# Patient Record
Sex: Female | Born: 1970 | Race: Asian | Hispanic: No | Marital: Married | Smoking: Never smoker
Health system: Southern US, Community
[De-identification: ages and names within clinical notes are randomized; demographics above are authoritative.]

## PROBLEM LIST (undated history)

## (undated) DIAGNOSIS — G43909 Migraine, unspecified, not intractable, without status migrainosus: Secondary | ICD-10-CM

## (undated) DIAGNOSIS — D649 Anemia, unspecified: Secondary | ICD-10-CM

## (undated) DIAGNOSIS — Z8619 Personal history of other infectious and parasitic diseases: Secondary | ICD-10-CM

## (undated) DIAGNOSIS — E785 Hyperlipidemia, unspecified: Secondary | ICD-10-CM

## (undated) DIAGNOSIS — T4145XA Adverse effect of unspecified anesthetic, initial encounter: Secondary | ICD-10-CM

## (undated) DIAGNOSIS — K589 Irritable bowel syndrome without diarrhea: Secondary | ICD-10-CM

## (undated) DIAGNOSIS — I10 Essential (primary) hypertension: Secondary | ICD-10-CM

## (undated) DIAGNOSIS — F909 Attention-deficit hyperactivity disorder, unspecified type: Secondary | ICD-10-CM

## (undated) DIAGNOSIS — N390 Urinary tract infection, site not specified: Secondary | ICD-10-CM

## (undated) DIAGNOSIS — T7840XA Allergy, unspecified, initial encounter: Secondary | ICD-10-CM

## (undated) DIAGNOSIS — J45909 Unspecified asthma, uncomplicated: Secondary | ICD-10-CM

## (undated) DIAGNOSIS — M1990 Unspecified osteoarthritis, unspecified site: Secondary | ICD-10-CM

## (undated) HISTORY — DX: Migraine, unspecified, not intractable, without status migrainosus: G43.909

## (undated) HISTORY — DX: Urinary tract infection, site not specified: N39.0

## (undated) HISTORY — DX: Personal history of other infectious and parasitic diseases: Z86.19

## (undated) HISTORY — DX: Allergy, unspecified, initial encounter: T78.40XA

## (undated) HISTORY — PX: ANAL FISSURE REPAIR: SHX2312

## (undated) HISTORY — DX: Essential (primary) hypertension: I10

## (undated) HISTORY — DX: Unspecified osteoarthritis, unspecified site: M19.90

## (undated) HISTORY — DX: Anemia, unspecified: D64.9

## (undated) HISTORY — PX: GYNECOLOGIC CRYOSURGERY: SHX857

## (undated) HISTORY — DX: Irritable bowel syndrome, unspecified: K58.9

## (undated) HISTORY — DX: Attention-deficit hyperactivity disorder, unspecified type: F90.9

## (undated) HISTORY — PX: WISDOM TOOTH EXTRACTION: SHX21

## (undated) HISTORY — PX: EYE SURGERY: SHX253

---
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
## 2009-08-04 LAB — HM MAMMOGRAPHY: HM Mammogram: NEGATIVE

## 2011-12-12 ENCOUNTER — Encounter: Payer: Self-pay | Admitting: Internal Medicine

## 2012-01-08 ENCOUNTER — Other Ambulatory Visit: Payer: Self-pay | Admitting: Obstetrics & Gynecology

## 2012-01-08 DIAGNOSIS — Z1231 Encounter for screening mammogram for malignant neoplasm of breast: Secondary | ICD-10-CM

## 2012-01-09 LAB — HM PAP SMEAR

## 2012-01-14 ENCOUNTER — Encounter: Payer: Self-pay | Admitting: Internal Medicine

## 2012-01-14 ENCOUNTER — Ambulatory Visit (INDEPENDENT_AMBULATORY_CARE_PROVIDER_SITE_OTHER): Payer: 59 | Admitting: Internal Medicine

## 2012-01-14 VITALS — BP 112/70 | HR 72 | Temp 98.1°F | Resp 18 | Ht 60.0 in | Wt 156.0 lb

## 2012-01-14 DIAGNOSIS — Z0000 Encounter for general adult medical examination without abnormal findings: Secondary | ICD-10-CM

## 2012-01-14 DIAGNOSIS — J45909 Unspecified asthma, uncomplicated: Secondary | ICD-10-CM

## 2012-01-14 DIAGNOSIS — J4540 Moderate persistent asthma, uncomplicated: Secondary | ICD-10-CM | POA: Insufficient documentation

## 2012-01-14 LAB — COMPREHENSIVE METABOLIC PANEL
Albumin: 3.9 g/dL (ref 3.5–5.2)
Alkaline Phosphatase: 50 U/L (ref 39–117)
BUN: 5 mg/dL — ABNORMAL LOW (ref 6–23)
Glucose, Bld: 82 mg/dL (ref 70–99)
Potassium: 3.9 mEq/L (ref 3.5–5.1)

## 2012-01-14 LAB — LIPID PANEL
HDL: 41.2 mg/dL (ref >39.00)
Triglycerides: 256 mg/dL — ABNORMAL HIGH (ref 0.0–149.0)

## 2012-01-14 LAB — TSH: TSH: 1.89 u[IU]/mL (ref 0.35–5.50)

## 2012-01-14 LAB — LDL CHOLESTEROL, DIRECT: Direct LDL: 123.9 mg/dL

## 2012-01-14 MED ORDER — LEVALBUTEROL TARTRATE 45 MCG/ACT IN AERO: 1.0000 | INHALATION_SPRAY | RESPIRATORY_TRACT | Status: DC | PRN

## 2012-01-14 NOTE — Progress Notes (Signed)
  Subjective:    Patient ID: Marissa Horn, female    DOB: September 06, 1970, 41 y.o.   MRN: [id number]  HPI  41 year old who is seen today to establish with our practice. For past medical history is fairly unremarkable. She does have a history of mild episodic asthma. She states that she has one or 2 episodes per year that requires inhalational bronchodilators and off on steroid therapy. She has had a recent gynecologic examination and was noted to have iron deficiency anemia secondary to menorrhagia She has a prior history of migraine headaches but this has not been bothersome in some time. She has a history of MVP and occasional palpitations. She is a gravida 3 para 2 abortus 1 with 2 sons age 40 and 99 Social history married nonsmoker she is a Museum/gallery exhibitions officer by training. Her husband is employed with [employer] as a pediatric ID physician Family history both parents are age approximately 56 with dyslipidemia father has coronary artery disease status post stent placement one brother also dyslipidemia one sister with hypothyroidism    Review of Systems  Constitutional: Negative.   HENT: Negative for hearing loss, congestion, sore throat, rhinorrhea, dental problem, sinus pressure and tinnitus.   Eyes: Negative for pain, discharge and visual disturbance.  Respiratory: Negative for cough and shortness of breath.   Cardiovascular: Positive for palpitations. Negative for chest pain and leg swelling.  Gastrointestinal: Negative for nausea, vomiting, abdominal pain, diarrhea, constipation, blood in stool and abdominal distention.  Genitourinary: Negative for dysuria, urgency, frequency, hematuria, flank pain, vaginal bleeding, vaginal discharge, difficulty urinating, vaginal pain and pelvic pain.  Musculoskeletal: Negative for joint swelling, arthralgias and gait problem.  Skin: Negative for rash.  Neurological: Negative for dizziness, syncope, speech difficulty, weakness, numbness and headaches.    Hematological: Negative for adenopathy.  Psychiatric/Behavioral: Negative for behavioral problems, dysphoric mood and agitation. The patient is not nervous/anxious.        Objective:   Physical Exam  Constitutional: She is oriented to person, place, and time. She appears well-developed and well-nourished.       Weight 156 Blood pressure low normal  HENT:  Head: Normocephalic.  Right Ear: External ear normal.  Left Ear: External ear normal.  Mouth/Throat: Oropharynx is clear and moist.  Eyes: Conjunctivae and EOM are normal. Pupils are equal, round, and reactive to light.  Neck: Normal range of motion. Neck supple. No thyromegaly present.  Cardiovascular: Normal rate, regular rhythm, normal heart sounds and intact distal pulses.   No murmur heard. Pulmonary/Chest: Effort normal and breath sounds normal.  Abdominal: Soft. Bowel sounds are normal. She exhibits no mass. There is no tenderness.  Musculoskeletal: Normal range of motion.  Lymphadenopathy:    She has no cervical adenopathy.  Neurological: She is alert and oriented to person, place, and time.  Skin: Skin is warm and dry. No rash noted.  Psychiatric: She has a normal mood and affect. Her behavior is normal.          Assessment & Plan:   Preventive health examination Mild episodic asthma. We'll continue Xopenex when necessary Mitral prolapse with occasional palpitations. History of essential tremor History of iron deficiency anemia History of mild dyslipidemia  Will check laboratory studies to include a lipid profile Exercise regimen and modest weight loss encouraged

## 2012-01-14 NOTE — Patient Instructions (Signed)
It is important that you exercise regularly, at least 20 minutes 3 to 4 times per week.  If you develop chest pain or shortness of breath seek  medical attention.  You need to lose weight.  Consider a lower calorie diet and regular exercise.  Call or return to clinic prn if these symptoms worsen or fail to improve as anticipated.  

## 2012-01-23 ENCOUNTER — Ambulatory Visit
Admission: RE | Admit: 2012-01-23 | Discharge: 2012-01-23 | Disposition: A | Discharge status: Home / Self Care | Payer: 59 | Source: Ambulatory Visit | Attending: Obstetrics & Gynecology | Admitting: Obstetrics & Gynecology

## 2012-01-23 DIAGNOSIS — Z1231 Encounter for screening mammogram for malignant neoplasm of breast: Secondary | ICD-10-CM

## 2012-01-30 LAB — HM MAMMOGRAPHY: HM Mammogram: NORMAL

## 2012-03-12 ENCOUNTER — Telehealth: Payer: Self-pay | Admitting: Internal Medicine

## 2012-03-12 NOTE — Telephone Encounter (Signed)
Pt called and said that she never rcvd a call re: her labs results from 01/14/12. Pls call.

## 2012-03-15 NOTE — Telephone Encounter (Signed)
Spoke with pt- informed labs stable - copy sent to pt as requested    [address] [place] , [place] [phone]

## 2012-04-21 ENCOUNTER — Encounter (HOSPITAL_COMMUNITY): Payer: Self-pay | Admitting: Pharmacist

## 2012-04-27 ENCOUNTER — Encounter (HOSPITAL_COMMUNITY): Payer: Self-pay

## 2012-04-27 ENCOUNTER — Encounter (HOSPITAL_COMMUNITY)
Admission: RE | Admit: 2012-04-27 | Discharge: 2012-04-27 | Disposition: A | Discharge status: Home / Self Care | Payer: 59 | Source: Ambulatory Visit | Attending: Obstetrics & Gynecology | Admitting: Obstetrics & Gynecology

## 2012-04-27 HISTORY — DX: Unspecified asthma, uncomplicated: J45.909

## 2012-04-27 HISTORY — DX: Adverse effect of unspecified anesthetic, initial encounter: T41.45XA

## 2012-04-27 LAB — CBC
MCH: 26.5 pg (ref 26.0–34.0)
MCHC: 31.3 g/dL (ref 30.0–36.0)
MCV: 84.7 fL (ref 78.0–100.0)
Platelets: 335 10*3/uL (ref 150–400)
RDW: 15.1 % (ref 11.5–15.5)

## 2012-04-27 NOTE — Patient Instructions (Addendum)
Your procedure is scheduled on:05/04/12  Enter through the Main Entrance at :6AM Pick up desk phone and dial [phone] and inform us of your arrival.  Please call [phone] if you have any problems the morning of surgery.  Remember: Do not eat after midnight monday Do not drink after:midnight Monday  Take these meds the morning of surgery with a sip of water:none....bring inhaler to hospital  DO NOT wear jewelry, eye make-up, lipstick,body lotion, or dark fingernail polish. Do not shave for 48 hours prior to surgery.  If you are to be admitted after surgery, leave suitcase in car until your room has been assigned. Patients discharged on the day of surgery will not be allowed to drive home.   Remember to use your Hibiclens as instructed.

## 2012-05-03 DIAGNOSIS — N920 Excessive and frequent menstruation with regular cycle: Secondary | ICD-10-CM | POA: Diagnosis present

## 2012-05-03 DIAGNOSIS — N840 Polyp of corpus uteri: Secondary | ICD-10-CM | POA: Diagnosis present

## 2012-05-03 NOTE — H&P (Signed)
Marissa Horn is an 41 y.o. female G3P2 married [place] female with history of menorrhagia that has been treated unsuccessfully with both oral contraceptives and a Mirena IUD.  She underwent an in office ultrasound on 01/21/12 as well as an endometrial biopsy.  The biopsy showed benign tissue.  She has decided to proceed with definitve treatment but has been counseled about alternative treatments including progesterone use and  endometrial ablation.  Risks, benefits have all been discussed and are documented in my office chart.  Patient also has history of overactive bladder and has seen Dr Sherron Monday for consultation.  She is now on oral medications with significant improvement in symptoms.  She also wanted to consider abdominoplasty in combination with the hysterectomy. She has seen Dr. Etter Sjogren who will be performing a combined surgcal case.  Pertinent Gynecological History: Menses: regular but heavy Bleeding: menorrhagia Contraception: condoms DES exposure: denies Blood transfusions: none Sexually transmitted diseases: no past history Previous GYN Procedures: IUD placement and removal  Last mammogram: normal Date: 7/13 Last pap: normal Date: 6/13 OB History: G3, P2   Menstrual History: Menarche age: 75 No LMP recorded.    Past Medical History  Diagnosis Date  . Allergy   . Arthritis   . Hyperlipidemia   . UTI (lower urinary tract infection)   . History of chicken pox   . Migraine   . Asthma     inhaler use as needed   . Shortness of breath     when having asthma attack  . Complication of anesthesia 1990    muscle relaxant caused lung to collapse  . Difficult intubation 1990    Past Surgical History  Procedure Date  . Eye surgery   . Anal fissure repair   . Wisdom tooth extraction     Family History  Problem Relation Age of Onset  . Arthritis Mother   . Hyperlipidemia Mother   . Arthritis Father   . Hyperlipidemia Father   . Heart disease Father   . Thyroid  disease Sister   . Hyperlipidemia Brother     Social History:  reports that she has never smoked. She has never used smokeless tobacco. She reports that she does not drink alcohol or use illicit drugs.  Allergies:  Allergies  Allergen Reactions  . Scopolamine Anaphylaxis  . Biaxin (Clarithromycin) Nausea And Vomiting    Projectile vomiting  . Tamiflu (Oseltamivir Phosphate) Nausea And Vomiting    Projectile vomiting  . Asa (Aspirin) Rash    Pt takes ibuprofen without problems    No prescriptions prior to admission    Review of Systems  Constitutional: Negative for fever and chills.  Eyes: Negative for blurred vision.  Respiratory: Negative for cough.   Cardiovascular: Negative for chest pain.  Gastrointestinal: Negative for heartburn, nausea, vomiting and abdominal pain.  Genitourinary: Negative for dysuria and urgency.  Musculoskeletal: Negative for myalgias.  Skin: Negative for itching and rash.  Neurological: Negative for dizziness and headaches.  Endo/Heme/Allergies: Does not bruise/bleed easily.  Psychiatric/Behavioral: Negative for depression.    There were no vitals taken for this visit. Physical Exam  Vitals reviewed. Constitutional: She is oriented to person, place, and time. She appears well-developed.  HENT:  Head: Normocephalic and atraumatic.  Neck: Normal range of motion. Neck supple.  Cardiovascular: Normal rate, regular rhythm and normal heart sounds.   Respiratory: Effort normal and breath sounds normal.  GI: Soft. Bowel sounds are normal. She exhibits no distension and no mass. There is no tenderness.  There is no rebound and no guarding.  Musculoskeletal: Normal range of motion.  Neurological: She is alert and oriented to person, place, and time.  Skin: Skin is warm and dry.  Psychiatric: She has a normal mood and affect.    No results found for this or any previous visit (from the past 24 hour(s)).  No results found.  Assessment/Plan: Jacci  Amos is a 41 year old G3P2 MWF with menorrhagia here for definitive treatment with robotic assisted TLH/possible BSO.  Risks and benefits have been discussed and are all documented in my office charge.  Patient here and ready to proceed.  Dr. Odis Luster will also be performing an abdominoplasty at the same time.  Valentina Shaggy [name] 05/03/2012, 10:20 PM

## 2012-05-04 ENCOUNTER — Ambulatory Visit (HOSPITAL_COMMUNITY)
Admission: RE | Admit: 2012-05-04 | Discharge: 2012-05-05 | Disposition: A | Discharge status: Home / Self Care | Payer: 59 | Source: Ambulatory Visit | Attending: Obstetrics & Gynecology | Admitting: Obstetrics & Gynecology

## 2012-05-04 ENCOUNTER — Ambulatory Visit (HOSPITAL_COMMUNITY): Payer: 59 | Admitting: Anesthesiology

## 2012-05-04 ENCOUNTER — Ambulatory Visit (HOSPITAL_COMMUNITY)
Admission: RE | Admit: 2012-05-04 | Discharge: 2012-05-04 | Disposition: A | Discharge status: Home / Self Care | Payer: Self-pay | Source: Ambulatory Visit | Attending: Plastic Surgery | Admitting: Plastic Surgery

## 2012-05-04 ENCOUNTER — Encounter (HOSPITAL_COMMUNITY): Payer: Self-pay

## 2012-05-04 ENCOUNTER — Encounter (HOSPITAL_COMMUNITY): Payer: Self-pay | Admitting: Anesthesiology

## 2012-05-04 ENCOUNTER — Encounter (HOSPITAL_COMMUNITY)
Admission: RE | Disposition: A | Discharge status: Home / Self Care | Payer: Self-pay | Source: Ambulatory Visit | Attending: Obstetrics & Gynecology

## 2012-05-04 DIAGNOSIS — N3281 Overactive bladder: Secondary | ICD-10-CM | POA: Diagnosis present

## 2012-05-04 DIAGNOSIS — J45909 Unspecified asthma, uncomplicated: Secondary | ICD-10-CM

## 2012-05-04 DIAGNOSIS — N840 Polyp of corpus uteri: Secondary | ICD-10-CM | POA: Diagnosis present

## 2012-05-04 DIAGNOSIS — L909 Atrophic disorder of skin, unspecified: Secondary | ICD-10-CM | POA: Insufficient documentation

## 2012-05-04 DIAGNOSIS — D649 Anemia, unspecified: Secondary | ICD-10-CM | POA: Diagnosis present

## 2012-05-04 DIAGNOSIS — N920 Excessive and frequent menstruation with regular cycle: Secondary | ICD-10-CM | POA: Diagnosis present

## 2012-05-04 DIAGNOSIS — L919 Hypertrophic disorder of the skin, unspecified: Secondary | ICD-10-CM | POA: Insufficient documentation

## 2012-05-04 HISTORY — PX: OTHER SURGICAL HISTORY: SHX169

## 2012-05-04 HISTORY — PX: ABDOMINAL HYSTERECTOMY: SHX81

## 2012-05-04 HISTORY — PX: ABDOMINOPLASTY: SHX5355

## 2012-05-04 SURGERY — ROBOTIC ASSISTED TOTAL HYSTERECTOMY
Anesthesia: General | Site: Abdomen | Wound class: Clean Contaminated

## 2012-05-04 MED ORDER — ROCURONIUM BROMIDE 100 MG/10ML IV SOLN
INTRAVENOUS | Status: DC | PRN
  Administered 2012-05-04: 30 mg via INTRAVENOUS
  Administered 2012-05-04: 50 mg via INTRAVENOUS

## 2012-05-04 MED ORDER — FENTANYL CITRATE 0.05 MG/ML IJ SOLN
25.0000 ug | INTRAMUSCULAR | Status: DC | PRN
  Administered 2012-05-04 (×2): 50 ug via INTRAVENOUS
  Administered 2012-05-04 (×2): 25 ug via INTRAVENOUS

## 2012-05-04 MED ORDER — EPHEDRINE SULFATE 50 MG/ML IJ SOLN
INTRAMUSCULAR | Status: DC | PRN
  Administered 2012-05-04: 10 mg via INTRAVENOUS

## 2012-05-04 MED ORDER — ROPIVACAINE HCL 5 MG/ML IJ SOLN
INTRAMUSCULAR | Status: DC | PRN
  Administered 2012-05-04: 80 mL

## 2012-05-04 MED ORDER — PROPOFOL 10 MG/ML IV EMUL
INTRAVENOUS | Status: DC | PRN
  Administered 2012-05-04: 200 mg via INTRAVENOUS

## 2012-05-04 MED ORDER — LACTATED RINGERS IR SOLN
Status: DC | PRN
  Administered 2012-05-04: 3000 mL

## 2012-05-04 MED ORDER — FENTANYL CITRATE 0.05 MG/ML IJ SOLN
INTRAMUSCULAR | Status: DC | PRN
  Administered 2012-05-04: 50 ug via INTRAVENOUS
  Administered 2012-05-04: 100 ug via INTRAVENOUS
  Administered 2012-05-04 (×2): 50 ug via INTRAVENOUS

## 2012-05-04 MED ORDER — LEVALBUTEROL TARTRATE 45 MCG/ACT IN AERO: 1.0000 | INHALATION_SPRAY | RESPIRATORY_TRACT | Status: DC | PRN

## 2012-05-04 MED ORDER — PANTOPRAZOLE SODIUM 40 MG IV SOLR
40.0000 mg | Freq: Every day | INTRAVENOUS | Status: DC
  Administered 2012-05-04: 40 mg via INTRAVENOUS
  Filled 2012-05-04 (×2): qty 40

## 2012-05-04 MED ORDER — INDIGOTINDISULFONATE SODIUM 8 MG/ML IJ SOLN
INTRAMUSCULAR | Status: AC
  Filled 2012-05-04: qty 5

## 2012-05-04 MED ORDER — INDIGOTINDISULFONATE SODIUM 8 MG/ML IJ SOLN
INTRAMUSCULAR | Status: DC | PRN
  Administered 2012-05-04: 40 mg via INTRAVENOUS

## 2012-05-04 MED ORDER — CEFAZOLIN SODIUM-DEXTROSE 2-3 GM-% IV SOLR: 2.0000 g | INTRAVENOUS | Status: DC

## 2012-05-04 MED ORDER — FENTANYL CITRATE 0.05 MG/ML IJ SOLN
INTRAMUSCULAR | Status: AC
  Administered 2012-05-04: 50 ug via INTRAVENOUS
  Filled 2012-05-04: qty 2

## 2012-05-04 MED ORDER — CEFAZOLIN SODIUM-DEXTROSE 2-3 GM-% IV SOLR
INTRAVENOUS | Status: AC
  Administered 2012-05-04: 2 g via INTRAVENOUS
  Filled 2012-05-04: qty 50

## 2012-05-04 MED ORDER — ONDANSETRON HCL 4 MG/2ML IJ SOLN: 4.0000 mg | Freq: Four times a day (QID) | INTRAMUSCULAR | Status: DC | PRN

## 2012-05-04 MED ORDER — LACTATED RINGERS IV SOLN
INTRAVENOUS | Status: DC
  Administered 2012-05-04: 10:00:00 via INTRAVENOUS
  Administered 2012-05-04: 1000 mL via INTRAVENOUS
  Administered 2012-05-04: 08:00:00 via INTRAVENOUS

## 2012-05-04 MED ORDER — HYDROMORPHONE HCL PF 1 MG/ML IJ SOLN
INTRAMUSCULAR | Status: AC
  Filled 2012-05-04: qty 1

## 2012-05-04 MED ORDER — HYDROMORPHONE HCL PF 1 MG/ML IJ SOLN
INTRAMUSCULAR | Status: DC | PRN
  Administered 2012-05-04: 1 mg via INTRAVENOUS

## 2012-05-04 MED ORDER — SODIUM CHLORIDE 0.9 % IJ SOLN: 9.0000 mL | INTRAMUSCULAR | Status: DC | PRN

## 2012-05-04 MED ORDER — SIMETHICONE 80 MG PO CHEW: 80.0000 mg | CHEWABLE_TABLET | Freq: Four times a day (QID) | ORAL | Status: DC | PRN

## 2012-05-04 MED ORDER — DIPHENHYDRAMINE HCL 12.5 MG/5ML PO ELIX: 12.5000 mg | ORAL_SOLUTION | Freq: Four times a day (QID) | ORAL | Status: DC | PRN

## 2012-05-04 MED ORDER — PROPOFOL 10 MG/ML IV EMUL
INTRAVENOUS | Status: AC
  Filled 2012-05-04: qty 20

## 2012-05-04 MED ORDER — NEOSTIGMINE METHYLSULFATE 1 MG/ML IJ SOLN
INTRAMUSCULAR | Status: AC
  Filled 2012-05-04: qty 10

## 2012-05-04 MED ORDER — OXYCODONE-ACETAMINOPHEN 5-325 MG PO TABS: 1.0000 | ORAL_TABLET | ORAL | Status: DC | PRN

## 2012-05-04 MED ORDER — MENTHOL 3 MG MT LOZG: 1.0000 | LOZENGE | OROMUCOSAL | Status: DC | PRN

## 2012-05-04 MED ORDER — PHENYLEPHRINE 40 MCG/ML (10ML) SYRINGE FOR IV PUSH (FOR BLOOD PRESSURE SUPPORT)
PREFILLED_SYRINGE | INTRAVENOUS | Status: AC
  Filled 2012-05-04: qty 5

## 2012-05-04 MED ORDER — DIPHENHYDRAMINE HCL 50 MG/ML IJ SOLN: 12.5000 mg | Freq: Four times a day (QID) | INTRAMUSCULAR | Status: DC | PRN

## 2012-05-04 MED ORDER — NALOXONE HCL 0.4 MG/ML IJ SOLN: 0.4000 mg | INTRAMUSCULAR | Status: DC | PRN

## 2012-05-04 MED ORDER — MIDAZOLAM HCL 2 MG/2ML IJ SOLN
INTRAMUSCULAR | Status: AC
  Filled 2012-05-04: qty 2

## 2012-05-04 MED ORDER — ENOXAPARIN SODIUM 40 MG/0.4ML ~~LOC~~ SOLN
40.0000 mg | SUBCUTANEOUS | Status: DC
  Administered 2012-05-05: 40 mg via SUBCUTANEOUS
  Filled 2012-05-04 (×3): qty 0.4

## 2012-05-04 MED ORDER — HYDROMORPHONE 0.3 MG/ML IV SOLN
INTRAVENOUS | Status: DC
Start: 2012-05-04 — End: 2012-05-05
  Administered 2012-05-04: 0.3 mg via INTRAVENOUS
  Administered 2012-05-04: 15:00:00 via INTRAVENOUS
  Administered 2012-05-04: 1.8 mg via INTRAVENOUS
  Administered 2012-05-05 (×2): 0.6 mg via INTRAVENOUS
  Filled 2012-05-04: qty 25

## 2012-05-04 MED ORDER — DEXTROSE-NACL 5-0.45 % IV SOLN
INTRAVENOUS | Status: DC
  Administered 2012-05-05: 03:00:00 via INTRAVENOUS

## 2012-05-04 MED ORDER — ACETAMINOPHEN 10 MG/ML IV SOLN: 1000.0000 mg | Freq: Once | INTRAVENOUS | Status: DC

## 2012-05-04 MED ORDER — CEFAZOLIN SODIUM 1-5 GM-% IV SOLN
1.0000 g | Freq: Three times a day (TID) | INTRAVENOUS | Status: DC
  Administered 2012-05-04 – 2012-05-05 (×3): 1 g via INTRAVENOUS
  Filled 2012-05-04 (×4): qty 50

## 2012-05-04 MED ORDER — BACITRACIN-NEOMYCIN-POLYMYXIN 400-5-5000 EX OINT
TOPICAL_OINTMENT | CUTANEOUS | Status: AC
  Filled 2012-05-04: qty 1

## 2012-05-04 MED ORDER — ROPIVACAINE HCL 5 MG/ML IJ SOLN
INTRAMUSCULAR | Status: AC
  Filled 2012-05-04: qty 60

## 2012-05-04 MED ORDER — FENTANYL CITRATE 0.05 MG/ML IJ SOLN
INTRAMUSCULAR | Status: AC
  Administered 2012-05-04: 25 ug via INTRAVENOUS
  Filled 2012-05-04: qty 2

## 2012-05-04 MED ORDER — MIDAZOLAM HCL 5 MG/5ML IJ SOLN
INTRAMUSCULAR | Status: DC | PRN
  Administered 2012-05-04: 2 mg via INTRAVENOUS

## 2012-05-04 MED ORDER — MEPERIDINE HCL 25 MG/ML IJ SOLN: 6.2500 mg | INTRAMUSCULAR | Status: DC | PRN

## 2012-05-04 MED ORDER — ONDANSETRON HCL 4 MG/2ML IJ SOLN
INTRAMUSCULAR | Status: AC
  Filled 2012-05-04: qty 2

## 2012-05-04 MED ORDER — NEOSTIGMINE METHYLSULFATE 1 MG/ML IJ SOLN
INTRAMUSCULAR | Status: DC | PRN
  Administered 2012-05-04: 3 mg via INTRAVENOUS

## 2012-05-04 MED ORDER — ENOXAPARIN SODIUM 40 MG/0.4ML ~~LOC~~ SOLN
40.0000 mg | SUBCUTANEOUS | Status: DC
  Filled 2012-05-04: qty 0.4

## 2012-05-04 MED ORDER — ONDANSETRON HCL 4 MG/2ML IJ SOLN
INTRAMUSCULAR | Status: DC | PRN
  Administered 2012-05-04: 4 mg via INTRAVENOUS

## 2012-05-04 MED ORDER — ENOXAPARIN SODIUM 40 MG/0.4ML ~~LOC~~ SOLN
40.0000 mg | SUBCUTANEOUS | Status: AC
  Administered 2012-05-04: 40 mg via SUBCUTANEOUS
  Filled 2012-05-04: qty 0.4

## 2012-05-04 MED ORDER — METHOCARBAMOL 500 MG PO TABS
500.0000 mg | ORAL_TABLET | Freq: Four times a day (QID) | ORAL | Status: DC
  Administered 2012-05-04 – 2012-05-05 (×2): 500 mg via ORAL
  Filled 2012-05-04 (×8): qty 1

## 2012-05-04 MED ORDER — BUPIVACAINE HCL (PF) 0.25 % IJ SOLN
INTRAMUSCULAR | Status: AC
  Filled 2012-05-04: qty 30

## 2012-05-04 MED ORDER — EPHEDRINE 5 MG/ML INJ
INTRAVENOUS | Status: AC
  Filled 2012-05-04: qty 10

## 2012-05-04 MED ORDER — FENTANYL CITRATE 0.05 MG/ML IJ SOLN
INTRAMUSCULAR | Status: AC
  Filled 2012-05-04: qty 5

## 2012-05-04 MED ORDER — LIDOCAINE HCL (CARDIAC) 20 MG/ML IV SOLN
INTRAVENOUS | Status: DC | PRN
  Administered 2012-05-04: 100 mg via INTRAVENOUS

## 2012-05-04 MED ORDER — ENOXAPARIN (LOVENOX) PATIENT EDUCATION KIT
PACK | Freq: Once | Status: DC
  Filled 2012-05-04: qty 1

## 2012-05-04 MED ORDER — LIDOCAINE HCL (CARDIAC) 20 MG/ML IV SOLN
INTRAVENOUS | Status: AC
  Filled 2012-05-04: qty 5

## 2012-05-04 MED ORDER — GLYCOPYRROLATE 0.2 MG/ML IJ SOLN
INTRAMUSCULAR | Status: DC | PRN
  Administered 2012-05-04: 0.6 mg via INTRAVENOUS
  Administered 2012-05-04: 0.2 mg via INTRAVENOUS

## 2012-05-04 MED ORDER — FENTANYL CITRATE 0.05 MG/ML IJ SOLN
INTRAMUSCULAR | Status: AC
  Filled 2012-05-04: qty 2

## 2012-05-04 MED ORDER — ARTIFICIAL TEARS OP OINT
TOPICAL_OINTMENT | OPHTHALMIC | Status: AC
  Filled 2012-05-04: qty 3.5

## 2012-05-04 MED ORDER — STERILE WATER FOR IRRIGATION IR SOLN
Status: DC | PRN
  Administered 2012-05-04: 1000 mL via INTRAVESICAL

## 2012-05-04 MED ORDER — BUPIVACAINE HCL (PF) 0.25 % IJ SOLN
INTRAMUSCULAR | Status: AC
  Filled 2012-05-04: qty 270

## 2012-05-04 MED ORDER — GLYCOPYRROLATE 0.2 MG/ML IJ SOLN
INTRAMUSCULAR | Status: AC
  Filled 2012-05-04: qty 3

## 2012-05-04 MED ORDER — PROMETHAZINE HCL 25 MG/ML IJ SOLN
12.5000 mg | Freq: Four times a day (QID) | INTRAMUSCULAR | Status: DC | PRN
  Administered 2012-05-04: 12.5 mg via INTRAVENOUS
  Filled 2012-05-04: qty 1

## 2012-05-04 MED ORDER — PHENYLEPHRINE HCL 10 MG/ML IJ SOLN
INTRAMUSCULAR | Status: DC | PRN
  Administered 2012-05-04: 100 ug via INTRAVENOUS

## 2012-05-04 MED ORDER — ROCURONIUM BROMIDE 50 MG/5ML IV SOLN
INTRAVENOUS | Status: AC
  Filled 2012-05-04: qty 2

## 2012-05-04 MED ORDER — ACETAMINOPHEN 325 MG PO TABS: 650.0000 mg | ORAL_TABLET | ORAL | Status: DC | PRN

## 2012-05-04 SURGICAL SUPPLY — 87 items
ATCH SMKEVC FLXB CAUT HNDSWH (FILTER) ×2 IMPLANT
BARRIER ADHS 3X4 INTERCEED (GAUZE/BANDAGES/DRESSINGS) ×3 IMPLANT
BINDER ABD UNIV 10 28-50 (GAUZE/BANDAGES/DRESSINGS) IMPLANT
BINDER ABDOM UNIV 10 (GAUZE/BANDAGES/DRESSINGS)
BLADE EXTENDED COATED 6.5IN (ELECTRODE) ×3 IMPLANT
BLADE HEX COATED 2.75 (ELECTRODE) IMPLANT
BLADE SURG 10 STRL SS (BLADE) ×6 IMPLANT
BLADE SURG 15 STRL LF C SS BP (BLADE) ×2 IMPLANT
BLADE SURG 15 STRL SS (BLADE) ×1
CABLE HIGH FREQUENCY MONO STRZ (ELECTRODE) ×3 IMPLANT
CATH FOLEY 2WAY SLVR  5CC 14FR (CATHETERS) ×1
CATH FOLEY 2WAY SLVR 5CC 14FR (CATHETERS) ×2 IMPLANT
CHLORAPREP W/TINT 26ML (MISCELLANEOUS) ×9 IMPLANT
CLOTH BEACON ORANGE TIMEOUT ST (SAFETY) ×3 IMPLANT
CONT PATH 16OZ SNAP LID 3702 (MISCELLANEOUS) ×6 IMPLANT
COVER MAYO STAND STRL (DRAPES) ×3 IMPLANT
COVER TABLE BACK 60X90 (DRAPES) ×6 IMPLANT
COVER TIP SHEARS 8 DVNC (MISCELLANEOUS) ×2 IMPLANT
COVER TIP SHEARS 8MM DA VINCI (MISCELLANEOUS) ×1
DECANTER SPIKE VIAL GLASS SM (MISCELLANEOUS) ×3 IMPLANT
DERMABOND ADVANCED (GAUZE/BANDAGES/DRESSINGS) ×2
DERMABOND ADVANCED .7 DNX12 (GAUZE/BANDAGES/DRESSINGS) ×4 IMPLANT
DRAIN CHANNEL 19F RND (DRAIN) ×3 IMPLANT
DRAPE HUG U DISPOSABLE (DRAPE) ×3 IMPLANT
DRAPE LAPAROSCOPIC ABDOMINAL (DRAPES) ×3 IMPLANT
DRAPE LG THREE QUARTER DISP (DRAPES) ×6 IMPLANT
DRAPE PROXIMA HALF (DRAPES) ×12 IMPLANT
DRAPE WARM FLUID 44X44 (DRAPE) ×3 IMPLANT
DRSG XEROFORM 1X8 (GAUZE/BANDAGES/DRESSINGS) ×3 IMPLANT
ELECT CAUTERY BLADE 6.4 (BLADE) ×3 IMPLANT
ELECT REM PT RETURN 9FT ADLT (ELECTROSURGICAL) ×3
ELECTRODE REM PT RTRN 9FT ADLT (ELECTROSURGICAL) ×2 IMPLANT
EVACUATOR SILICONE 100CC (DRAIN) ×3 IMPLANT
EVACUATOR SMOKE ACCUVAC VALLEY (FILTER) ×1
GLOVE BIO SURGEON STRL SZ7.5 (GLOVE) ×3 IMPLANT
GLOVE BIOGEL PI IND STRL 7.0 (GLOVE) ×20 IMPLANT
GLOVE BIOGEL PI IND STRL 8 (GLOVE) ×2 IMPLANT
GLOVE BIOGEL PI INDICATOR 7.0 (GLOVE) ×10
GLOVE BIOGEL PI INDICATOR 8 (GLOVE) ×1
GLOVE ECLIPSE 6.5 STRL STRAW (GLOVE) ×12 IMPLANT
GLOVE NEODERM STER SZ 7 (GLOVE) ×3 IMPLANT
GLOVE SURG SS PI 7.0 STRL IVOR (GLOVE) ×15 IMPLANT
GOWN STRL REIN XL XLG (GOWN DISPOSABLE) ×33 IMPLANT
KIT ACCESSORY DA VINCI DISP (KITS) ×1
KIT ACCESSORY DVNC DISP (KITS) ×2 IMPLANT
LEGGING LITHOTOMY PAIR STRL (DRAPES) ×9 IMPLANT
NEEDLE INSUFFLATION 14GA 120MM (NEEDLE) ×3 IMPLANT
NS IRRIG 1000ML POUR BTL (IV SOLUTION) ×9 IMPLANT
OCCLUDER COLPOPNEUMO (BALLOONS) ×3 IMPLANT
PACK ABDOMINAL GYN (CUSTOM PROCEDURE TRAY) ×3 IMPLANT
PACK LAVH (CUSTOM PROCEDURE TRAY) ×3 IMPLANT
PAD ABD 7.5X8 STRL (GAUZE/BANDAGES/DRESSINGS) ×3 IMPLANT
PAD PREP 24X48 CUFFED NSTRL (MISCELLANEOUS) ×6 IMPLANT
PLUG CATH AND CAP STER (CATHETERS) ×3 IMPLANT
PROTECTOR NERVE ULNAR (MISCELLANEOUS) ×6 IMPLANT
SET CYSTO W/LG BORE CLAMP LF (SET/KITS/TRAYS/PACK) ×3 IMPLANT
SET IRRIG TUBING LAPAROSCOPIC (IRRIGATION / IRRIGATOR) ×3 IMPLANT
SOLUTION ELECTROLUBE (MISCELLANEOUS) ×3 IMPLANT
SPONGE LAP 18X18 X RAY DECT (DISPOSABLE) ×9 IMPLANT
STRIP CLOSURE SKIN 1/2X4 (GAUZE/BANDAGES/DRESSINGS) IMPLANT
STRIP CLOSURE SKIN 1/4X4 (GAUZE/BANDAGES/DRESSINGS) IMPLANT
SUT MON AB 2-0 SH 27 (SUTURE) ×9
SUT MON AB 2-0 SH27 (SUTURE) ×18 IMPLANT
SUT PDS AB 0 CT1 36 (SUTURE) ×9 IMPLANT
SUT PDS AB 0 CTX 36 PDP370T (SUTURE) IMPLANT
SUT PROLENE 0 CT 1 30 (SUTURE) ×12 IMPLANT
SUT PROLENE 3 0 PS 1 (SUTURE) ×3 IMPLANT
SUT VIC AB 0 CT1 27 (SUTURE) ×5
SUT VIC AB 0 CT1 27XBRD ANBCTR (SUTURE) ×10 IMPLANT
SUT VICRYL 0 UR6 27IN ABS (SUTURE) ×3 IMPLANT
SUT VICRYL RAPIDE 4/0 PS 2 (SUTURE) ×6 IMPLANT
SYR 30ML LL (SYRINGE) ×3 IMPLANT
SYR 50ML LL SCALE MARK (SYRINGE) ×3 IMPLANT
SYR BULB IRRIGATION 50ML (SYRINGE) ×3 IMPLANT
TAPE CLOTH SURG 4X10 WHT LF (GAUZE/BANDAGES/DRESSINGS) ×3 IMPLANT
TIP UTERINE 6.7X8CM BLUE DISP (MISCELLANEOUS) ×3 IMPLANT
TOWEL OR 17X24 6PK STRL BLUE (TOWEL DISPOSABLE) ×15 IMPLANT
TRAY FOLEY CATH 14FR (SET/KITS/TRAYS/PACK) ×3 IMPLANT
TROCAR DISP BLADELESS 8 DVNC (TROCAR) ×2 IMPLANT
TROCAR DISP BLADELESS 8MM (TROCAR) ×1
TROCAR XCEL NON-BLD 5MMX100MML (ENDOMECHANICALS) ×3 IMPLANT
TROCAR Z-THREAD 12X150 (TROCAR) ×3 IMPLANT
TUBING FILTER THERMOFLATOR (ELECTROSURGICAL) ×3 IMPLANT
TUBING NON-CON 1/4 X 20 CONN (TUBING) ×6 IMPLANT
WARMER LAPAROSCOPE (MISCELLANEOUS) ×3 IMPLANT
WATER STERILE IRR 1000ML POUR (IV SOLUTION) ×9 IMPLANT
YANKAUER SUCT BULB TIP NO VENT (SUCTIONS) ×3 IMPLANT

## 2012-05-04 NOTE — Progress Notes (Signed)
Day of Surgery Procedure(s) (LRB): ROBOTIC ASSISTED TOTAL HYSTERECTOMY (N/A) ABDOMINOPLASTY (N/A) BILATERAL SALPINGECTOMY (Bilateral)  Subjective: Patient reports nausea and incisional pain.  Patient very sleepy right now because she just received Phenergan.  Objective: I have reviewed patient's vital signs, intake and output, medications and labs.  General: sleepy but arousable Resp: clear to auscultation bilaterally Cardio: regular rate and rhythm, S1, S2 normal, no murmur, click, rub or gallop GI: normal findings: soft but quiet and incision: clean, dry and intact Extremities: extremities normal, atraumatic, no cyanosis or edema Vaginal Bleeding: none   Assessment: s/p Procedure(s) (LRB) with comments: ROBOTIC ASSISTED TOTAL HYSTERECTOMY (N/A) ABDOMINOPLASTY (N/A) BILATERAL SALPINGECTOMY (Bilateral): stable  Plan: continue with PCA for pain management, cont IVF/ABX, hopefully will stand at bedside tonight, maintain catheter overnigth  LOS: 0 days    Marissa Horn 05/04/2012, 6:45 PM

## 2012-05-04 NOTE — Transfer of Care (Signed)
Immediate Anesthesia Transfer of Care Note  Patient: Marissa Horn  Procedure(s) Performed: Procedure(s) (LRB) with comments: ROBOTIC ASSISTED TOTAL HYSTERECTOMY (N/A) ABDOMINOPLASTY (N/A) BILATERAL SALPINGECTOMY (Bilateral)  Patient Location: PACU  Anesthesia Type: General  Level of Consciousness: awake, alert  and sedated  Airway & Oxygen Therapy: Patient Spontanous Breathing  Post-op Assessment: Report given to PACU RN and Post -op Vital signs reviewed and stable  Post vital signs: Reviewed and stable  Complications: No apparent anesthesia complications

## 2012-05-04 NOTE — Op Note (Signed)
05/04/2012  10:13 AM  PATIENT:  Marissa Horn  41 y.o. female  PRE-OPERATIVE DIAGNOSIS:  Menorrhagia; anemia; endometrial polyps, desires abdominoplasty  POST-OPERATIVE DIAGNOSIS:  same  PROCEDURE:  Procedure(s): ROBOTIC ASSISTED TOTAL HYSTERECTOMY BILATERAL SALPINGECTOMY CYSTOSCOPY ABDOMINOPLASTY  SURGEON:  Meylin Stenzel [name]  ASSISTANTS: CYNTHIA ROMINE AND TRACY LATHROP   ANESTHESIA:   general, Dr. Rodman Pickle oversaw the case  ESTIMATED BLOOD LOSS:for the initial portion of the procedure:  50cc  BLOOD ADMINISTERED:none   FLUIDS: 1800ccLR  UOP: 200cc clear  SPECIMEN:  Uterus, cervix, bilateral tubes  DISPOSITION OF SPECIMEN:  PATHOLOGY  FINDINGS: normal upper abdomen, normal appendix, normal appearing ovaries and fallopian tubes, globular uterus with possible adenomyosis  DESCRIPTION OF OPERATION: Before the beginning of the procedure, Dr. Odis Luster (plastic surgery) was present to mark the patient in the holding room. Because positioning is different for the 2 procedures we are performing, we both confirmed that the operating room had everything needed for both procedures and the operating room table was appropriate for positioning needed for both cases.  The patient did have informed consent present and on her chart. At this point she was taken to the operating room and initially placed in the supine position. She was initially positioned on a beanbag with her arms tucked at the sides. Her arms were padded at the elbows and at the wrists. While she was awake her legs were also position in the low lithotomy position in [place] stirrups. She had sequential compression devices on her lower extremities which were functioning properly. She was positioned awake to make sure that she was comfortable and had no pressure points.  Once correct positioning was confirmed, general endotracheal anesthesia was administered by the anesthesia staff without difficulty. Dr. Rodman Pickle oversaw case.   Timeout was performed. The abdomen was prepped with chlor prep and the inner thighs, vagina and perineum were prepped with Betadine x3. After 3 minutes past the patient was draped in a normal standard fashion the legs were lifted to the high lithotomy position.  Attention was turned the vagina. A heavy weighted speculum was placed in the posterior aspect of the vagina and the anterior lip of the cervix was visualized and grasped with single-tooth tenaculum. The uterus sounded to 9 cm. The cervix is dilated up to #21 with Shawnie Pons dilators. A RUMI uterine manipulator was obtained. A #8 disposable tip is attached to this manipulator as well as a medium KOH ring. A single stitch of 0 Vicryl was placed the anterior lip of the cervix. The stitch was brought the KOH ring as a means of manipulating and pulling on the uterus at the very end of the procedure to deliver the specimen to the vagina.  The #8 disposable tip and the RUMI uterine manipulator were passed in the endocervical canal and into the endometrial cavity. The balloon tip was inflated. There is excellent fit of the KOH ring around the cervix. The uterus manipulated easily. Of note, a vaginal occlusive device was also on the RUMI uterine manipulator to be used later the case. A Foley catheter is placed to straight drain. Clear urine was noted and the catheter. Legs were lowered to the low lithotomy position.   Attention was then turned to the abdomen. Patient had been marked in the holding area by Dr. Odis Luster. He needed me to stay within the marked areas of the skin.  The skin just above the umbilicus was nicked with a number #11 blade and was anesthetized using a Ropivacaine mixture of 0.05%  mixed 1:1 with normal saline.  Then using a Veress needle, the abdomen was elevated and the needle was inserted directly.  The peritoneum was felt as a pop when it was passed through.  A syringe with normal saline was attached to the Veress needle.  An aspiration was  performed without difficulty and then saline was injected without difficulty.  A second aspiration was performed.  No blood, fluid, or saline was noted.  Finally, CO2 gas was attached and pneumoperitoneum was achieved without difficulty.  Once 2.5 L of CO2 gas was in the abdomen, a #12 disposable and bladed trochar with port was passed directly into the abdomen.  The laparoscope was used to visualize correct placement.    The abdomen was transilluminated and port site locations were chosen for the #1 and #2 arms.  The skin was anesthetized and then nicked with #11 blades.  8mm skin incisions were made.  Non-disposable and non bladed ports were passed directly into the abdomen.  Finally and assistant's port was placed in the right lower quadrant.  The skin was anesthetized and nicked with #11 blade.  A 5mm disposable and non bladed trochar and port were placed.  Patient was placed in trendelenburg positioning.  The Robot was then docked in the patient's left side in a normal standard fashion.  In the #1 arm was placed endoscopic scissors with monopolar cautery attached.  In the #2 arm was placed a PK [place] with bipolar cautery attached.  60cc of the ropivacaine mixture was placed in the pelvis.  Ureters were noted in the pelvis bilaterally. The uterus is placed on stretch to the left and the right fallopian tube was elevated. Staying along the mesosalpinx the tube is excised off the ovary and taken up to the level of the edge of the uterus. This was done using bipolar cautery. Then the utero-ovarian pedicle was serially clamped cauterized and incised. The right round ligament was serially clamped cauterized and incised. The inferior leaf of the broad ligament was opened and the anterior peritoneum was taken down to level of the internal os of the cervix. The posterior peritoneum was taken down towards the uterosacral ligament on the right side. With attention turned anteriorly the beginning of the bladder flap  was created by elevating the peritoneum dissecting in the avascular plane of tissue. This was carried down below the level of the KOH ring and the uterine artery on the right side was skeletonized. Staying of the KOH ring, the uterine artery was serially cauterized and incised. Excellent hemostasis was noted on the side. In a similar fashion the uterus was placed on stretch to the right and the fallopian tube was excised off the ovary by dissecting the mesosalpinx. Then the utero-ovarian pedicle on the left side was serially clamped cauterized and incised. The round ligament on the left side was clamped cauterized and incised. Bipolar cautery was used for all the cauterization of this point. Again the inferiorly for the probably was opened and the anterior peritoneum was taken across the midline, medium with the dissection on the right side. The remainder of the bladder flap was created and this was well below the level of the KOH ring. The inferior portion of the broad ligament was taken down to the level of the uterosacral ligament. Then the uterine artery left side was skeletonized. Finally staying above the KOH ring left uterine artery was clamped cauterized and incised, the uterus appeared devascularized this point was pale blue. With uterus on stretch  towards patient's head, colpotomy was started in the midline and carried circumferentially around the cervix. This was done a clockwise fashion until the cervix and uterus was freed from the vaginal mucosa. The uterus cervix and bilateral tubes were delivered to the vagina.  Images were changed and in arm #1 was placed needle cut suture driver #2 a Cobra grasper was placed.  A 90 day the lock suture was brought into the midline port. Starting at the right cuff angle, the vagina  was closed using a running stitch. Anterior posterior vaginal mucosa with were incorporated throughout this closure. Once a stitch is taken away across the left side was brought back 2  more times towards the midline and cut flush with the vaginal mucosa. The needle was docked on the right side the pelvis. The pelvis was irrigated no bleeding was noted along any pedicles. In Interceed was placed across vaginal cuff. At this point an amp of indigo carmine was given intravenously to complete the cystoscopy.  The robot was undocked after the instruments were removed.  The ports were removed under direct visualization of the laparoscope except the midline port.  She was taken out of Trendelenberg positioning.  The needle was removed.  The patient was given several deep breaths by the CRNA trying to any gas the abdomen.   then the midline port was removed. The fascia was closed at the midline with a figure-of-eight suture of #0 Vicryl. This incision was closed with subcutaneous stitch of #0 Vicryl. The remaining incisions were closed with Dermabond. Dr. Odis Luster needed the incisions airtight so he could reprep the abdomen again with chlor prep.  The legs are lifted to the high lithotomy position again and using a 70 cystoscope the bladder was visualized. There was no abnormality present to the bladder mucosa. There were no stitches no areas of cauterization are visualized. The ureters were peristalsing with blue dye coming from each ureter without difficulty. This point the cystoscope was removed and the irrigant was completely drained from the bladder. The Foley catheter is replaced the bladder. The patient was then draped at this point. Dr. Odis Luster was in the operating room and the patient was repositioned as needed for his portion of the case. With repositioning the beanbag was deflated and taken out from underneath the patient. Her arms were placed on arm ports outside. The legs were placed in the supine position. Sponge, lap, needle, initially counts were correct x2. Patient tolerated my portion of the procedure very well at this point Dr. Odis Luster was present operating room and ready to prep again  to begin his portion of the procedure.   COUNTS:  YES  PLAN OF CARE: Transfer to PACU

## 2012-05-04 NOTE — Anesthesia Postprocedure Evaluation (Signed)
  Anesthesia Post-op Note  Patient: Marissa Horn  Procedure(s) Performed: Procedure(s) (LRB) with comments: ROBOTIC ASSISTED TOTAL HYSTERECTOMY (N/A) ABDOMINOPLASTY (N/A) BILATERAL SALPINGECTOMY (Bilateral)  Patient Location: [facility]  Anesthesia Type: General  Level of Consciousness: awake, alert  and oriented  Airway and Oxygen Therapy: Patient Spontanous Breathing  Post-op Pain: none  Post-op Assessment: Post-op Vital signs reviewed and Patient's Cardiovascular Status Stable  Post-op Vital Signs: Reviewed and stable  Complications: No apparent anesthesia complications

## 2012-05-04 NOTE — Progress Notes (Addendum)
Subjective: Some pain as expected. Controlled with PCA Dilaudid.  Objective: Vital signs in last 24 hours: Temp:  [97.8 F (36.6 C)-98.8 F (37.1 C)] 97.8 F (36.6 C) (10/01 1630) Pulse Rate:  [78-92] 90  (10/01 1630) Resp:  [15-19] 16  (10/01 1630) BP: (121-135)/(74-83) 121/78 mmHg (10/01 1630) SpO2:  [94 %-100 %] 95 % (10/01 1630) Weight:  [147 lb (66.679 kg)] 147 lb (66.679 kg) (10/01 1630)  Intake/Output from previous day:   Intake/Output this shift: Total I/O In: 2850 [I.V.:2850] Out: 1400 [Urine:1300; Blood:100]  Operative sites:No evidence of bleeding at abdominoplasty site. Abdomen soft. Drains functioning. Drainage thin.  No results found for this basename: WBC:2,HGB:2,HCT:2,PLATELETS:2,NA:2,K:2,CL:2,CO2:2,BUN:2,CREATININE:2,GLU:2 in the last 72 hours  Studies/Results: No results found.  Assessment/Plan: Ambulate tonight.   LOS: 0 days    Shakela Donati M 05/04/2012 5:29 PM   Ancef is prescribed post-op for the abdominoplasty procedure.

## 2012-05-04 NOTE — Addendum Note (Signed)
Addendum  created 05/04/12 1729 by Shanon Payor, CRNA   Modules edited:Notes Section

## 2012-05-04 NOTE — Anesthesia Procedure Notes (Signed)
Procedure Name: Intubation Date/Time: 05/04/2012 8:04 AM Performed by: Aakash Hollomon, Jannet Askew Pre-anesthesia Checklist: Patient identified, Patient being monitored, Emergency Drugs available, Timeout performed and Suction available Patient Re-evaluated:Patient Re-evaluated prior to inductionOxygen Delivery Method: Circle system utilized Preoxygenation: Pre-oxygenation with 100% oxygen Intubation Type: IV induction Ventilation: Mask ventilation without difficulty Laryngoscope Size: Mac and 4 Grade View: Grade I Tube type: Oral Tube size: 7.0 mm Number of attempts: 1 Secured at: 21 cm Dental Injury: Teeth and Oropharynx as per pre-operative assessment

## 2012-05-04 NOTE — Anesthesia Postprocedure Evaluation (Signed)
Anesthesia Post Note  Patient: Marissa Horn  Procedure(s) Performed: Procedure(s) (LRB): ROBOTIC ASSISTED TOTAL HYSTERECTOMY (N/A) ABDOMINOPLASTY (N/A) BILATERAL SALPINGECTOMY (Bilateral)  Anesthesia type: GA  Patient location: PACU  Post pain: Pain level controlled  Post assessment: Post-op Vital signs reviewed  Last Vitals:  Filed Vitals:   05/04/12 1330  BP: 125/78  Pulse: 78  Temp:   Resp: 17    Post vital signs: Reviewed  Level of consciousness: sedated  Complications: No apparent anesthesia complications

## 2012-05-04 NOTE — Anesthesia Preprocedure Evaluation (Signed)
Anesthesia Evaluation  Patient identified by MRN, date of birth, ID band Patient awake    Reviewed: Allergy & Precautions, H&P , NPO status , Patient's Chart, lab work & pertinent test results  Airway Mallampati: I TM Distance: >3 FB Neck ROM: Full    Dental No notable dental hx. (+) Teeth Intact   Pulmonary shortness of breath and with exertion, asthma ,  breath sounds clear to auscultation  Pulmonary exam normal       Cardiovascular negative cardio ROS  Rhythm:Regular Rate:Normal     Neuro/Psych  Headaches, negative psych ROS   GI/Hepatic negative GI ROS, Neg liver ROS,   Endo/Other  negative endocrine ROS  Renal/GU negative Renal ROS  negative genitourinary   Musculoskeletal negative musculoskeletal ROS (+)   Abdominal (+) + obese,   Peds  Hematology negative hematology ROS (+)   Anesthesia Other Findings   Reproductive/Obstetrics negative OB ROS                           Anesthesia Physical Anesthesia Plan  ASA: II  Anesthesia Plan: General   Post-op Pain Management:    Induction: Intravenous  Airway Management Planned: Oral ETT  Additional Equipment:   Intra-op Plan:   Post-operative Plan: Extubation in OR  Informed Consent: I have reviewed the patients History and Physical, chart, labs and discussed the procedure including the risks, benefits and alternatives for the proposed anesthesia with the patient or authorized representative who has indicated his/her understanding and acceptance.   Dental advisory given  Plan Discussed with: Anesthesiologist, Surgeon and CRNA  Anesthesia Plan Comments:         Anesthesia Quick Evaluation

## 2012-05-05 ENCOUNTER — Encounter (HOSPITAL_COMMUNITY): Payer: Self-pay | Admitting: Obstetrics & Gynecology

## 2012-05-05 LAB — CBC
HCT: 30.3 % — ABNORMAL LOW (ref 36.0–46.0)
Hemoglobin: 9.6 g/dL — ABNORMAL LOW (ref 12.0–15.0)
MCHC: 31.7 g/dL (ref 30.0–36.0)
MCV: 84.4 fL (ref 78.0–100.0)
RDW: 15.1 % (ref 11.5–15.5)
WBC: 8.8 10*3/uL (ref 4.0–10.5)

## 2012-05-05 LAB — BASIC METABOLIC PANEL
BUN: 5 mg/dL — ABNORMAL LOW (ref 6–23)
Chloride: 100 mEq/L (ref 96–112)
Creatinine, Ser: 0.67 mg/dL (ref 0.50–1.10)
GFR calc Af Amer: >90 mL/min (ref >90)
Glucose, Bld: 124 mg/dL — ABNORMAL HIGH (ref 70–99)

## 2012-05-05 MED ORDER — ENOXAPARIN SODIUM 40 MG/0.4ML ~~LOC~~ SOLN: 40.0000 mg | SUBCUTANEOUS | Status: DC

## 2012-05-05 MED ORDER — HYDROMORPHONE HCL 2 MG PO TABS
2.0000 mg | ORAL_TABLET | ORAL | Status: DC | PRN
  Administered 2012-05-05: 2 mg via ORAL
  Filled 2012-05-05 (×2): qty 1

## 2012-05-05 MED ORDER — METHOCARBAMOL 500 MG PO TABS: 500.0000 mg | ORAL_TABLET | Freq: Four times a day (QID) | ORAL | Status: DC

## 2012-05-05 MED ORDER — HYDROMORPHONE HCL 2 MG PO TABS
2.0000 mg | ORAL_TABLET | ORAL | Status: DC | PRN
Start: 2012-05-05 — End: 2012-12-14

## 2012-05-05 MED ORDER — HYDROMORPHONE HCL 2 MG PO TABS
2.0000 mg | ORAL_TABLET | ORAL | Status: DC | PRN
  Administered 2012-05-05: 2 mg via ORAL

## 2012-05-05 MED ORDER — PHENOL 1.4 % MT LIQD
1.0000 | OROMUCOSAL | Status: DC | PRN
  Administered 2012-05-05: 1 via OROMUCOSAL
  Filled 2012-05-05: qty 177

## 2012-05-05 NOTE — Discharge Summary (Signed)
Physician Discharge Summary  Patient ID: Marissa Horn MRN: [id number] DOB/AGE: [birth year] 41 y.o.  Admit date: 05/04/2012 Discharge date: 05/05/2012  Admission Diagnoses:menorrhagia, anemia, endometrial polyps, h/o overactive bladder, h/o asthma, desires abdominoplasty  Discharge Diagnoses:  Active Problems:  Anemia  Overactive bladder   Discharged Condition: good  Hospital Course: Patient admitted through same day surgery.  Pre-op Lovenox was given for DVT prophylaxis.  Robotic TLH/bilateral salpingectomy/cystoscopy performed by Marissa Horn.  EBL for this portion of surgery was 50cc.  Dr. Odis Horn was present at the end of my procedure for a planned abdominoplasty.  Patient stable throughout surgery.  After procedures were complete, she was taken to the PACU for an appropriate recovery time.  Then she was transferred to the third floor for the remainder of her hospitalization.  Patient had an indwelling Foley cathter when transferred as well as a JP drain in the subcutaneous space where the abdominoplasty was performed.  She was started on a Dilaudid PCA for pain management.  Patient did require some IV Phenergan for nausea but this was due to limited bowel sounds and her drinking po fluids.  She did not have any episodes of emesis.  Through the night, her vitals were stable and she was afebrile.  UOP was excellent and clear.  She was seen in the PM of POD#1 and her exam was benign.  There was appropriate serosanguinous drainage in the JP drain.  Her bowel sounds were decreased so I encouraged clear liquids only.  She rested well overnight.  In the AM of POD#1, she remained afebrile with stable vital signs.  JP drainage was around 130cc and was much more serous appearing.  Her foley catheter was removed and she was able to void without difficulty.  She was transitioned to a regular diet and was able to ambulate without difficulty.  Her antibiotics and IV were discontinued.  The daily Lovenox was  continued and her husband was taught how to administer the injection.  She will continue its use for 7 full days.  Pain medication was adjusted until she had good pain control with oral Dilaudid.  At this point, she had met all criteria for discharge.  FINDINGS:  Intraoperative findings include normal pelvis, normal upper abdomen, and normal appendix.  Consults: None  Significant Diagnostic Studies: labs: post ob Hb 9.6, electrolytes normal except glucose slightly increased at 124.  Treatments: surgery: Robotic assisted TLH/bilateral salpingectomy/cystoscopy, abdominoplasty with Marissa Horn  Discharge Exam: Blood pressure 123/63, pulse 90, temperature 98 F (36.7 C), temperature source Oral, resp. rate 16, height 5\' 1"  (1.549 m), weight 66.679 kg (147 lb), SpO2 100.00%. General appearance: alert and cooperative Resp: clear to auscultation bilaterally Cardio: regular rate and rhythm, S1, S2 normal, no murmur, click, rub or gallop GI: normal findings: bowel sounds normal and soft but appropriately tender Extremities: extremities normal, atraumatic, no cyanosis or edema Incision/Wound:clean/dry/intact  Disposition: Final discharge disposition not confirmed     Medication List     As of 05/05/2012  2:54 PM    STOP taking these medications         ferrous sulfate 325 (65 FE) MG tablet      TAKE these medications         enoxaparin 40 MG/0.4ML injection   Commonly known as: LOVENOX   Inject 0.4 mLs (40 mg total) into the skin daily.      HYDROmorphone 2 MG tablet   Commonly known as: DILAUDID   Take 1-2 tablets (2-4 mg  total) by mouth every 4 (four) hours as needed.      levalbuterol 45 MCG/ACT inhaler   Commonly known as: XOPENEX HFA   Inhale 1-2 puffs into the lungs every 4 (four) hours as needed for wheezing.      methocarbamol 500 MG tablet   Commonly known as: ROBAXIN   Take 1 tablet (500 mg total) by mouth 4 (four) times daily.      mirabegron ER 50 MG Tb24    Commonly known as: MYRBETRIQ   Take 50 mg by mouth daily.      multivitamin with minerals tablet   Take 1 tablet by mouth daily.           Follow-up Information    Follow up with Marissa Boots, MD. On 06/02/2012. (appt time is 12:30pm)    Contact information:   [address], [address] [place] [address] [phone]       Follow up with Marissa Muskrat, MD. On 05/10/2012. (for recheck of incision and hopeful removal of drain)    Contact information:   [address] [place] [address] [phone]          Signed: Annamaria Horn 05/05/2012, 2:54 PM

## 2012-05-05 NOTE — Progress Notes (Signed)
Subjective: Sore but pain controlled. Tolerating diet.  Objective: Vital signs in last 24 hours: Temp:  [97.8 F (36.6 C)-99 F (37.2 C)] 99 F (37.2 C) (10/02 0541) Pulse Rate:  [78-97] 80  (10/02 0541) Resp:  [15-20] 16  (10/02 0541) BP: (119-135)/(68-83) 135/82 mmHg (10/02 0541) SpO2:  [94 %-100 %] 100 % (10/02 0541) Weight:  [147 lb (66.679 kg)] 147 lb (66.679 kg) (10/01 1630)  Intake/Output from previous day:   Intake/Output this shift:    Operative sites: Abdomen soft. No evidence of vascular compromise. No evidence of bleeding. Drains functioning. Drainage thin.   Basename 05/05/12 0515  WBC 8.8  HGB 9.6*  HCT 30.3*  NA 135  K 3.5  CL 100  CO2 28  BUN 5*  CREATININE 0.67  GLU --    Studies/Results: No results found.  Assessment/Plan: Continue ambulation. Probably discharge later today. PO dilaudid.     Odis Luster, Xavier Munger M 05/05/2012 8:36 AM

## 2012-05-05 NOTE — Op Note (Signed)
[name], HEART NO.:  [id number]  MEDICAL RECORD NO.:  [id number]  LOCATION:  9306                          FACILITY:  [facility]  PHYSICIAN:  Etter Sjogren, M.D.     DATE OF BIRTH:  06-30-1971  DATE OF PROCEDURE:  05/04/2012 DATE OF DISCHARGE:                              OPERATIVE REPORT   PREOPERATIVE DIAGNOSIS:  Excess skin, abdomen.  POSTOPERATIVE DIAGNOSIS:  Excess skin, abdomen.  PROCEDURE PERFORMED:  Abdominoplasty.  SURGEON:  Etter Sjogren, MD  ANESTHESIA:  General.  ESTIMATED BLOOD LOSS:  75 mL.  DRAINS:  One 19-French.  CLINICAL NOTE:  A 41 year old woman who is having the hysterectomy and desired abdominoplasty.  She complained of excess skin of the lower abdomen.  She had a fair amount of adipose in the upper abdomen, but she felt that would respond to weight loss and she clearly understood that the abdominoplasty would not address that area with the area from the umbilicus to the pubis that she wished to address.  The nature of this procedure and the risks of complications were discussed with her in great detail.  The risks included, but not limited to, bleeding, infection, anesthesia-related complications, healing problems, scarring, loss of sensation, fluid accumulations, loss of tissue, loss of skin, loss of the umbilicus, misplacement of the umbilicus off of the midline, asymmetry, contour deformities, pulmonary embolism, damage to deeper structures including the intra-abdominal cavity, disappointment and she understood all of these and wished to proceed.  A sketch was drawn for her and she understood the extent of the incision all away across the lower abdomen as well as an incision around the umbilicus.  DESCRIPTION OF PROCEDURE:  The patient was marked in the holding area prior to the procedure in a full standing position.  She was then taken to the operating room and the GYN portion of the procedure was performed.  Having completed  that, the patient was converted out of the lithotomy position to supine and was then prepped with ChloraPrep and draped with sterile drapes.  The draping was performed after waiting full 3 minutes for drying of the ChloraPrep.  The incision was made around the umbilicus and a generous fatty stalk was left with the umbilicus and then the lower limb of the abdominal incision was then made as marked in the lower abdominal crease and the dissection carried down through the subcutaneous tissue beveling cephalad in order to leave the Scarpa's fascia on the deeper fascia and preserve lymphatics.  The dissection was continued up above the umbilicus with meticulous hemostasis maintained throughout using electrocautery and great care was taken to avoid damage to underlying intra-abdominal cavity.  Around the umbilicus, the dissection was performed in such a way as to leave that generous fatty stalk intact and the umbilicus maintained excellent color and bright red bleeding at its periphery consistent with viability. Having completed this dissection, the midline plication of the rectus muscle edges was performed to correct her small diastasis using 0 Prolene interrupted figure-of-eight sutures.  Great care was taken to avoid damage to underlying abdominal cavity and great care was taken to bury these knots.  The wound was then irrigated thoroughly with  saline. The patient was placed in a semi-Fowler's position, but in some Trendelenburg in order to avoid too much head elevation under anesthesia and again hemostasis with electrocautery.  The careful measurements were then taken for the amount of skin to be removed in this position with a folded towel maintaining underneath to protect the underlying abdominal fascia.  The amputation of the panniculus was performed.  Meticulous hemostasis with electrocautery.  There was bright red bleeding at the skin edges consistent with viability.  A 19-French drain  was positioned, brought out through the separate stab wound inferolaterally on the right- hand side and secured with a 3-0 Prolene suture, and then hemostasis with electrocautery and the closure in layers with 0 PDS interrupted inverted deep sutures, 2-0 Monocryl interrupted inverted deep dermal, 3- 0 Monocryl interrupted inverted deep dermal, and a running 3-0 Monocryl subcuticular suture.  An incision was then made at the midline as had been marked preoperatively and the umbilicus was then brought out through this opening, it was inspected and found to have excellent color and appeared to be viable.  The umbilicus was inset with 3-0 Monocryl interrupted inverted deep dermal sutures.  Dermabond was applied. Antibiotic ointment and dry sterile dressing around the drain.  Some ABDs were placed and abdominal binder and she was transferred to the recovery room in stable having tolerated the procedure well.     Etter Sjogren, M.D.     DB/MEDQ  D:  05/04/2012  T:  05/05/2012  Job:  [id number]

## 2012-05-05 NOTE — Progress Notes (Signed)
1 Day Post-Op Procedure(s) (LRB): ROBOTIC ASSISTED TOTAL HYSTERECTOMY (N/A) ABDOMINOPLASTY (N/A) BILATERAL SALPINGECTOMY (Bilateral)  Subjective: Patient reports incisional pain and tolerating PO.  She is surprised by the amount of pain.  Nausea is gone.  Objective: I have reviewed patient's vital signs, intake and output, medications and labs.  General: alert and cooperative Resp: clear to auscultation bilaterally Cardio: regular rate and rhythm, S1, S2 normal, no murmur, click, rub or gallop GI: soft, non-tender; bowel sounds normal; no masses,  no organomegaly and incision: clean, dry and intact Extremities: extremities normal, atraumatic, no cyanosis or edema Vaginal Bleeding: minimal  Assessment: s/p Procedure(s) (LRB) with comments: ROBOTIC ASSISTED TOTAL HYSTERECTOMY (N/A) ABDOMINOPLASTY (N/A) BILATERAL SALPINGECTOMY (Bilateral): stable and progressing well  Plan: Advance diet Encourage ambulation Discontinue IV fluids Foley out Possible discharge later today  LOS: 1 day    Marissa Horn [name] 05/05/2012, 8:34 AM

## 2012-05-05 NOTE — Progress Notes (Signed)
Patient ID: Merri Brunette, female   DOB: Apr 28, 1971, 40 y.o.   MRN: [id number] Spoke with Selena Batten, RN, for Mrs. Riemenschneider.  Dialudid dose is working well.  Patient resting right now.  Has walked three times and voiding well.  VSS/AF.  Labs this am appropriate.  Feel she is okay for discharge.  Rx for Lovenox 40mg  subcutaneously every am for five more days called to [facility].

## 2012-12-14 ENCOUNTER — Encounter: Payer: Self-pay | Admitting: Medical

## 2012-12-14 ENCOUNTER — Encounter (HOSPITAL_COMMUNITY): Payer: Self-pay | Admitting: Emergency Medicine

## 2012-12-14 ENCOUNTER — Observation Stay (HOSPITAL_COMMUNITY)
Admission: EM | Admit: 2012-12-14 | Discharge: 2012-12-15 | Disposition: A | Discharge status: Home / Self Care | Payer: 59 | Attending: Internal Medicine | Admitting: Internal Medicine

## 2012-12-14 ENCOUNTER — Telehealth: Payer: Self-pay | Admitting: Internal Medicine

## 2012-12-14 ENCOUNTER — Ambulatory Visit (INDEPENDENT_AMBULATORY_CARE_PROVIDER_SITE_OTHER): Payer: 59 | Admitting: Medical

## 2012-12-14 ENCOUNTER — Observation Stay (HOSPITAL_COMMUNITY): Payer: 59

## 2012-12-14 VITALS — BP 130/80 | HR 92 | Temp 98.2°F | Resp 16 | Wt 155.0 lb

## 2012-12-14 DIAGNOSIS — R51 Headache: Secondary | ICD-10-CM

## 2012-12-14 DIAGNOSIS — I059 Rheumatic mitral valve disease, unspecified: Secondary | ICD-10-CM

## 2012-12-14 DIAGNOSIS — R0602 Shortness of breath: Secondary | ICD-10-CM | POA: Insufficient documentation

## 2012-12-14 DIAGNOSIS — N3281 Overactive bladder: Secondary | ICD-10-CM

## 2012-12-14 DIAGNOSIS — D649 Anemia, unspecified: Secondary | ICD-10-CM

## 2012-12-14 DIAGNOSIS — E785 Hyperlipidemia, unspecified: Secondary | ICD-10-CM | POA: Insufficient documentation

## 2012-12-14 DIAGNOSIS — J45909 Unspecified asthma, uncomplicated: Secondary | ICD-10-CM

## 2012-12-14 DIAGNOSIS — J4540 Moderate persistent asthma, uncomplicated: Secondary | ICD-10-CM | POA: Diagnosis present

## 2012-12-14 DIAGNOSIS — R072 Precordial pain: Principal | ICD-10-CM

## 2012-12-14 DIAGNOSIS — I341 Nonrheumatic mitral (valve) prolapse: Secondary | ICD-10-CM | POA: Diagnosis present

## 2012-12-14 DIAGNOSIS — R0789 Other chest pain: Secondary | ICD-10-CM

## 2012-12-14 DIAGNOSIS — I209 Angina pectoris, unspecified: Secondary | ICD-10-CM

## 2012-12-14 HISTORY — DX: Hyperlipidemia, unspecified: E78.5

## 2012-12-14 LAB — CBC WITH DIFFERENTIAL/PLATELET
Eosinophils Relative: 1 % (ref 0–5)
HCT: 38.1 % (ref 36.0–46.0)
Lymphocytes Relative: 31 % (ref 12–46)
Lymphs Abs: 2.3 10*3/uL (ref 0.7–4.0)
MCH: 27.8 pg (ref 26.0–34.0)
MCV: 84.1 fL (ref 78.0–100.0)
Monocytes Absolute: 0.5 10*3/uL (ref 0.1–1.0)
RBC: 4.53 MIL/uL (ref 3.87–5.11)
RDW: 13.7 % (ref 11.5–15.5)
WBC: 7.4 10*3/uL (ref 4.0–10.5)

## 2012-12-14 LAB — COMPREHENSIVE METABOLIC PANEL
BUN: 6 mg/dL (ref 6–23)
CO2: 28 mEq/L (ref 19–32)
Calcium: 9.6 mg/dL (ref 8.4–10.5)
Creatinine, Ser: 0.67 mg/dL (ref 0.50–1.10)
GFR calc Af Amer: >90 mL/min (ref >90)
GFR calc non Af Amer: >90 mL/min (ref >90)
Glucose, Bld: 94 mg/dL (ref 70–99)

## 2012-12-14 LAB — TROPONIN I
Troponin I: <0.3 ng/mL (ref <0.30)
Troponin I: <0.3 ng/mL (ref <0.30)

## 2012-12-14 LAB — CBC
Hemoglobin: 11.8 g/dL — ABNORMAL LOW (ref 12.0–15.0)
MCHC: 33.1 g/dL (ref 30.0–36.0)
Platelets: 335 10*3/uL (ref 150–400)
RDW: 13.8 % (ref 11.5–15.5)

## 2012-12-14 LAB — CK TOTAL AND CKMB (NOT AT ARMC): Total CK: 148 U/L (ref 7–177)

## 2012-12-14 LAB — CREATININE, SERUM
Creatinine, Ser: 0.63 mg/dL (ref 0.50–1.10)
GFR calc non Af Amer: >90 mL/min (ref >90)

## 2012-12-14 LAB — LIPID PANEL: LDL Cholesterol: 147 mg/dL — ABNORMAL HIGH (ref 0–99)

## 2012-12-14 MED ORDER — ACETAMINOPHEN 650 MG RE SUPP: 650.0000 mg | Freq: Four times a day (QID) | RECTAL | Status: DC | PRN

## 2012-12-14 MED ORDER — ADULT MULTIVITAMIN W/MINERALS CH
1.0000 | ORAL_TABLET | Freq: Every day | ORAL | Status: DC
  Filled 2012-12-14 (×2): qty 1

## 2012-12-14 MED ORDER — SODIUM CHLORIDE 0.9 % IJ SOLN
3.0000 mL | Freq: Two times a day (BID) | INTRAMUSCULAR | Status: DC
  Administered 2012-12-14: 3 mL via INTRAVENOUS

## 2012-12-14 MED ORDER — HYDROMORPHONE HCL PF 1 MG/ML IJ SOLN: 1.0000 mg | INTRAMUSCULAR | Status: DC | PRN

## 2012-12-14 MED ORDER — ACETAMINOPHEN 325 MG PO TABS: 650.0000 mg | ORAL_TABLET | Freq: Four times a day (QID) | ORAL | Status: DC | PRN

## 2012-12-14 MED ORDER — HYDROCODONE-ACETAMINOPHEN 5-325 MG PO TABS: 1.0000 | ORAL_TABLET | ORAL | Status: DC | PRN

## 2012-12-14 MED ORDER — MULTI-VITAMIN/MINERALS PO TABS: 1.0000 | ORAL_TABLET | Freq: Every day | ORAL | Status: DC

## 2012-12-14 MED ORDER — NITROGLYCERIN 2 % TD OINT
1.0000 [in_us] | TOPICAL_OINTMENT | Freq: Four times a day (QID) | TRANSDERMAL | Status: DC
  Administered 2012-12-14: 1 [in_us] via TOPICAL
  Filled 2012-12-14: qty 1
  Filled 2012-12-14: qty 30

## 2012-12-14 MED ORDER — ENOXAPARIN SODIUM 40 MG/0.4ML ~~LOC~~ SOLN
40.0000 mg | SUBCUTANEOUS | Status: DC
  Filled 2012-12-14 (×2): qty 0.4

## 2012-12-14 MED ORDER — ONDANSETRON HCL 4 MG/2ML IJ SOLN: 4.0000 mg | Freq: Four times a day (QID) | INTRAMUSCULAR | Status: DC | PRN

## 2012-12-14 MED ORDER — ONDANSETRON HCL 4 MG PO TABS: 4.0000 mg | ORAL_TABLET | Freq: Four times a day (QID) | ORAL | Status: DC | PRN

## 2012-12-14 NOTE — ED Provider Notes (Signed)
PT with extertional chest pressure with SOB, radiating to jaw, left shoulder.  +hx of hyperlipidemia, +FH of early heart dz.  EKG ok, troponin neg, will admit for further evaluation.  Rolan Bucco, MD 12/14/12 [phone]

## 2012-12-14 NOTE — Progress Notes (Signed)
Subjective:  Marissa Horn is a 42 y.o. female who presents as a new patient.  Her husband is a pediatrician and asked that we see her today.   She has a hx/o MVP, last cardiology f/u 6-7 years ago.  She just started exercising a few weeks ago somewhat, but in general hasn't been exercising regularly.  Yesterday she was at her son's camp, was walking and started experiencing chest discomfort, SOB, tightness.  This radiated to her neck and left arm.   She had to slow down and rest.  Rest helped some, but she noted these symptoms persisted all evening, worsened by walking and activity.   The symptoms actually seemed worse this morning.  This morning has continue to feel SOB, chest discomfort, discomfort radiating to neck, even causing a headache.  She reports intermittent chest discomfort now.     She is a nonsmoker, hx/o borderline hyperlipidemia, hx/o asthma, but no hx/o HTN, DVT, PE, or other cardiac disease.  Both parents have a hx/o heart disease.  No other aggravating or relieving factors.    Last cardiology f/u 6-7 years ago, was advised to use beta blocker at that time, but she ultimately did not use this.    She called her prior physician this morning and they suggested she be seen in the ED.  No other c/o.  The following portions of the patient's history were reviewed and updated as appropriate: allergies, current medications, past family history, past medical history, past social history, past surgical history and problem list.  ROS Otherwise as in subjective above  Objective: Physical Exam  Vital signs reviewed  General appearance: alert, no distress, WD/WN, pleasant female Neck: supple, no lymphadenopathy, no thyromegaly, no masses, no JVD Heart: 2/6 brief systolic murmur, heard best left upper and lower sternal borders, otherwise RRR, normal S2 Lungs: CTA bilaterally, no wheezes, rhonchi, or rales Pulses: 2+ radial pulses, 2+ pedal pulses, normal cap refill Ext: no edema   Adult ECG Report  Indication: angina  Rate: 80 bpm  Rhythm: normal sinus rhythm  QRS Axis: 53 degrees  PR Interval:  QRS Duration: 88ms  QTc:  Conduction Disturbances: none  Other Abnormalities: T wave inversion lead 1 only  Patient's cardiac risk factors are: dyslipidemia.  EKG comparison: none  Narrative Interpretation: normal EKG   Assessment: Encounter Diagnoses  Name Primary?  . Angina pectoris Yes  . SOB (shortness of breath)   . Headache   . MVP (mitral valve prolapse)      Plan: Although her vitals, pulse ox and EKG looked ok, advised that given the worsening symptoms (chest pain, SOB, radiating to neck and left arm, DOE), can completley rule out cardiac cause of the pain/angina.  Thus, advised she be seen in the ED.   She declines EMS transport despite the risks.  She will go now to [facility] ED.  I called [facility] triage to advise.

## 2012-12-14 NOTE — Telephone Encounter (Signed)
Patient Information:  Caller Name: [place]  Phone: [phone]  Patient: Marissa Horn, Marissa Horn  Gender: Female  DOB: 1971-05-20  Age: 42 Years  PCP: Eleonore Chiquito [facility])  Pregnant: No  Office Follow Up:  Does the office need to follow up with this patient?: Yes  Instructions For The Office: Referred ER/[facility]  RN Note:  Advised ER/911.  Patient expressed understanding.  Symptoms  Reason For Call & Symptoms: Patient states she is has been having "for a while and getting worse the last few days".  She reports "tightness in her chest , going to bilateral jaws". Intermittent occurrring with rest and exertion. Feels like she cannot get a deep breath. Pressure /Heaviness in middle and left chest . Pain with walking and pressure is worse.   Currently sitting in the car feeling like she has been at the [facility]. Advised ER/911 now  Reviewed Health History In EMR: N/A  Reviewed Medications In EMR: N/A  Reviewed Allergies In EMR: N/A  Reviewed Surgeries / Procedures: N/A  Date of Onset of Symptoms: 12/10/2012 OB / GYN:  LMP: Unknown  Guideline(s) Used:  Chest Pain  Disposition Per Guideline:   Go to ED Now  Reason For Disposition Reached:   Pain also present in shoulder(s) or arm(s) or jaw  Advice Given:  Call Back If:  You become worse.  Patient Will Follow Care Advice:  YES

## 2012-12-14 NOTE — H&P (Signed)
History and Physical       Hospital Admission Note Date: 12/14/2012  Patient name: Marissa Horn Medical record number: [id number] Date of birth: 19-Jun-1971 Age: 42 y.o. Gender: female PCP: Ernst Breach, PA-C    Chief Complaint:  Chest pain or shortness of breath since yesterday  HPI: Patient is a 42 year old female with history of asthma, mitral valve prolapse presented to ED with chest pain with shortness of breath. Patient had last followed up with cardiology 6 years ago in [place], moved to [place] 2 years ago. Patient stated that she hasn't been exercising regularly however yesterday when she was at her son's camp, around 1 PM, she was walking down-hill when she started having chest discomfort, midsternal, shortness of breath with tightness. She had no wheezing or any productive cough. The tightness also radiated to her neck, jaw and to the left arm. The patient slowed down and the chest discomfort resolved however she continued to have the symptoms episodically. The symptoms returned again today when she was dropping her son at the school. Patient called her PCP and was advised to come to the ED. At the time of my examination, patient's chest pain has resolved after starting nitroglycerin paste. She has a history of hyperlipidemia (not on any medications), strong family history of cardiac disease in both parents.   Review of Systems:  Constitutional: Denies fever, chills, diaphoresis, poor appetite and fatigue.  HEENT: Denies photophobia, eye pain, redness, hearing loss, ear pain, congestion, sore throat, rhinorrhea, sneezing, mouth sores, trouble swallowing, neck pain, neck stiffness and tinnitus.   Respiratory: Please see history of present illness  Cardiovascular: Please see history of present illness. Patient denied any orthopnea, PND or any weight gain.  Gastrointestinal: Denies nausea, vomiting, abdominal pain,  diarrhea, constipation, blood in stool and abdominal distention.  Genitourinary: Denies dysuria, urgency, frequency, hematuria, flank pain and difficulty urinating.  Musculoskeletal: Denies myalgias, back pain, joint swelling, arthralgias and gait problem.  Skin: Denies pallor, rash and wound.  Neurological: Denies dizziness, seizures, syncope, weakness, light-headedness, numbness and headaches.  Hematological: Denies adenopathy. Easy bruising, personal or family bleeding history  Psychiatric/Behavioral: Denies suicidal ideation, mood changes, confusion, nervousness, sleep disturbance and agitation  Past Medical History: Past Medical History  Diagnosis Date  . Allergy   . Arthritis   . Hyperlipidemia   . UTI (lower urinary tract infection)   . History of chicken pox   . Migraine   . Asthma     inhaler use as needed   . Shortness of breath     when having asthma attack  . Complication of anesthesia 1990    muscle relaxant caused lung to collapse  . Difficult intubation 1990  . MVP (mitral valve prolapse)    Past Surgical History  Procedure Laterality Date  . Eye surgery    . Anal fissure repair    . Wisdom tooth extraction    . Abdominoplasty  05/04/2012    Procedure: ABDOMINOPLASTY;  Surgeon: Etter Sjogren, MD;  Location: [facility];  Service: Plastics;  Laterality: N/A;    Medications: Prior to Admission medications   Medication Sig Start Date End Date Taking? Authorizing Provider  CycloSPORINE (RESTASIS OP) Apply to eye.    Historical Provider, MD  Multiple Vitamins-Minerals (MULTIVITAMIN WITH MINERALS) tablet Take 1 tablet by mouth daily.    Historical Provider, MD    Allergies:   Allergies  Allergen Reactions  . Scopolamine Anaphylaxis  . Biaxin (Clarithromycin) Nausea And Vomiting    Projectile vomiting  .  Tamiflu (Oseltamivir Phosphate) Nausea And Vomiting    Projectile vomiting  . Asa (Aspirin) Rash    Pt takes ibuprofen without problems    Social History:   reports that she has never smoked. She has never used smokeless tobacco. She reports that she does not drink alcohol or use illicit drugs. she lives at home with her husband and 2 sons  Family History: Family History  Problem Relation Age of Onset  . Arthritis Mother   . Hyperlipidemia Mother   . Arthritis Father   . Hyperlipidemia Father   . Heart disease Father   . Thyroid disease Sister   . Hyperlipidemia Brother     Physical Exam: Blood pressure 136/81, pulse 73, temperature 98.4 F (36.9 C), temperature source Oral, resp. rate 18, last menstrual period 04/14/2012, SpO2 98.00%. General: Alert, awake, oriented x3, in no acute distress. HEENT: normocephalic, atraumatic, anicteric sclera, pink conjunctiva, pupils equal and reactive to light and accomodation, oropharynx clear Neck: supple, no masses or lymphadenopathy, no goiter, no bruits  Heart: Regular rate and rhythm, 2/6 SM. Lungs: Clear to auscultation bilaterally, no wheezing, rales or rhonchi. Abdomen: Soft, nontender, nondistended, positive bowel sounds, no masses. Extremities: No clubbing, cyanosis or edema with positive pedal pulses. Neuro: Grossly intact, no focal neurological deficits, strength 5/5 upper and lower extremities bilaterally Psych: alert and oriented x 3, normal mood and affect Skin: no rashes or lesions, warm and dry   LABS on Admission:  Basic Metabolic Panel:  Recent Labs Lab 12/14/12 1403  NA 137  K 3.5  CL 101  CO2 28  GLUCOSE 94  BUN 6  CREATININE 0.67  CALCIUM 9.6   Liver Function Tests:  Recent Labs Lab 12/14/12 1403  AST 26  ALT 32  ALKPHOS 62  BILITOT 1.1  PROT 7.7  ALBUMIN 3.8   No results found for this basename: LIPASE, AMYLASE,  in the last 168 hours No results found for this basename: AMMONIA,  in the last 168 hours CBC:  Recent Labs Lab 12/14/12 1403  WBC 7.4  NEUTROABS 4.5  HGB 12.6  HCT 38.1  MCV 84.1  PLT 358    No results found for this basename:  GLUCAP,  in the last 168 hours   Radiological Exams on Admission: Dg Chest 2 View  12/14/2012  *RADIOLOGY REPORT*  Clinical Data: Mid chest tightness for 2 days.  History of mitral valve prolapse.  CHEST - 2 VIEW  Comparison: None.  Findings: Normal heart size with clear lung fields.  No bony abnormality.  No effusion or pneumothorax.  IMPRESSION: Negative exam.   Original Report Authenticated By: Davonna Belling, M.D.     Assessment/Plan Principal Problem:   Chest pain, midsternal: Risk factors include strong family history, hyperlipidemia, mitral prolapse. EKG reviewed normal sinus rhythm, 88, no ST-T wave changes suggestive of any ischemia. POC troponin negative. - Admit for observation, serial cardiac enzymes, lipid panel - Patient has allergy to aspirin, continue nitro paste for now - [facility] cardiology consulted n.p.o. after midnight for stress test in a.m. - Will defer to cardiology if echo is needed   Active Problems:   Asthma - Currently stable, no wheezing    Mitral valve prolapse - Cardiology consult   DVT prophylaxis: Lovenox   CODE STATUS: Full code   Further plan will depend as patient's clinical course evolves and further radiologic and laboratory data become available.   Time Spent on Admission: 45 mins   RAI,RIPUDEEP M.D. [facility] 12/14/2012, 3:39  PM Pager: [phone]  If 7PM-7AM, please contact night-coverage [email or web address] Password [email or web address]

## 2012-12-14 NOTE — ED Notes (Signed)
Cp started yesterday rads to neck and shoulders went to dr office and was sent for further

## 2012-12-14 NOTE — ED Provider Notes (Signed)
I saw and evaluated the patient, reviewed the resident's note and I agree with the findings and plan.   Shakeya Kerkman, MD 12/14/12 2021 

## 2012-12-14 NOTE — Progress Notes (Signed)
Utilization review completed.  

## 2012-12-14 NOTE — ED Notes (Signed)
Dr. Belfi at the bedside.  

## 2012-12-14 NOTE — ED Provider Notes (Signed)
History     CSN: [id number] Arrival date & time 12/14/12  1252  First MD Initiated Contact with Patient 12/14/12 1303    Chief Complaint  Patient presents with  . Chest Pain    HPI Comments: 42 yo female with new onset of chest tightness and pressure with exertion yesterday.  She reports that since that time she's had progressive dyspnea with exertion and is now having discomfort after 4 to 6 steps. She's never had anything like this before.  She does have a history of mitral valve prolapse and has been seen by cardiologist 6-7 years ago as had any recurrence of symptoms until now.  This does seem different than her symptoms with MVP.  She reports she had no orthopnea, PND, largely swelling last night and slept soundly.  This morning however her symptoms were worse and began earlier with exertion.  Issues husband is a pediatric infectious disease physician who was in for worked in at her PCPs office this morning.  They have transferred her here for further evaluation.  She has a history of hyperlipidemia and is not on statin and isattempting to control with diet.no history of diabetes, no history of hyperlipidemia, no history of hypothyroidism.  She denies any recent travel, prolonged immobilization, lower extremity swelling, cough or hemoptysis, no hormonal therapy.father does have history of early cardiac disease, no early cardiac death    Patient is a 42 y.o. female presenting with chest pain.  Chest Pain Pain location:  Substernal area Pain quality: pressure, radiating and tightness   Pain radiates to:  L jaw, R jaw, L shoulder and neck Pain radiates to the back: no   Pain severity:  Moderate Onset quality:  Sudden (initially while walking at normal pace, now occuring with minimal activity 4-6 steps) Timing:  Intermittent Progression:  Worsening Chronicity:  New Relieved by:  Nothing Worsened by:  Nothing tried Ineffective treatments:  None tried Associated symptoms: headache  (started after tightening of neck), palpitations (none currently but has been ongoing with chest pressure) and shortness of breath   Associated symptoms: no abdominal pain, no anorexia, no anxiety, no back pain, no claudication, no cough, no diaphoresis, no dizziness, no fever, no lower extremity edema, no nausea, no near-syncope and no numbness     Past Medical History  Diagnosis Date  . Allergy   . Arthritis     hands  . Dyslipidemia   . UTI (lower urinary tract infection)   . History of chicken pox   . Migraine   . Asthma     inhaler use as needed   . Complication of anesthesia 1990    muscle relaxant caused lung to collapse  . Difficult intubation 1990  . MVP (mitral valve prolapse)     Past Surgical History  Procedure Laterality Date  . Eye surgery    . Anal fissure repair    . Wisdom tooth extraction    . Abdominoplasty  05/04/2012    Procedure: ABDOMINOPLASTY;  Surgeon: Etter Sjogren, MD;  Location: [facility];  Service: Plastics;  Laterality: N/A;  . Abdominal hysterectomy      Family History  Problem Relation Age of Onset  . Arthritis Mother   . Hyperlipidemia Mother   . Arthritis Father   . Hyperlipidemia Father   . Heart disease Father 63    CAD  . Thyroid disease Sister   . Hyperlipidemia Brother     History  Substance Use Topics  . Smoking status: Never Smoker   .  Smokeless tobacco: Never Used  . Alcohol Use: No    OB History   Grav Para Term Preterm Abortions TAB SAB Ect Mult Living                  Review of Systems  Constitutional: Negative for fever, chills and diaphoresis.  Respiratory: Positive for chest tightness and shortness of breath. Negative for apnea, cough, choking, wheezing and stridor.   Cardiovascular: Positive for chest pain and palpitations (none currently but has been ongoing with chest pressure). Negative for claudication, leg swelling and near-syncope.  Gastrointestinal: Negative for nausea, abdominal pain and anorexia.   Endocrine: Negative.   Genitourinary: Positive for difficulty urinating (chronic issues, unchanged). Negative for dysuria, frequency and flank pain.  Musculoskeletal: Negative for back pain.  Skin: Negative.   Allergic/Immunologic: Negative.   Neurological: Positive for headaches (started after tightening of neck). Negative for dizziness and numbness.    Allergies  Scopolamine; Biaxin; Tamiflu; and Asa  Home Medications   No current outpatient prescriptions on file.  BP 136/81  Pulse 83  Temp(Src) 98.2 F (36.8 C) (Oral)  Resp 18  Ht 5\' 1"  (1.549 m)  Wt 155 lb (70.308 kg)  BMI 29.3 kg/m2  SpO2 99%  LMP 04/14/2012  Physical Exam  Nursing note and vitals reviewed. Constitutional: She appears well-developed and well-nourished. No distress.  HENT:  Head: Normocephalic and atraumatic.  Eyes: Conjunctivae are normal. Right eye exhibits no discharge. Left eye exhibits no discharge. No scleral icterus.  Neck: No JVD present. No tracheal deviation present.  Cardiovascular: Normal rate, regular rhythm, normal heart sounds and intact distal pulses.  Exam reveals no gallop and no friction rub.   No murmur heard. Click on exam  Pulmonary/Chest: Effort normal and breath sounds normal. No respiratory distress. She has no wheezes. She has no rales.  Abdominal: Soft. She exhibits no distension and no mass. There is no tenderness. There is no rebound and no guarding.  Musculoskeletal: Normal range of motion. She exhibits no edema.  Neurological: She is alert. She exhibits normal muscle tone.  Skin: Skin is warm and dry. No rash noted. She is not diaphoretic. No erythema. No pallor.  Psychiatric: She has a normal mood and affect. Her behavior is normal. Judgment and thought content normal.    ED Course  Procedures (including critical care time)  Labs Reviewed  CBC WITH DIFFERENTIAL  COMPREHENSIVE METABOLIC PANEL  TROPONIN I  CBC  CREATININE, SERUM  LIPID PANEL  TROPONIN I   TROPONIN I  TROPONIN I  CK TOTAL AND CKMB  CK TOTAL AND CKMB  CK TOTAL AND CKMB  BASIC METABOLIC PANEL  CBC   Dg Chest 2 View  12/14/2012  *RADIOLOGY REPORT*  Clinical Data: Mid chest tightness for 2 days.  History of mitral valve prolapse.  CHEST - 2 VIEW  Comparison: None.  Findings: Normal heart size with clear lung fields.  No bony abnormality.  No effusion or pneumothorax.  IMPRESSION: Negative exam.   Original Report Authenticated By: Davonna Belling, M.D.      Results for LATAYNA, RITCHIE (MRN [id number]) as of 12/14/2012 13:43  01/14/2012 09:49  Cholesterol 206 (H)  Triglycerides 256.0 (H)  HDL 41.20  Direct LDL 123.9  VLDL 51.2 (H)  Total CHOL/HDL Ratio 5    Date: 12/14/2012  Rate: 88  Rhythm: normal sinus rhythm  QRS Axis: normal  Intervals: normal  ST/T Wave abnormalities: normal  Conduction Disutrbances: none  Narrative Interpretation: Normal EKG  Old EKG  Reviewed: No significant changes noted   1. Chest pain, midsternal   2. Anemia   3. Asthma   4. Mitral valve prolapse       MDM  1344 - patient with the onset of chest tightness.  Will obtain basic labs including troponin.  Her PERC score is 0; Heart score is 3 due to highly suspicious and 1-2 risk factors.  Will plan for admission for ACS evaluation.  Rash to ASA so will defer anti-platelet therapy at this time.  Nitro paste.        Andrena Mews, DO 12/14/12 [phone]

## 2012-12-14 NOTE — Consult Note (Signed)
CARDIOLOGY CONSULT NOTE  Patient IDLauralyn Horn MRN: [id number] DOB/AGE: 1970-11-16 42 y.o.  Admit date: 12/14/2012 Primary Physician Ernst Breach, PA-C Primary Cardiologist None Chief Complaint  Chest pain  HPI:  The patient presents for evaluation of chest pain.  She has a history of MVP but no recent work up.  She reports that she had chest pain starting yesterday. She was dyspneic with activity such as making a bed which is unusual. She subsequently had some more acute shortness of breath with some associated chest discomfort and tightness in her throat. She ignored this yesterday and was able to sleep with the pain dyspnea dating. He said it was perhaps moderate or mild. However, this morning she felt not herself. Her getting her child ready for school and subsequently walking him in she developed a against shortness of breath the same sensation. She did have some bilateral shoulder discomfort. Saw her primary care who has referred her for further evaluation. We are asked to consult.  Prior to yesterday she feeling well. She's not been having any chest pressure, neck or arm discomfort.  She is otherwise been doing her routine activities of daily living without limitations. She denies any chest pressure or, neck or arm discomfort. She has been shortness of breath, PND or orthopnea. She's had no weight gain he. She has chronic tachycardia palpitations. She reports she did have mitral prolapse some years ago and was suggested that she followup in the years to come with her cardiologist but she doesn't recall any mention of regurgitation.   Past Medical History  Diagnosis Date  . Allergy   . Arthritis     hands  . Dyslipidemia   . UTI (lower urinary tract infection)   . History of chicken pox   . Migraine   . Asthma     inhaler use as needed   . Complication of anesthesia 1990    muscle relaxant caused lung to collapse  . Difficult intubation 1990  . MVP (mitral valve  prolapse)     Past Surgical History  Procedure Laterality Date  . Eye surgery    . Anal fissure repair    . Wisdom tooth extraction    . Abdominoplasty  05/04/2012    Procedure: ABDOMINOPLASTY;  Surgeon: Etter Sjogren, MD;  Location: [facility];  Service: Plastics;  Laterality: N/A;  . Abdominal hysterectomy       Allergies  Allergen Reactions  . Scopolamine Anaphylaxis  . Biaxin (Clarithromycin) Nausea And Vomiting    Projectile vomiting  . Tamiflu (Oseltamivir Phosphate) Nausea And Vomiting    Projectile vomiting  . Asa (Aspirin) Rash    Pt takes ibuprofen without problems   Prescriptions prior to admission  Medication Sig Dispense Refill  . cycloSPORINE (RESTASIS) 0.05 % ophthalmic emulsion Place 1 drop into both eyes daily.      [name] Kitchen ketotifen (ZADITOR) 0.025 % ophthalmic solution Place 1 drop into both eyes 2 (two) times daily.       Family History  Problem Relation Age of Onset  . Arthritis Mother   . Hyperlipidemia Mother   . Arthritis Father   . Hyperlipidemia Father   . Heart disease Father 83    CAD  . Thyroid disease Sister   . Hyperlipidemia Brother     History   Social History  . Marital Status: Married    Spouse Name: N/A    Number of Children: 2  . Years of Education: N/A   Occupational History  .  Not on file.   Social History Main Topics  . Smoking status: Never Smoker   . Smokeless tobacco: Never Used  . Alcohol Use: No  . Drug Use: No  . Sexually Active: Not on file   Other Topics Concern  . Not on file   Social History Narrative  . No narrative on file     ROS:  As stated in the HPI and negative for all other systems.  Physical Exam: Blood pressure 136/81, pulse 83, temperature 98.2 F (36.8 C), temperature source Oral, resp. rate 18, height 5\' 1"  (1.549 m), weight 155 lb (70.308 kg), last menstrual period 04/14/2012, SpO2 99.00%.  GENERAL:  Well appearing HEENT:  Pupils equal round and reactive, fundi not visualized, oral mucosa  unremarkable NECK:  No jugular venous distention, waveform within normal limits, carotid upstroke brisk and symmetric, no bruits, no thyromegaly LYMPHATICS:  No cervical, inguinal adenopathy LUNGS:  Clear to auscultation bilaterally BACK:  No CVA tenderness CHEST:  Unremarkable HEART:  PMI not displaced or sustained,S1 and S2 within normal limits, no S3, no S4, no clicks, no rubs, no murmurs ABD:  Flat, positive bowel sounds normal in frequency in pitch, no bruits, no rebound, no guarding, no midline pulsatile mass, no hepatomegaly, no splenomegaly EXT:  2 plus pulses throughout, no edema, no cyanosis no clubbing SKIN:  No rashes no nodules NEURO:  Cranial nerves II through XII grossly intact, motor grossly intact throughout PSYCH:  Cognitively intact, oriented to person place and time   Labs: Lab Results  Component Value Date   BUN 6 12/14/2012   Lab Results  Component Value Date   CREATININE 0.67 12/14/2012   Lab Results  Component Value Date   NA 137 12/14/2012   K 3.5 12/14/2012   CL 101 12/14/2012   CO2 28 12/14/2012   Lab Results  Component Value Date   TROPONINI <0.30 12/14/2012   Lab Results  Component Value Date   WBC 7.4 12/14/2012   HGB 12.6 12/14/2012   HCT 38.1 12/14/2012   MCV 84.1 12/14/2012   PLT 358 12/14/2012    Lab Results  Component Value Date   ALT 32 12/14/2012   AST 26 12/14/2012   ALKPHOS 62 12/14/2012   BILITOT 1.1 12/14/2012    Radiology:   CXR:  No acute disease.  EKG:NSR, rate 88, axis WNL, no acute ST T wave changes. 12/14/2012  ASSESSMENT AND PLAN:   CHEST PAIN:  Chest pain has some typical and atypical features. She has significant cardiovascular risk factors. Given this ruling out obstructive coronary disease is imperative. Provided the enzymes are negative I think the most prudent procedure would be coronary CT angiography. I have discussed this with the patient in detail.  MVP:  I do not hear a mitral valve click. I don't hear any  regurgitation. I will follow this clinically and consider repeat echo in the future.  I would like to try to obtain her previous echo.  PALPITATIONS:  No change in therapy is indicated.   SignedRollene Rotunda 12/14/2012, 6:19 PM

## 2012-12-15 ENCOUNTER — Observation Stay (HOSPITAL_COMMUNITY): Payer: 59

## 2012-12-15 DIAGNOSIS — R079 Chest pain, unspecified: Secondary | ICD-10-CM

## 2012-12-15 DIAGNOSIS — E785 Hyperlipidemia, unspecified: Secondary | ICD-10-CM | POA: Diagnosis present

## 2012-12-15 LAB — CBC
HCT: 35.5 % — ABNORMAL LOW (ref 36.0–46.0)
Hemoglobin: 11.5 g/dL — ABNORMAL LOW (ref 12.0–15.0)
RDW: 13.6 % (ref 11.5–15.5)
WBC: 8 10*3/uL (ref 4.0–10.5)

## 2012-12-15 LAB — BASIC METABOLIC PANEL
Chloride: 102 mEq/L (ref 96–112)
Creatinine, Ser: 0.7 mg/dL (ref 0.50–1.10)
GFR calc Af Amer: >90 mL/min (ref >90)
GFR calc non Af Amer: >90 mL/min (ref >90)
Potassium: 4.1 mEq/L (ref 3.5–5.1)

## 2012-12-15 LAB — TROPONIN I: Troponin I: <0.3 ng/mL (ref <0.30)

## 2012-12-15 LAB — CK TOTAL AND CKMB (NOT AT ARMC)
CK, MB: 1.3 ng/mL (ref 0.3–4.0)
Relative Index: 1 (ref 0.0–2.5)
Total CK: 125 U/L (ref 7–177)

## 2012-12-15 MED ORDER — IOHEXOL 350 MG/ML SOLN
80.0000 mL | Freq: Once | INTRAVENOUS | Status: AC | PRN
  Administered 2012-12-15: 80 mL via INTRAVENOUS

## 2012-12-15 MED ORDER — SODIUM CHLORIDE 0.9 % IV SOLN
INTRAVENOUS | Status: DC
  Administered 2012-12-15: 08:00:00 via INTRAVENOUS

## 2012-12-15 MED ORDER — METOPROLOL TARTRATE 25 MG PO TABS
25.0000 mg | ORAL_TABLET | ORAL | Status: DC
  Administered 2012-12-15: 25 mg via ORAL
  Filled 2012-12-15 (×3): qty 1

## 2012-12-15 MED ORDER — METOPROLOL TARTRATE 1 MG/ML IV SOLN
5.0000 mg | Freq: Once | INTRAVENOUS | Status: AC
  Administered 2012-12-15: 5 mg via INTRAVENOUS

## 2012-12-15 NOTE — Progress Notes (Signed)
Patient Name: Marissa Horn Date of Encounter: 12/15/2012   Principal Problem:   Chest pain, midsternal Active Problems:   Asthma   Mitral valve prolapse   SUBJECTIVE  No chest pain or sob.  Cardiac CTA negative.  CURRENT MEDS . enoxaparin (LOVENOX) injection  40 mg Subcutaneous Q24H  . metoprolol tartrate  25 mg Oral Q2H  . multivitamin with minerals  1 tablet Oral Daily  . nitroGLYCERIN  1 inch Topical Q6H  . sodium chloride  3 mL Intravenous Q12H    OBJECTIVE  Filed Vitals:   12/15/12 0517 12/15/12 0807 12/15/12 0954 12/15/12 1013  BP: 106/69 110/60  130/70  Pulse: 73 78 58 68  Temp: 98.5 F (36.9 C)     TempSrc: Oral     Resp: 18     Height:      Weight:      SpO2: 98%       Intake/Output Summary (Last 24 hours) at 12/15/12 1209 Last data filed at 12/15/12 0900  Gross per 24 hour  Intake    363 ml  Output      0 ml  Net    363 ml   Filed Weights   12/14/12 1616  Weight: 155 lb (70.308 kg)   PHYSICAL EXAM  General: Pleasant, NAD. Neuro: Alert and oriented X 3. Moves all extremities spontaneously. Psych: Normal affect. HEENT:  Normal  Neck: Supple without bruits or JVD. Lungs:  Resp regular and unlabored, CTA. Heart: RRR no s3, s4, or murmurs. Abdomen: Soft, non-tender, non-distended, BS + x 4.  Extremities: No clubbing, cyanosis or edema. DP/PT/Radials 2+ and equal bilaterally.  Accessory Clinical Findings  CBC  Recent Labs  12/14/12 1403 12/14/12 1849 12/15/12 0452  WBC 7.4 8.3 8.0  NEUTROABS 4.5  --   --   HGB 12.6 11.8* 11.5*  HCT 38.1 35.6* 35.5*  MCV 84.1 84.0 83.7  PLT 358 335 318   Basic Metabolic Panel  Recent Labs  12/14/12 1403 12/14/12 1849 12/15/12 0452  NA 137  --  138  K 3.5  --  4.1  CL 101  --  102  CO2 28  --  26  GLUCOSE 94  --  109*  BUN 6  --  6  CREATININE 0.67 0.63 0.70  CALCIUM 9.6  --  9.1   Liver Function Tests  Recent Labs  12/14/12 1403  AST 26  ALT 32  ALKPHOS 62  BILITOT 1.1  PROT  7.7  ALBUMIN 3.8   Cardiac Enzymes  Recent Labs  12/14/12 1814 12/14/12 2308 12/15/12 0452  CKTOTAL 148 138 125  CKMB 1.5 1.4 1.3  TROPONINI <0.30 <0.30 <0.30   Fasting Lipid Panel  Recent Labs  12/14/12 1850  CHOL 234*  HDL 39*  LDLCALC 147*  TRIG 242*  CHOLHDL 6.0   TELE  rsr  Radiology/Studies  Dg Chest 2 View  12/14/2012   *RADIOLOGY REPORT*  Clinical Data: Mid chest tightness for 2 days.  History of mitral valve prolapse.  CHEST - 2 VIEW  Comparison: None.  Findings: Normal heart size with clear lung fields.  No bony abnormality.  No effusion or pneumothorax.  IMPRESSION: Negative exam.   Original Report Authenticated By: Davonna Belling, M.D.   Ct Coronary Morp W/cta Cor W/score W/ca W/cm &/or Wo/cm  12/15/2012   Cardiac CTA:  Indication: Chest pain  Protocol:  The patient was scanned on a Philips 256 scanner. Gantry rotation speed was 270 msec.  Collimation was .  9mm.  The patient received 5 mg of iv lopressor  and nitro during the scan. Average HR was 65 bpm.  After initial AP and lateral scouts and 3mm axial noncontrast was done through the heart for calcium scoring using the Agatson method.  The patient then received 80cc of contrast with a prospective scan triggered in the descending thoracic aorta at 111 HU's.  Radiation dose was minimized using idose 3 and reducing mA to 300.  A lead shield was used below the waist  Findings:  Calcium Score : 0  LM- normal  LAD- normal        D1:normal  Circumflex- normal and codominant        OM1- normal        OM2- normal        OM3- normal low lying near PDA territory  RCA- normal small vessel  Overall quality of study average with motion artifact and some misregistration  Review of soft tissue and lung windows with no pathology See separate radiology report  Impression:     1)    Normal codiminant coronary arteries        2)    Calcium Score 0  Charlton Haws MD [facility]   Original Report Authenticated By: Charlton Haws, M.D.    ASSESSMENT  AND PLAN  1.  Midsternal chest pain:  No further Ss.  CE negative.  ECG w/o ischemic changes.  Cardiac CTA nl.  No further cardiac w/u indicated.  OK for d/c from cardiac standpoint.  2.  MVP:  outpt f/u arranged (echo: 5/22 2p; f/u Dr. Antoine Poche: 7/7 8:45).  3.  HL:  10 yr risk of 2.5% ->Diet/exercise.  Signed, Nicolasa Ducking NP

## 2012-12-15 NOTE — Progress Notes (Signed)
Pt provided with dc isntructions and education. NO questions at this time. IV removed with tip intact. Heart monitor cleaned and returned to front. Levonne Spiller, RN

## 2012-12-15 NOTE — Progress Notes (Signed)
Followed up with Marissa Horn with [facility] regarding patients coronary CT angiography not being ordered. Was advised to follow up with rounding MD this AM. Will continue to monitor.

## 2012-12-15 NOTE — Discharge Summary (Signed)
Physician Discharge Summary  Patient ID: Marissa Horn MRN: [id number] DOB/AGE: [birth year] 42 y.o.  Admit date: 12/14/2012 Discharge date: 12/15/2012  Primary Care Physician:  Rogelia Boga, MD  Discharge Diagnoses:    . Chest pain, midsternal . Mitral valve prolapse . Asthma .  hyperlipidemia  Consults:  Cardiology   Recommendations for Outpatient Follow-up:  1) patient was advised diet and weight control for hyperlipidemia, she did not want any statins at this time. She is very motivated with lifestyle changes, recommend checking lipids in 3 months. 2) 2-D echo planned on 12/23/2012 at [facility] cardiology office  Allergies:   Allergies  Allergen Reactions  . Scopolamine Anaphylaxis  . Biaxin (Clarithromycin) Nausea And Vomiting    Projectile vomiting  . Tamiflu (Oseltamivir Phosphate) Nausea And Vomiting    Projectile vomiting  . Asa (Aspirin) Rash    Pt takes ibuprofen without problems     Discharge Medications:   Medication List    TAKE these medications       cycloSPORINE 0.05 % ophthalmic emulsion  Commonly known as:  RESTASIS  Place 1 drop into both eyes daily.     ZADITOR 0.025 % ophthalmic solution  Generic drug:  ketotifen  Place 1 drop into both eyes 2 (two) times daily.         Brief H and P: For complete details please refer to admission H and P, but in brief Patient is a 42 year old female with history of asthma, mitral valve prolapse presented to ED with chest pain with shortness of breath. Patient had last followed up with cardiology 6 years ago in [place], moved to [place] 2 years ago. Patient stated that she hasn't been exercising regularly however a day before the admission when she was at her son's camp, around 1 PM, she was walking down-hill when she started having chest discomfort, midsternal, shortness of breath with tightness. She had no wheezing or any productive cough. The tightness also radiated to her neck, jaw and to  the left arm. The patient slowed down and the chest discomfort resolved however she continued to have the symptoms episodically. The symptoms returned again on the day of admission when she was dropping her son at the school. Patient called her PCP and was advised to come to the ED. At the time of my examination in the ED, patient's chest pain has resolved after starting nitroglycerin paste. She has a history of hyperlipidemia (not on any medications), strong family history of cardiac disease in both parents  Hospital Course:     Chest pain, midsternal: With some atypical and typical features. Due to her risk factors of strong family history, hyperlipidemia, mitral valve prolapse, cardiology was consulted. Serial cardiac enzymes were obtained which were negative. Coronary CT angiography was ordered by Dr. Antoine Poche. Patient had normal codominant coronary arteries with calcium score of 0. Patient was cleared for discharge by cardiology service. She will have outpatient echocardiogram on 12/23/12 at [facility] cardiology office and followup by Dr. Antoine Poche.    Asthma: Remained stable    Mitral valve prolapse: Stable, outpatient cardiology followup and 2-D echo as above   hyperlipidemia: LDL 147, cholesterol 234, triglycerides 242. Patient is very motivated for lifestyle changes, exercising, heart healthy diet. She was not started on statins, she will have followup lipid panel by her PCP.   Day of Discharge BP 130/70  Pulse 68  Temp(Src) 98.5 F (36.9 C) (Oral)  Resp 18  Ht 5\' 1"  (1.549 m)  Wt 70.308 kg (155 lb)  BMI 29.3 kg/m2  SpO2 98%  LMP 04/14/2012  Physical Exam: General: Alert and awake oriented x3 not in any acute distress. HEENT: anicteric sclera, pupils reactive to light and accommodation CVS: S1-S2 clear no murmur rubs or gallops Chest: clear to auscultation bilaterally, no wheezing rales or rhonchi Abdomen: soft nontender, nondistended, normal bowel sounds, no  organomegaly Extremities: no cyanosis, clubbing or edema noted bilaterally Neuro: Cranial nerves II-XII intact, no focal neurological deficits   The results of significant diagnostics from this hospitalization (including imaging, microbiology, ancillary and laboratory) are listed below for reference.    LAB RESULTS: Basic Metabolic Panel:  Recent Labs Lab 12/14/12 1403 12/14/12 1849 12/15/12 0452  NA 137  --  138  K 3.5  --  4.1  CL 101  --  102  CO2 28  --  26  GLUCOSE 94  --  109*  BUN 6  --  6  CREATININE 0.67 0.63 0.70  CALCIUM 9.6  --  9.1   Liver Function Tests:  Recent Labs Lab 12/14/12 1403  AST 26  ALT 32  ALKPHOS 62  BILITOT 1.1  PROT 7.7  ALBUMIN 3.8   CBC:  Recent Labs Lab 12/14/12 1403 12/14/12 1849 12/15/12 0452  WBC 7.4 8.3 8.0  NEUTROABS 4.5  --   --   HGB 12.6 11.8* 11.5*  HCT 38.1 35.6* 35.5*  MCV 84.1 84.0 83.7  PLT 358 335 318   Cardiac Enzymes:  Recent Labs Lab 12/14/12 2308 12/15/12 0452  CKTOTAL 138 125  CKMB 1.4 1.3  TROPONINI <0.30 <0.30   BNP: No components found with this basename: POCBNP,  CBG: No results found for this basename: GLUCAP,  in the last 168 hours  Significant Diagnostic Studies:  Dg Chest 2 View  12/14/2012   *RADIOLOGY REPORT*  Clinical Data: Mid chest tightness for 2 days.  History of mitral valve prolapse.  CHEST - 2 VIEW  Comparison: None.  Findings: Normal heart size with clear lung fields.  No bony abnormality.  No effusion or pneumothorax.  IMPRESSION: Negative exam.   Original Report Authenticated By: Davonna Belling, M.D.   Ct Coronary Morp W/cta Cor W/score W/ca W/cm &/or Wo/cm  12/15/2012   Cardiac CTA:  Indication: Chest pain  Protocol:  The patient was scanned on a Philips 256 scanner. Gantry rotation speed was 270 msec.  Collimation was .9mm.  The patient received 5 mg of iv lopressor  and nitro during the scan. Average HR was 65 bpm.  After initial AP and lateral scouts and 3mm axial noncontrast  was done through the heart for calcium scoring using the Agatson method.  The patient then received 80cc of contrast with a prospective scan triggered in the descending thoracic aorta at 111 HU's.  Radiation dose was minimized using idose 3 and reducing mA to 300.  A lead shield was used below the waist  Findings:  Calcium Score : 0  LM- normal  LAD- normal        D1:normal  Circumflex- normal and codominant        OM1- normal        OM2- normal        OM3- normal low lying near PDA territory  RCA- normal small vessel  Overall quality of study average with motion artifact and some misregistration  Review of soft tissue and lung windows with no pathology See separate radiology report  Impression:     1)    Normal codiminant coronary arteries  2)    Calcium Score 0  Charlton Haws MD [facility]   Original Report Authenticated By: Charlton Haws, M.D.     Disposition and Follow-up:     Discharge Orders   Future Appointments Provider Department Dept Phone   12/23/2012 2:00 PM [facility] Echo [facility] SITE 3 ECHO LAB [phone]   02/07/2013 8:45 AM Rollene Rotunda, MD [facility] Main Office [place]) [phone]   09/01/2013 9:30 AM Annamaria Boots, MD [facility] HEALTH CARE [phone]   Future Orders Complete By Expires     Diet - low sodium heart healthy  As directed     Increase activity slowly  As directed         DISPOSITION: Home DIET: Heart healthy diet ACTIVITY: As tolerated  DISCHARGE FOLLOW-UP Follow-up Information   Follow up with Rogelia Boga, MD. Schedule an appointment as soon as possible for a visit in 2 weeks. (for hospital follow-up)    Contact information:   [address] [place] [address] [phone]       Follow up with Rollene Rotunda, MD On 02/07/2013. (8:45 PM)    Contact information:   [address] [place] [address] [phone]       Follow up with [facility] On 12/23/2012.  (2:00 PM - echocardiogram)    Contact information:   [address] [place] [address] [phone]      Time spent on Discharge: 38 mins  Signed:   RAI,RIPUDEEP M.D. [facility] 12/15/2012, 1:12 PM Pager: [phone]

## 2012-12-16 MED FILL — Nitroglycerin SL Tab 0.4 MG: SUBLINGUAL | Qty: 1 | Status: AC

## 2012-12-20 ENCOUNTER — Other Ambulatory Visit: Payer: Self-pay

## 2012-12-20 DIAGNOSIS — Z1231 Encounter for screening mammogram for malignant neoplasm of breast: Secondary | ICD-10-CM

## 2012-12-21 ENCOUNTER — Other Ambulatory Visit (HOSPITAL_COMMUNITY): Payer: Self-pay | Admitting: Cardiology

## 2012-12-21 ENCOUNTER — Ambulatory Visit (HOSPITAL_COMMUNITY): Payer: 59 | Attending: Cardiovascular Disease | Admitting: Radiology

## 2012-12-21 DIAGNOSIS — E785 Hyperlipidemia, unspecified: Secondary | ICD-10-CM | POA: Insufficient documentation

## 2012-12-21 DIAGNOSIS — R072 Precordial pain: Secondary | ICD-10-CM | POA: Insufficient documentation

## 2012-12-21 DIAGNOSIS — R079 Chest pain, unspecified: Secondary | ICD-10-CM

## 2012-12-21 DIAGNOSIS — I059 Rheumatic mitral valve disease, unspecified: Secondary | ICD-10-CM | POA: Insufficient documentation

## 2012-12-21 NOTE — Progress Notes (Signed)
Echocardiogram performed.  

## 2012-12-23 ENCOUNTER — Other Ambulatory Visit (HOSPITAL_COMMUNITY): Payer: 59

## 2012-12-24 ENCOUNTER — Ambulatory Visit (INDEPENDENT_AMBULATORY_CARE_PROVIDER_SITE_OTHER): Payer: 59 | Admitting: Internal Medicine

## 2012-12-24 ENCOUNTER — Encounter: Payer: Self-pay | Admitting: Internal Medicine

## 2012-12-24 VITALS — BP 126/80 | HR 92 | Temp 98.3°F | Resp 20 | Wt 155.0 lb

## 2012-12-24 DIAGNOSIS — D649 Anemia, unspecified: Secondary | ICD-10-CM

## 2012-12-24 DIAGNOSIS — R072 Precordial pain: Secondary | ICD-10-CM

## 2012-12-24 DIAGNOSIS — E785 Hyperlipidemia, unspecified: Secondary | ICD-10-CM

## 2012-12-24 DIAGNOSIS — R0789 Other chest pain: Secondary | ICD-10-CM

## 2012-12-24 NOTE — Progress Notes (Signed)
Subjective:    Patient ID: Marissa Horn, female    DOB: 03/08/1971, 42 y.o.   MRN: [id number]  HPI  82-year-old patient who is seen following a hospital discharge. She was for evaluation of substernal chest pain with radiation to the jaw and shoulders worrisome for unstable angina. Cardiac evaluation was negative. This included a cardiac CTA which revealed normal coronary arteries and a calcium score of 0. She does have some mild nonspecific chest pain. More bothersome are periods of abdominal cramping. She describes occasional constipation alternating with diarrhea. She does have a history of mild dyslipidemia. She has asthma migraines that have been fairly stable  Past Medical History  Diagnosis Date  . Allergy   . Arthritis     hands  . Dyslipidemia   . UTI (lower urinary tract infection)   . History of chicken pox   . Migraine   . Asthma     inhaler use as needed   . Complication of anesthesia 1990    muscle relaxant caused lung to collapse  . Difficult intubation 1990  . MVP (mitral valve prolapse)     History   Social History  . Marital Status: Married    Spouse Name: N/A    Number of Children: 2  . Years of Education: N/A   Occupational History  . Not on file.   Social History Main Topics  . Smoking status: Never Smoker   . Smokeless tobacco: Never Used  . Alcohol Use: No  . Drug Use: No  . Sexually Active: Not on file   Other Topics Concern  . Not on file   Social History Narrative  . No narrative on file    Past Surgical History  Procedure Laterality Date  . Eye surgery    . Anal fissure repair    . Wisdom tooth extraction    . Abdominoplasty  05/04/2012    Procedure: ABDOMINOPLASTY;  Surgeon: Etter Sjogren, MD;  Location: [facility] ORS;  Service: Plastics;  Laterality: N/A;  . Abdominal hysterectomy      Family History  Problem Relation Age of Onset  . Arthritis Mother   . Hyperlipidemia Mother   . Arthritis Father   . Hyperlipidemia Father   . Heart  disease Father 68    CAD  . Thyroid disease Sister   . Hyperlipidemia Brother     Allergies  Allergen Reactions  . Scopolamine Anaphylaxis  . Biaxin (Clarithromycin) Nausea And Vomiting    Projectile vomiting  . Tamiflu (Oseltamivir Phosphate) Nausea And Vomiting    Projectile vomiting  . Asa (Aspirin) Rash    Pt takes ibuprofen without problems    Current Outpatient Prescriptions on File Prior to Visit  Medication Sig Dispense Refill  . cycloSPORINE (RESTASIS) 0.05 % ophthalmic emulsion Place 1 drop into both eyes daily.      [name] Kitchen ketotifen (ZADITOR) 0.025 % ophthalmic solution Place 1 drop into both eyes 2 (two) times daily.       No current facility-administered medications on file prior to visit.    BP 126/80  Pulse 92  Temp(Src) 98.3 F (36.8 C) (Oral)  Resp 20  Wt 155 lb (70.308 kg)  BMI 29.3 kg/m2  SpO2 98%  LMP 04/14/2012       Review of Systems  Constitutional: Negative.   HENT: Negative for hearing loss, congestion, sore throat, rhinorrhea, dental problem, sinus pressure and tinnitus.   Eyes: Negative for pain, discharge and visual disturbance.  Respiratory: Negative for cough and shortness  of breath.   Cardiovascular: Positive for chest pain. Negative for palpitations and leg swelling.  Gastrointestinal: Positive for abdominal pain. Negative for nausea, vomiting, diarrhea, constipation, blood in stool and abdominal distention.  Genitourinary: Negative for dysuria, urgency, frequency, hematuria, flank pain, vaginal bleeding, vaginal discharge, difficulty urinating, vaginal pain and pelvic pain.  Musculoskeletal: Negative for joint swelling, arthralgias and gait problem.  Skin: Negative for rash.  Neurological: Negative for dizziness, syncope, speech difficulty, weakness, numbness and headaches.  Hematological: Negative for adenopathy.  Psychiatric/Behavioral: Negative for behavioral problems, dysphoric mood and agitation. The patient is not nervous/anxious.         Objective:   Physical Exam  Constitutional: She is oriented to person, place, and time. She appears well-developed and well-nourished.  HENT:  Head: Normocephalic.  Right Ear: External ear normal.  Left Ear: External ear normal.  Mouth/Throat: Oropharynx is clear and moist.  Eyes: Conjunctivae and EOM are normal. Pupils are equal, round, and reactive to light.  Neck: Normal range of motion. Neck supple. No thyromegaly present.  Cardiovascular: Normal rate, regular rhythm, normal heart sounds and intact distal pulses.   Pulmonary/Chest: Effort normal and breath sounds normal.  Abdominal: Soft. Bowel sounds are normal. She exhibits no mass. There is no tenderness.  Musculoskeletal: Normal range of motion.  Lymphadenopathy:    She has no cervical adenopathy.  Neurological: She is alert and oriented to person, place, and time.  Skin: Skin is warm and dry. No rash noted.  Psychiatric: She has a normal mood and affect. Her behavior is normal.          Assessment & Plan:   Noncardiac chest pain. Will place on a anti-reflex diet and treat with short-term PPI Intermittent crampy abdominal pain. This associated with alternating diarrhea and constipation. Probable IBS Asthma stable History migraine stable Status post hysterectomy

## 2012-12-24 NOTE — Patient Instructions (Addendum)
Avoids foods high in acid such as tomatoes citrus juices, and spicy foods.  Avoid eating within two hours of lying down or before exercising.  Do not overheat.  Try smaller more frequent meals.  If symptoms persist, elevate the head of her bed 12 inches while sleeping.   Diet and Irritable Bowel Syndrome  No cure has been found for irritable bowel syndrome (IBS). Many options are available to treat the symptoms. Your caregiver will give you the best treatments available for your symptoms. He or she will also encourage you to manage stress and to make changes to your diet. You need to work with your caregiver and Registered Dietician to find the best combination of medicine, diet, counseling, and support to control your symptoms. The following are some diet suggestions. FOODS THAT MAKE IBS WORSE  Fatty foods, such as [place] fries.  Milk products, such as cheese or ice cream.  Chocolate.  Alcohol.  Caffeine (found in coffee and some sodas).  Carbonated drinks, such as soda. If certain foods cause symptoms, you should eat less of them or stop eating them. FOOD JOURNAL   Keep a journal of the foods that seem to cause distress. Write down:  What you are eating during the day and when.  What problems you are having after eating.  When the symptoms occur in relation to your meals.  What foods always make you feel badly.  Take your notes with you to your caregiver to see if you should stop eating certain foods. FOODS THAT MAKE IBS BETTER Fiber reduces IBS symptoms, especially constipation, because it makes stools soft, bulky, and easier to pass. Fiber is found in bran, bread, cereal, beans, fruit, and vegetables. Examples of foods with fiber include:  Apples.  Peaches.  Pears.  Berries.  Figs.  Broccoli, raw.  Cabbage.  Carrots.  Raw peas.  Kidney beans.  Lima beans.  Whole-grain bread.  Whole-grain cereal. Add foods with fiber to your diet a little at a time. This  will let your body get used to them. Too much fiber at once might cause gas and swelling of your abdomen. This can trigger symptoms in a person with IBS. Caregivers usually recommend a diet with enough fiber to produce soft, painless bowel movements. High fiber diets may cause gas and bloating. However, these symptoms often go away within a few weeks, as your body adjusts. In many cases, dietary fiber may lessen IBS symptoms, particularly constipation. However, it may not help pain or diarrhea. High fiber diets keep the colon mildly enlarged (distended) with the added fiber. This may help prevent spasms in the colon. Some forms of fiber also keep water in the stool, thereby preventing hard stools that are difficult to pass.  Besides telling you to eat more foods with fiber, your caregiver may also tell you to get more fiber by taking a fiber pill or drinking water mixed with a special high fiber powder. An example of this is a natural fiber laxative containing psyllium seed.  TIPS  Large meals can cause cramping and diarrhea in people with IBS. If this happens to you, try eating 4 or 5 small meals a day, or try eating less at each of your usual 3 meals. It may also help if your meals are low in fat and high in carbohydrates. Examples of carbohydrates are pasta, rice, whole-grain breads and cereals, fruits, and vegetables.  If dairy products cause your symptoms to flare up, you can try eating less of those  foods. You might be able to handle yogurt better than other dairy products, because it contains bacteria that helps with digestion. Dairy products are an important source of calcium and other nutrients. If you need to avoid dairy products, be sure to talk with a Registered Dietitian about getting these nutrients through other food sources.  Drink enough water and fluids to keep your urine clear or pale yellow. This is important, especially if you have diarrhea. FOR MORE INFORMATION  International  Foundation for Functional Gastrointestinal Disorders: www.iffgd.org  National Digestive Diseases Information Clearinghouse: digestive.StageSync.si Document Released: 10/11/2003 Document Revised: 10/13/2011 Document Reviewed: 06/28/2007 [facility] Patient Information 2014 [place], [place].    It is important that you exercise regularly, at least 20 minutes 3 to 4 times per week.  If you develop chest pain or shortness of breath seek  medical attention.  You need to lose weight.  Consider a lower calorie diet and regular exercise.

## 2013-01-25 ENCOUNTER — Ambulatory Visit
Admission: RE | Admit: 2013-01-25 | Discharge: 2013-01-25 | Disposition: A | Discharge status: Home / Self Care | Payer: 59 | Source: Ambulatory Visit

## 2013-01-25 DIAGNOSIS — Z1231 Encounter for screening mammogram for malignant neoplasm of breast: Secondary | ICD-10-CM

## 2013-02-07 ENCOUNTER — Encounter: Payer: Self-pay | Admitting: Cardiology

## 2013-02-07 ENCOUNTER — Ambulatory Visit (INDEPENDENT_AMBULATORY_CARE_PROVIDER_SITE_OTHER): Payer: 59 | Admitting: Cardiology

## 2013-02-07 VITALS — BP 122/70 | HR 75 | Ht 61.0 in | Wt 154.1 lb

## 2013-02-07 DIAGNOSIS — I059 Rheumatic mitral valve disease, unspecified: Secondary | ICD-10-CM

## 2013-02-07 DIAGNOSIS — R072 Precordial pain: Secondary | ICD-10-CM

## 2013-02-07 DIAGNOSIS — R0789 Other chest pain: Secondary | ICD-10-CM

## 2013-02-07 DIAGNOSIS — I341 Nonrheumatic mitral (valve) prolapse: Secondary | ICD-10-CM

## 2013-02-07 NOTE — Progress Notes (Signed)
   HPI The patient presents for follow up of chest pain.  She was admitted and ruled out for MI. Coronary CT angiography demonstrated no CAD with a calcium score of zero.  Outpatient echo demonstrated no MVP.  Since I last saw her she has done well.  The patient denies any new symptoms such as chest discomfort, neck or arm discomfort. There has been no new shortness of breath, PND or orthopnea. There have been no reported palpitations, presyncope or syncope.    Allergies  Allergen Reactions  . Scopolamine Anaphylaxis  . Biaxin (Clarithromycin) Nausea And Vomiting    Projectile vomiting  . Tamiflu (Oseltamivir Phosphate) Nausea And Vomiting    Projectile vomiting  . Asa (Aspirin) Rash    Pt takes ibuprofen without problems    Current Outpatient Prescriptions  Medication Sig Dispense Refill  . Coenzyme Q10 Liposomal 100 MG/ML LIQD Take 2 mLs by mouth daily as needed.      . cycloSPORINE (RESTASIS) 0.05 % ophthalmic emulsion Place 1 drop into both eyes daily.      [name] Kitchen ketotifen (ZADITOR) 0.025 % ophthalmic solution Place 1 drop into both eyes 2 (two) times daily.      [name] Kitchen levalbuterol (XOPENEX HFA) 45 MCG/ACT inhaler Inhale 1-2 puffs into the lungs every 4 (four) hours as needed for wheezing.       No current facility-administered medications for this visit.    Past Medical History  Diagnosis Date  . Allergy   . Arthritis     hands  . Dyslipidemia   . UTI (lower urinary tract infection)   . History of chicken pox   . Migraine   . Asthma     inhaler use as needed   . Complication of anesthesia 1990    muscle relaxant caused lung to collapse  . Difficult intubation 1990  . MVP (mitral valve prolapse)     Past Surgical History  Procedure Laterality Date  . Eye surgery    . Anal fissure repair    . Wisdom tooth extraction    . Abdominoplasty  05/04/2012    Procedure: ABDOMINOPLASTY;  Surgeon: Etter Sjogren, MD;  Location: [facility];  Service: Plastics;  Laterality: N/A;  . Abdominal  hysterectomy      ROS:  As stated in the HPI and negative for all other systems.  PHYSICAL EXAM BP 122/70  Pulse 75  Ht 5\' 1"  (1.549 m)  Wt 154 lb 1.9 oz (69.908 kg)  BMI 29.14 kg/m2  SpO2 99%  LMP 04/14/2012 GENERAL:  Well appearing NECK:  No jugular venous distention, waveform within normal limits, carotid upstroke brisk and symmetric, no bruits, no thyromegaly LUNGS:  Clear to auscultation bilaterally BACK:  No CVA tenderness HEART:  PMI not displaced or sustained,S1 and S2 within normal limits, no S3, no S4, no clicks, no rubs, no murmurs ABD:  Flat, positive bowel sounds normal in frequency in pitch, no bruits, no rebound, no guarding, no midline pulsatile mass, no hepatomegaly, no splenomegaly EXT:  2 plus pulses throughout, no edema, no cyanosis no clubbing   ASSESSMENT AND PLAN  CHEST PAIN:  Non anginal.  No further work up.  Follow with primary MD.   MVP:  She does not have this on echo.  No further work up is needed.  I have removed this from her problem list.

## 2013-02-07 NOTE — Patient Instructions (Addendum)
The current medical regimen is effective;  continue present plan and medications.  Follow up as needed 

## 2013-04-26 ENCOUNTER — Encounter: Payer: Self-pay | Admitting: Internal Medicine

## 2013-04-26 ENCOUNTER — Ambulatory Visit (INDEPENDENT_AMBULATORY_CARE_PROVIDER_SITE_OTHER): Payer: 59 | Admitting: Internal Medicine

## 2013-04-26 VITALS — BP 120/70 | HR 76 | Temp 98.3°F | Resp 20 | Wt 153.0 lb

## 2013-04-26 DIAGNOSIS — Z23 Encounter for immunization: Secondary | ICD-10-CM

## 2013-04-26 DIAGNOSIS — J45909 Unspecified asthma, uncomplicated: Secondary | ICD-10-CM

## 2013-04-26 DIAGNOSIS — E785 Hyperlipidemia, unspecified: Secondary | ICD-10-CM

## 2013-04-26 LAB — LIPID PANEL
Cholesterol: 235 mg/dL — ABNORMAL HIGH (ref 0–200)
Total CHOL/HDL Ratio: 6
VLDL: 46.2 mg/dL — ABNORMAL HIGH (ref 0.0–40.0)

## 2013-04-26 LAB — LDL CHOLESTEROL, DIRECT: Direct LDL: 150.7 mg/dL

## 2013-04-26 NOTE — Progress Notes (Signed)
  Subjective:    Patient ID: Marissa Horn, female    DOB: 06/12/1971, 42 y.o.   MRN: [id number]  HPI  42 year old patient who is submitted are in the year for noncardiac chest pain. Subsequent to the cardiac CTA was normal.  She has a history mild dyslipidemia and a family history of premature coronary artery disease. She has been adhering to a much better diet.  10 year CVD risk factor  1.9%  Wt Readings from Last 3 Encounters:  04/26/13 153 lb (69.4 kg)  02/07/13 154 lb 1.9 oz (69.908 kg)  12/24/12 155 lb (70.308 kg)    Review of Systems  Constitutional: Negative.   HENT: Negative for hearing loss, congestion, sore throat, rhinorrhea, dental problem, sinus pressure and tinnitus.   Eyes: Negative for pain, discharge and visual disturbance.  Respiratory: Negative for cough and shortness of breath.   Cardiovascular: Negative for chest pain, palpitations and leg swelling.  Gastrointestinal: Positive for abdominal pain and diarrhea. Negative for nausea, vomiting, constipation, blood in stool and abdominal distention.  Genitourinary: Negative for dysuria, urgency, frequency, hematuria, flank pain, vaginal bleeding, vaginal discharge, difficulty urinating, vaginal pain and pelvic pain.  Musculoskeletal: Negative for joint swelling, arthralgias and gait problem.  Skin: Negative for rash.  Neurological: Negative for dizziness, syncope, speech difficulty, weakness, numbness and headaches.  Hematological: Negative for adenopathy.  Psychiatric/Behavioral: Negative for behavioral problems, dysphoric mood and agitation. The patient is not nervous/anxious.        Objective:   Physical Exam  Constitutional: She is oriented to person, place, and time. She appears well-developed and well-nourished.  HENT:  Head: Normocephalic.  Right Ear: External ear normal.  Left Ear: External ear normal.  Mouth/Throat: Oropharynx is clear and moist.  Eyes: Conjunctivae and EOM are normal. Pupils are equal,  round, and reactive to light.  Neck: Normal range of motion. Neck supple. No thyromegaly present.  Cardiovascular: Normal rate, regular rhythm, normal heart sounds and intact distal pulses.   Pulmonary/Chest: Effort normal and breath sounds normal.  Abdominal: Soft. Bowel sounds are normal. She exhibits no mass. There is no tenderness.  Lower transverse abdominal scar. No significant tenderness  Musculoskeletal: Normal range of motion.  Lymphadenopathy:    She has no cervical adenopathy.  Neurological: She is alert and oriented to person, place, and time.  Skin: Skin is warm and dry. No rash noted.  Psychiatric: She has a normal mood and affect. Her behavior is normal.          Assessment & Plan:   Mild dyslipidemia Family history of coronary artery disease Nonspecific abdominal pain  We'll continue high fiber diet exercise program and heart healthy diet. Patient wishes to repeat her lipid profile  Recheck 1 year

## 2013-04-26 NOTE — Patient Instructions (Signed)
Cardiac Diet This diet can help prevent heart disease and stroke. Many factors influence your heart health, including eating and exercise habits. Coronary risk rises a lot with abnormal blood fat (lipid) levels. Cardiac meal planning includes limiting unhealthy fats, increasing healthy fats, and making other small dietary changes. General guidelines are as follows:  Adjust calorie intake to reach and maintain desirable body weight.  Limit total fat intake to less than 30% of total calories. Saturated fat should be less than 7% of calories.  Saturated fats are found in animal products and in some vegetable products. Saturated vegetable fats are found in coconut oil, cocoa butter, palm oil, and palm kernel oil. Read labels carefully to avoid these products as much as possible. Use butter in moderation. Choose tub margarines and oils that have 2 grams of fat or less. Good cooking oils are canola and olive oils.  Practice low-fat cooking techniques. Do not fry food. Instead, broil, bake, boil, steam, grill, roast on a rack, stir-fry, or microwave it. Other fat reducing suggestions include:  Remove the skin from poultry.  Remove all visible fat from meats.  Skim the fat off stews, soups, and gravies before serving them.  Steam vegetables in water or broth instead of sauting them in fat.  Avoid foods with trans fat (or hydrogenated oils), such as commercially fried foods and commercially baked goods. Commercial shortening and deep-frying fats will contain trans fat.  Increase intake of fruits, vegetables, whole grains, and legumes to replace foods high in fat.  Increase consumption of nuts, legumes, and seeds to at least 4 servings weekly. One serving of a legume equals  cup, and 1 serving of nuts or seeds equals  cup.  Choose whole grains more often. Have 3 servings per day (a serving is 1 ounce [oz]).  Eat 4 to 5 servings of vegetables per day. A serving of vegetables is 1 cup of raw leafy  vegetables;  cup of raw or cooked cut-up vegetables;  cup of vegetable juice.  Eat 4 to 5 servings of fruit per day. A serving of fruit is 1 medium whole fruit;  cup of dried fruit;  cup of fresh, frozen, or canned fruit;  cup of 100% fruit juice.  Increase your intake of dietary fiber to 20 to 30 grams per day. Insoluble fiber may help lower your risk of heart disease and may help curb your appetite.  Soluble fiber binds cholesterol to be removed from the blood. Foods high in soluble fiber are dried beans, citrus fruits, oats, apples, bananas, broccoli, Brussels sprouts, and eggplant.  Try to include foods fortified with plant sterols or stanols, such as yogurt, breads, juices, or margarines. Choose several fortified foods to achieve a daily intake of 2 to 3 grams of plant sterols or stanols.  Foods with omega-3 fats can help reduce your risk of heart disease. Aim to have a 3.5 oz portion of fatty fish twice per week, such as salmon, mackerel, albacore tuna, sardines, lake trout, or herring. If you wish to take a fish oil supplement, choose one that contains 1 gram of both DHA and EPA.  Limit processed meats to 2 servings (3 oz portion) weekly.  Limit the sodium in your diet to 1500 milligrams (mg) per day. If you have high blood pressure, talk to a registered dietitian about a DASH (Dietary Approaches to Stop Hypertension) eating plan.  Limit sweets and beverages with added sugar, such as soda, to no more than 5 servings per week. One  serving is:   1 tablespoon sugar.  1 tablespoon jelly or jam.   cup sorbet.  1 cup lemonade.   cup regular soda. CHOOSING FOODS Starches  Allowed: Breads: All kinds (wheat, rye, raisin, white, oatmeal, Italian, French, and English muffin bread). Low-fat rolls: English muffins, frankfurter and hamburger buns, bagels, pita bread, tortillas (not fried). Pancakes, waffles, biscuits, and muffins made with recommended oil.  Avoid: Products made with  saturated or trans fats, oils, or whole milk products. Butter rolls, cheese breads, croissants. Commercial doughnuts, muffins, sweet rolls, biscuits, waffles, pancakes, store-bought mixes. Crackers  Allowed: Low-fat crackers and snacks: Animal, graham, rye, saltine (with recommended oil, no lard), oyster, and matzo crackers. Bread sticks, melba toast, rusks, flatbread, pretzels, and light popcorn.  Avoid: High-fat crackers: cheese crackers, butter crackers, and those made with coconut, palm oil, or trans fat (hydrogenated oils). Buttered popcorn. Cereals  Allowed: Hot or cold whole-grain cereals.  Avoid: Cereals containing coconut, hydrogenated vegetable fat, or animal fat. Potatoes / Pasta / Rice  Allowed: All kinds of potatoes, rice, and pasta (such as macaroni, spaghetti, and noodles).  Avoid: Pasta or rice prepared with cream sauce or high-fat cheese. Chow mein noodles, French fries. Vegetables  Allowed: All vegetables and vegetable juices.  Avoid: Fried vegetables. Vegetables in cream, butter, or high-fat cheese sauces. Limit coconut. Fruit in cream or custard. Protein  Allowed: Limit your intake of meat, seafood, and poultry to no more than 6 oz (cooked weight) per day. All lean, well-trimmed beef, veal, pork, and lamb. All chicken and turkey without skin. All fish and shellfish. Wild game: wild duck, rabbit, pheasant, and venison. Egg whites or low-cholesterol egg substitutes may be used as desired. Meatless dishes: recipes with dried beans, peas, lentils, and tofu (soybean curd). Seeds and nuts: all seeds and most nuts.  Avoid: Prime grade and other heavily marbled and fatty meats, such as short ribs, spare ribs, rib eye roast or steak, frankfurters, sausage, bacon, and high-fat luncheon meats, mutton. Caviar. Commercially fried fish. Domestic duck, goose, venison sausage. Organ meats: liver, gizzard, heart, chitterlings, brains, kidney, sweetbreads. Dairy  Allowed: Low-fat  cheeses: nonfat or low-fat cottage cheese (1% or 2% fat), cheeses made with part skim milk, such as mozzarella, farmers, string, or ricotta. (Cheeses should be labeled no more than 2 to 6 grams fat per oz.). Skim (or 1%) milk: liquid, powdered, or evaporated. Buttermilk made with low-fat milk. Drinks made with skim or low-fat milk or cocoa. Chocolate milk or cocoa made with skim or low-fat (1%) milk. Nonfat or low-fat yogurt.  Avoid: Whole milk cheeses, including colby, cheddar, muenster, Monterey Jack, Havarti, Brie, Camembert, American, Swiss, and blue. Creamed cottage cheese, cream cheese. Whole milk and whole milk products, including buttermilk or yogurt made from whole milk, drinks made from whole milk. Condensed milk, evaporated whole milk, and 2% milk. Soups and Combination Foods  Allowed: Low-fat low-sodium soups: broth, dehydrated soups, homemade broth, soups with the fat removed, homemade cream soups made with skim or low-fat milk. Low-fat spaghetti, lasagna, chili, and Spanish rice if low-fat ingredients and low-fat cooking techniques are used.  Avoid: Cream soups made with whole milk, cream, or high-fat cheese. All other soups. Desserts and Sweets  Allowed: Sherbet, fruit ices, gelatins, meringues, and angel food cake. Homemade desserts with recommended fats, oils, and milk products. Jam, jelly, honey, marmalade, sugars, and syrups. Pure sugar candy, such as gum drops, hard candy, jelly beans, marshmallows, mints, and small amounts of dark chocolate.  Avoid: Commercially prepared   cakes, pies, cookies, frosting, pudding, or mixes for these products. Desserts containing whole milk products, chocolate, coconut, lard, palm oil, or palm kernel oil. Ice cream or ice cream drinks. Candy that contains chocolate, coconut, butter, hydrogenated fat, or unknown ingredients. Buttered syrups. Fats and Oils  Allowed: Vegetable oils: safflower, sunflower, corn, soybean, cottonseed, sesame, canola, olive,  or peanut. Non-hydrogenated margarines. Salad dressing or mayonnaise: homemade or commercial, made with a recommended oil. Low or nonfat salad dressing or mayonnaise.  Limit added fats and oils to 6 to 8 tsp per day (includes fats used in cooking, baking, salads, and spreads on bread). Remember to count the "hidden fats" in foods.  Avoid: Solid fats and shortenings: butter, lard, salt pork, bacon drippings. Gravy containing meat fat, shortening, or suet. Cocoa butter, coconut. Coconut oil, palm oil, palm kernel oil, or hydrogenated oils: these ingredients are often used in bakery products, nondairy creamers, whipped toppings, candy, and commercially fried foods. Read labels carefully. Salad dressings made of unknown oils, sour cream, or cheese, such as blue cheese and Roquefort. Cream, all kinds: half-and-half, light, heavy, or whipping. Sour cream or cream cheese (even if "light" or low-fat). Nondairy cream substitutes: coffee creamers and sour cream substitutes made with palm, palm kernel, hydrogenated oils, or coconut oil. Beverages  Allowed: Coffee (regular or decaffeinated), tea. Diet carbonated beverages, mineral water. Alcohol: Check with your caregiver. Moderation is recommended.  Avoid: Whole milk, regular sodas, and juice drinks with added sugar. Condiments  Allowed: All seasonings and condiments. Cocoa powder. "Cream" sauces made with recommended ingredients.  Avoid: Carob powder made with hydrogenated fats. SAMPLE MENU Breakfast   cup orange juice   cup oatmeal  1 slice toast  1 tsp margarine  1 cup skim milk Lunch  [place] sandwich with 2 oz [place], 2 slices bread  Lettuce and tomato slices  Fresh fruit  Carrot sticks  Coffee or tea Snack  Fresh fruit or low-fat crackers Dinner  3 oz lean ground beef  1 baked potato  1 tsp margarine   cup asparagus  Lettuce salad  1 tbs non-creamy dressing   cup peach slices  1 cup skim milk Document Released:  04/29/2008 Document Revised: 01/20/2012 Document Reviewed: 10/14/2011 [employer] Patient Information 2014 [place], [place]. Exercise to Stay Healthy Exercise helps you become and stay healthy. EXERCISE IDEAS AND TIPS Choose exercises that:  You enjoy.  Fit into your day. You do not need to exercise really hard to be healthy. You can do exercises at a slow or medium level and stay healthy. You can:  Stretch before and after working out.  Try yoga, Pilates, or tai chi.  Lift weights.  Walk fast, swim, jog, run, climb stairs, bicycle, dance, or rollerskate.  Take aerobic classes. Exercises that burn about 150 calories:  Running 1  miles in 15 minutes.  Playing volleyball for 45 to 60 minutes.  Washing and waxing a car for 45 to 60 minutes.  Playing touch football for 45 minutes.  Walking 1  miles in 35 minutes.  Pushing a stroller 1  miles in 30 minutes.  Playing basketball for 30 minutes.  Raking leaves for 30 minutes.  Bicycling 5 miles in 30 minutes.  Walking 2 miles in 30 minutes.  Dancing for 30 minutes.  Shoveling snow for 15 minutes.  Swimming laps for 20 minutes.  Walking up stairs for 15 minutes.  Bicycling 4 miles in 15 minutes.  Gardening for 30 to 45 minutes.  Jumping rope for 15 minutes.  Washing windows or floors for 45 to 60 minutes. Document Released: 08/23/2010 Document Revised: 10/13/2011 Document Reviewed: 08/23/2010 [facility] Patient Information 2014 [place], [place]. Health Maintenance, Females A healthy lifestyle and preventative care can promote health and wellness.  Maintain regular health, dental, and eye exams.  Eat a healthy diet. Foods like vegetables, fruits, whole grains, low-fat dairy products, and lean protein foods contain the nutrients you need without too many calories. Decrease your intake of foods high in solid fats, added sugars, and salt. Get information about a proper diet from your caregiver, if necessary.  Regular  physical exercise is one of the most important things you can do for your health. Most adults should get at least 150 minutes of moderate-intensity exercise (any activity that increases your heart rate and causes you to sweat) each week. In addition, most adults need muscle-strengthening exercises on 2 or more days a week.   Maintain a healthy weight. The body mass index (BMI) is a screening tool to identify possible weight problems. It provides an estimate of body fat based on height and weight. Your caregiver can help determine your BMI, and can help you achieve or maintain a healthy weight. For adults 20 years and older:  A BMI below 18.5 is considered underweight.  A BMI of 18.5 to 24.9 is normal.  A BMI of 25 to 29.9 is considered overweight.  A BMI of 30 and above is considered obese.  Maintain normal blood lipids and cholesterol by exercising and minimizing your intake of saturated fat. Eat a balanced diet with plenty of fruits and vegetables. Blood tests for lipids and cholesterol should begin at age 15 and be repeated every 5 years. If your lipid or cholesterol levels are high, you are over 50, or you are a high risk for heart disease, you may need your cholesterol levels checked more frequently.Ongoing high lipid and cholesterol levels should be treated with medicines if diet and exercise are not effective.  If you smoke, find out from your caregiver how to quit. If you do not use tobacco, do not start.  If you are pregnant, do not drink alcohol. If you are breastfeeding, be very cautious about drinking alcohol. If you are not pregnant and choose to drink alcohol, do not exceed 1 drink per day. One drink is considered to be 12 ounces (355 mL) of beer, 5 ounces (148 mL) of wine, or 1.5 ounces (44 mL) of liquor.  Avoid use of street drugs. Do not share needles with anyone. Ask for help if you need support or instructions about stopping the use of drugs.  High blood pressure causes heart  disease and increases the risk of stroke. Blood pressure should be checked at least every 1 to 2 years. Ongoing high blood pressure should be treated with medicines, if weight loss and exercise are not effective.  If you are 1 to 41 years old, ask your caregiver if you should take aspirin to prevent strokes.  Diabetes screening involves taking a blood sample to check your fasting blood sugar level. This should be done once every 3 years, after age 91, if you are within normal weight and without risk factors for diabetes. Testing should be considered at a younger age or be carried out more frequently if you are overweight and have at least 1 risk factor for diabetes.  Breast cancer screening is essential preventative care for women. You should practice "breast self-awareness." This means understanding the normal appearance and feel of your breasts and  may include breast self-examination. Any changes detected, no matter how small, should be reported to a caregiver. Women in their 60s and 30s should have a clinical breast exam (CBE) by a caregiver as part of a regular health exam every 1 to 3 years. After age 73, women should have a CBE every year. Starting at age 63, women should consider having a mammogram (breast X-ray) every year. Women who have a family history of breast cancer should talk to their caregiver about genetic screening. Women at a high risk of breast cancer should talk to their caregiver about having an MRI and a mammogram every year.  The Pap test is a screening test for cervical cancer. Women should have a Pap test starting at age 7. Between ages 70 and 84, Pap tests should be repeated every 2 years. Beginning at age 22, you should have a Pap test every 3 years as long as the past 3 Pap tests have been normal. If you had a hysterectomy for a problem that was not cancer or a condition that could lead to cancer, then you no longer need Pap tests. If you are between ages 12 and 21, and you  have had normal Pap tests going back 10 years, you no longer need Pap tests. If you have had past treatment for cervical cancer or a condition that could lead to cancer, you need Pap tests and screening for cancer for at least 20 years after your treatment. If Pap tests have been discontinued, risk factors (such as a new sexual partner) need to be reassessed to determine if screening should be resumed. Some women have medical problems that increase the chance of getting cervical cancer. In these cases, your caregiver may recommend more frequent screening and Pap tests.  The human papillomavirus (HPV) test is an additional test that may be used for cervical cancer screening. The HPV test looks for the virus that can cause the cell changes on the cervix. The cells collected during the Pap test can be tested for HPV. The HPV test could be used to screen women aged 67 years and older, and should be used in women of any age who have unclear Pap test results. After the age of 60, women should have HPV testing at the same frequency as a Pap test.  Colorectal cancer can be detected and often prevented. Most routine colorectal cancer screening begins at the age of 16 and continues through age 8. However, your caregiver may recommend screening at an earlier age if you have risk factors for colon cancer. On a yearly basis, your caregiver may provide home test kits to check for hidden blood in the stool. Use of a small camera at the end of a tube, to directly examine the colon (sigmoidoscopy or colonoscopy), can detect the earliest forms of colorectal cancer. Talk to your caregiver about this at age 32, when routine screening begins. Direct examination of the colon should be repeated every 5 to 10 years through age 80, unless early forms of pre-cancerous polyps or small growths are found.  Hepatitis C blood testing is recommended for all people born from [birth year] through 1964 and any individual with known risks for hepatitis  C.  Practice safe sex. Use condoms and avoid high-risk sexual practices to reduce the spread of sexually transmitted infections (STIs). Sexually active women aged 20 and younger should be checked for Chlamydia, which is a common sexually transmitted infection. Older women with new or multiple partners should also be tested  for Chlamydia. Testing for other STIs is recommended if you are sexually active and at increased risk.  Osteoporosis is a disease in which the bones lose minerals and strength with aging. This can result in serious bone fractures. The risk of osteoporosis can be identified using a bone density scan. Women ages 57 and over and women at risk for fractures or osteoporosis should discuss screening with their caregivers. Ask your caregiver whether you should be taking a calcium supplement or vitamin D to reduce the rate of osteoporosis.  Menopause can be associated with physical symptoms and risks. Hormone replacement therapy is available to decrease symptoms and risks. You should talk to your caregiver about whether hormone replacement therapy is right for you.  Use sunscreen with a sun protection factor (SPF) of 30 or greater. Apply sunscreen liberally and repeatedly throughout the day. You should seek shade when your shadow is shorter than you. Protect yourself by wearing long sleeves, pants, a wide-brimmed hat, and sunglasses year round, whenever you are outdoors.  Notify your caregiver of new moles or changes in moles, especially if there is a change in shape or color. Also notify your caregiver if a mole is larger than the size of a pencil eraser.  Stay current with your immunizations. Document Released: 02/03/2011 Document Revised: 10/13/2011 Document Reviewed: 02/03/2011 [facility] Patient Information 2014 [place], [place].

## 2013-06-20 ENCOUNTER — Telehealth: Payer: Self-pay | Admitting: Internal Medicine

## 2013-06-20 NOTE — Telephone Encounter (Signed)
Notify patient of her cholesterol remains at 235. Continue heart healthy diet and regular exercise.

## 2013-06-20 NOTE — Telephone Encounter (Signed)
Spoke to pt told her Cholesterol remains at 235 and told her rest of the lipid panel results. Told pt to continue heart healthy diet and regular exercise. Pt verbalized understanding.

## 2013-06-20 NOTE — Telephone Encounter (Signed)
Pt would like to know the results to her lab work drawn in September.  thanks

## 2013-07-30 ENCOUNTER — Ambulatory Visit (INDEPENDENT_AMBULATORY_CARE_PROVIDER_SITE_OTHER): Payer: 59 | Admitting: Internal Medicine

## 2013-07-30 ENCOUNTER — Encounter: Payer: Self-pay | Admitting: Internal Medicine

## 2013-07-30 VITALS — BP 140/92 | HR 111 | Temp 97.9°F | Ht 61.0 in | Wt 153.4 lb

## 2013-07-30 DIAGNOSIS — J45901 Unspecified asthma with (acute) exacerbation: Secondary | ICD-10-CM | POA: Insufficient documentation

## 2013-07-30 DIAGNOSIS — J209 Acute bronchitis, unspecified: Secondary | ICD-10-CM

## 2013-07-30 MED ORDER — PREDNISONE 10 MG PO TABS: ORAL_TABLET | ORAL | Status: DC

## 2013-07-30 MED ORDER — LEVOFLOXACIN 250 MG PO TABS: 250.0000 mg | ORAL_TABLET | Freq: Every day | ORAL | Status: DC

## 2013-07-30 MED ORDER — LEVALBUTEROL TARTRATE 45 MCG/ACT IN AERO: 1.0000 | INHALATION_SPRAY | Freq: Four times a day (QID) | RESPIRATORY_TRACT | Status: DC | PRN

## 2013-07-30 MED ORDER — METHYLPREDNISOLONE ACETATE 80 MG/ML IJ SUSP
80.0000 mg | Freq: Once | INTRAMUSCULAR | Status: AC
  Administered 2013-07-30: 80 mg via INTRAMUSCULAR

## 2013-07-30 MED ORDER — HYDROCODONE-HOMATROPINE 5-1.5 MG/5ML PO SYRP: 5.0000 mL | ORAL_SOLUTION | Freq: Four times a day (QID) | ORAL | Status: DC | PRN

## 2013-07-30 NOTE — Patient Instructions (Signed)
You had the steroid shot today Please take all new medication as prescribed - the antibiotic, cough medicine, prednisone, and xopenex inhaler Please also use the Dulera 100 sample inhaler at 1 puff twice per day Please continue all other medications as before Please have the pharmacy call with any other refills you may need.  Please remember to sign up for My Chart if you have not done so, as this will be important to you in the future with finding out test results, communicating by private email, and scheduling acute appointments online when needed.

## 2013-07-30 NOTE — Assessment & Plan Note (Signed)
Mild to mod, for antibx course,  to f/u any worsening symptoms or concerns, f/u if worse, consider cxr

## 2013-07-30 NOTE — Progress Notes (Signed)
Subjective:    Patient ID: Marissa Horn, female    DOB: [birth year], 42 y.o.   MRN: [id number]  HPI  Here with acute onset mild to mod 2-3 days ST, HA, general weakness and malaise, with prod cough greenish sputum, but Pt denies chest pain, increased sob or doe, wheezing, orthopnea, PND, increased LE swelling, palpitations, dizziness or syncope, except for onset wheezing,sob since yesterday, some worse this am.  Has had similar in past that required steroid tx.  Albuterol MDI causes jittery and elev HR Past Medical History  Diagnosis Date  . Allergy   . Arthritis     hands  . Dyslipidemia   . UTI (lower urinary tract infection)   . History of chicken pox   . Migraine   . Asthma     inhaler use as needed   . Complication of anesthesia 1990    muscle relaxant caused lung to collapse  . Difficult intubation 1990   Past Surgical History  Procedure Laterality Date  . Eye surgery    . Anal fissure repair    . Wisdom tooth extraction    . Abdominoplasty  05/04/2012    Procedure: ABDOMINOPLASTY;  Surgeon: Etter Sjogren, MD;  Location: [facility];  Service: Plastics;  Laterality: N/A;  . Abdominal hysterectomy      reports that she has never smoked. She has never used smokeless tobacco. She reports that she does not drink alcohol or use illicit drugs. family history includes Arthritis in her father and mother; Heart disease (age of onset: 70) in her father; Hyperlipidemia in her brother, father, and mother; Thyroid disease in her sister. Allergies  Allergen Reactions  . Scopolamine Anaphylaxis  . Biaxin [Clarithromycin] Nausea And Vomiting    Projectile vomiting  . Tamiflu [Oseltamivir Phosphate] Nausea And Vomiting    Projectile vomiting  . Cephalexin Nausea And Vomiting  . Asa [Aspirin] Rash    Pt takes ibuprofen without problems   Current Outpatient Prescriptions on File Prior to Visit  Medication Sig Dispense Refill  . ketotifen (ZADITOR) 0.025 % ophthalmic solution Place 1 drop  into both eyes 2 (two) times daily.      [name] Kitchen levalbuterol (XOPENEX HFA) 45 MCG/ACT inhaler Inhale 1-2 puffs into the lungs every 4 (four) hours as needed for wheezing.       No current facility-administered medications on file prior to visit.   Review of Systems  Constitutional: Negative for unexpected weight change, or unusual diaphoresis  HENT: Negative for tinnitus.   Eyes: Negative for photophobia and visual disturbance.  Respiratory: Negative for choking and stridor.   Gastrointestinal: Negative for vomiting and blood in stool.  Genitourinary: Negative for hematuria and decreased urine volume.  Musculoskeletal: Negative for acute joint swelling Skin: Negative for color change and wound.  Neurological: Negative for tremors and numbness other than noted  Psychiatric/Behavioral: Negative for decreased concentration or  hyperactivity.       Objective:   Physical Exam BP 140/92  Pulse 111  Temp(Src) 97.9 F (36.6 C) (Oral)  Ht 5\' 1"  (1.549 m)  Wt 153 lb 6 oz (69.57 kg)  BMI 28.99 kg/m2  SpO2 96%  LMP 04/14/2012 VS noted, mild ill Constitutional: Pt appears well-developed and well-nourished.  HENT: Head: NCAT.  Right Ear: External ear normal.  Left Ear: External ear normal.  Bilat tm's with mild erythema.  Max sinus areas non tender.  Pharynx with mild erythema, no exudate Eyes: Conjunctivae and EOM are normal. Pupils are equal, round, and reactive  to light.  Neck: Normal range of motion. Neck supple.  Cardiovascular: Normal rate and regular rhythm.   Pulmonary/Chest: Effort normal and breath sounds decreased with few bilat wheezes.  Neurological: Pt is alert. Not confused  Skin: Skin is warm. No erythema.  Psychiatric: Pt behavior is normal. Thought content normal.      Assessment & Plan:

## 2013-07-30 NOTE — Progress Notes (Signed)
Pre-visit discussion using our clinic review tool. No additional management support is needed unless otherwise documented below in the visit note.  

## 2013-07-30 NOTE — Assessment & Plan Note (Signed)
Mild to mod, for depomedrol IM, predpack asd,  to f/u any worsening symptoms or concerns, cough med prn

## 2013-08-30 ENCOUNTER — Encounter: Payer: Self-pay | Admitting: Obstetrics & Gynecology

## 2013-09-01 ENCOUNTER — Ambulatory Visit (INDEPENDENT_AMBULATORY_CARE_PROVIDER_SITE_OTHER): Payer: 59 | Admitting: Obstetrics & Gynecology

## 2013-09-01 ENCOUNTER — Encounter: Payer: Self-pay | Admitting: Obstetrics & Gynecology

## 2013-09-01 VITALS — BP 120/84 | HR 80 | Resp 16 | Ht 60.25 in | Wt 150.0 lb

## 2013-09-01 DIAGNOSIS — Z01419 Encounter for gynecological examination (general) (routine) without abnormal findings: Secondary | ICD-10-CM

## 2013-09-01 DIAGNOSIS — Z0000 Encounter for general adult medical examination without abnormal findings: Secondary | ICD-10-CM

## 2013-09-01 LAB — POCT URINALYSIS DIPSTICK
Bilirubin, UA: NEGATIVE
Blood, UA: NEGATIVE
GLUCOSE UA: NEGATIVE
Ketones, UA: NEGATIVE
Leukocytes, UA: NEGATIVE
Nitrite, UA: NEGATIVE
Protein, UA: NEGATIVE
UROBILINOGEN UA: NEGATIVE
pH, UA: 5

## 2013-09-01 LAB — THYROID PANEL WITH TSH
FREE THYROXINE INDEX: 2.8 (ref 1.0–3.9)
T3 Uptake: 27.8 % (ref 22.5–37.0)
T4 TOTAL: 10 ug/dL (ref 5.0–12.5)
TSH: 3.336 u[IU]/mL (ref 0.350–4.500)

## 2013-09-01 LAB — HEMOGLOBIN, FINGERSTICK: HEMOGLOBIN, FINGERSTICK: 12.7 g/dL (ref 12.0–16.0)

## 2013-09-01 NOTE — Progress Notes (Signed)
43 y.o. G3P2 Married Indian/Asian F here for annual exam.  Doing really well.  Feeling great.    Patient's last menstrual period was 04/04/2012.          Sexually active: yes  The current method of family planning is status post hysterectomy.    Exercising: no  not regularly Smoker:  no  Health Maintenance: Pap:  01/08/12 WNL History of abnormal Pap:  yes MMG:  01/25/13 normal Colonoscopy:  none BMD:   none TDaP:  9/14 Screening Labs: Dr. Raliegh Ip, Hb today: 12.7, Urine today: negative   reports that she has never smoked. She has never used smokeless tobacco. She reports that she does not drink alcohol or use illicit drugs.  Past Medical History  Diagnosis Date  . Allergy   . Arthritis     hands  . Dyslipidemia   . UTI (lower urinary tract infection)   . History of chicken pox   . Migraine   . Asthma     inhaler use as needed   . Complication of anesthesia 1990    muscle relaxant caused lung to collapse  . Difficult intubation 1990    Past Surgical History  Procedure Laterality Date  . Eye surgery    . Anal fissure repair    . Wisdom tooth extraction    . Abdominoplasty  05/04/2012    Procedure: ABDOMINOPLASTY;  Surgeon: Crissie Reese, MD;  Location: [facility];  Service: Plastics;  Laterality: N/A;  . Abdominal hysterectomy    . Gynecologic cryosurgery      Current Outpatient Prescriptions  Medication Sig Dispense Refill  . ketotifen (ZADITOR) 0.025 % ophthalmic solution Place 1 drop into both eyes 2 (two) times daily.      [name] Kitchen levalbuterol (XOPENEX HFA) 45 MCG/ACT inhaler Inhale 1-2 puffs into the lungs every 6 (six) hours as needed for wheezing.  1 Inhaler  5   No current facility-administered medications for this visit.    Family History  Problem Relation Age of Onset  . Arthritis Mother   . Hyperlipidemia Mother   . Arthritis Father   . Hyperlipidemia Father   . Heart disease Father 8    CAD  . Thyroid disease Sister   . Hyperlipidemia Brother   . Glaucoma Mother    . Glaucoma Father     ROS:  Pertinent items are noted in HPI.  Otherwise, a comprehensive ROS was negative.  Exam:   BP 120/84  Pulse 80  Resp 16  Ht 5' 0.25" (1.53 m)  Wt 150 lb (68.04 kg)  BMI 29.07 kg/m2  LMP 04/04/2012  Weight change: -2lbs  Height: 5' 0.25" (153 cm)  Ht Readings from Last 3 Encounters:  09/01/13 5' 0.25" (1.53 m)  07/30/13 5' 1"  (1.549 m)  02/07/13 5' 1"  (1.549 m)    General appearance: alert, cooperative and appears stated age Head: Normocephalic, without obvious abnormality, atraumatic Breasts: normal appearance, no masses or tenderness Abdomen: soft, non-tender; bowel sounds normal; no masses,  no organomegaly Extremities: extremities normal, atraumatic, no cyanosis or edema Skin: Skin color, texture, turgor normal. No rashes or lesions Lymph nodes: Cervical, supraclavicular, and axillary nodes normal. No abnormal inguinal nodes palpated Neurologic: Grossly normal   Pelvic: External genitalia:  no lesions              Urethra:  normal appearing urethra with no masses, tenderness or lesions              Bartholins and Skenes: normal  Vagina: normal appearing vagina with normal color and discharge, no lesions              Cervix: absent              Pap taken: no Bimanual Exam:  Uterus:  uterus absent              Adnexa: normal adnexa and no mass, fullness, tenderness               Rectovaginal: Confirms               Anus:  normal sphincter tone, no lesions  A:  Well Woman with normal exam S/p robotic assisted TLH/bilateral salpingectomy H/O elevated lipids Long hx of anemia. Normal hb today.  P:   Mammogram yearly. pap smear not indicated. Labs with Dr. Raliegh Ip.  Will do thyroid panel today. return annually or prn  An After Visit Summary was printed and given to the patient.

## 2013-09-01 NOTE — Patient Instructions (Signed)

## 2013-10-05 ENCOUNTER — Telehealth: Payer: Self-pay | Admitting: Internal Medicine

## 2013-10-05 ENCOUNTER — Ambulatory Visit: Payer: Self-pay | Admitting: Internal Medicine

## 2013-10-05 ENCOUNTER — Ambulatory Visit: Payer: 59 | Admitting: Family Medicine

## 2013-10-05 NOTE — Telephone Encounter (Signed)
[place] with me if ok with Dr Raliegh Ip

## 2013-10-05 NOTE — Telephone Encounter (Signed)
Pt would like to switch providers from Dr. Burnice Logan to Dr. Jenny Reichmann.  Will this be ok?

## 2013-10-05 NOTE — Telephone Encounter (Signed)
Patient Information:  Caller Name: [place]inia  Phone: [phone]  Patient: Marissa Horn, Marissa Horn  Gender: Female  DOB: Sep 21, 1970  Age: 43 Years  PCP: Bluford Kaufmann (Family Practice > 51yr old)  Pregnant: No  Office Follow Up:  Does the office need to follow up with this patient?: Yes  Instructions For The Office: Please contact patient.  She cancelled 4:00 pm appt due to schedule.  [facility]  PLEASE CONTACT PATIENT FOR ASSISTANCE.  RN Note:  Please contact patient.  She cancelled 4:00 pm appt due to schedule.  [facility]  PLEASE CONTACT PATIENT FOR ASSISTANCE.  Symptoms  Reason For Call & Symptoms: Patient states she has appt today 10/05/13 at 16:00 to see her physician for Asthma. She wants to cancel this appt and take another time.  She wants to come to the office now for evaluation. She reports onset of symptoms yesterday 10/04/13. Using home breathing treatments (nebulizer and pump)  and they are now helping.. She feels her lungs are sore. Denies chest pain. She states she needs to be evaluated but having to use her inhalers more. She wants a "steroid shot".  Reviewed Health History In EMR: Yes  Reviewed Medications In EMR: Yes  Reviewed Allergies In EMR: Yes  Reviewed Surgeries / Procedures: Yes  Date of Onset of Symptoms: 10/05/2013  Treatments Tried: Abuterol and pulmicort  Treatments Tried Worked: Yes OB / GYN:  LMP: Unknown  Guideline(s) Used:  Asthma Attack  Disposition Per Guideline:   See Today in Office  Reason For Disposition Reached:   Asthma medicine (nebulizer or inhaler) is needed more frequently than every 4 hours to keep you comfortable  Advice Given:  Quick-Relief Asthma Medicine:   Start your quick-relief medicine (e.g., albuterol, salbutamol) at the first sign of any coughing or shortness of breath (don't wait for wheezing). Use your inhaler (2 puffs each time) or nebulizer every 4 hours. Continue the quick-relief medicine until you have  not wheezed or coughed for 48 hours.  The best "cough medicine" for an adult with asthma is always the asthma medicine (Note: Don't use cough suppressants, but cough drops may help a tickly cough).  Drinking Liquids:  Try to drink normal amount of liquids (e.g., water). Being adequately hydrated makes it easier to cough up the sticky lung mucus.  Humidifier:   If the air is dry, use a cool mist humidifier to prevent drying of the upper airway.  Call Back If:  You become worse.  RN Overrode Recommendation:  Make Appointment  Please contact patient.  She cancelled 4:00 pm appt due to schedule.  [facility]  PLEASE CONTACT PATIENT FOR ASSISTANCE.

## 2013-10-05 NOTE — Telephone Encounter (Signed)
Spoke to pt, c/o asthma flare and needs steriod shot. Told her needs to keep appointment I can not do anything for you without being seen. Pt verbalized understanding and stated  can not come in today due to no childcare help and one child sick. Pt said can come tomorrow morning. Told pt okay, checked the schedule and scheduled an appointment for 9:45 am tomorrow March 5 th  with Dr. Elease Hashimoto. Pt verbalized understanding.

## 2013-10-06 ENCOUNTER — Ambulatory Visit (INDEPENDENT_AMBULATORY_CARE_PROVIDER_SITE_OTHER): Payer: 59 | Admitting: Family Medicine

## 2013-10-06 ENCOUNTER — Encounter: Payer: Self-pay | Admitting: Family Medicine

## 2013-10-06 VITALS — BP 128/78 | HR 113 | Temp 98.3°F | Wt 156.0 lb

## 2013-10-06 DIAGNOSIS — J45909 Unspecified asthma, uncomplicated: Secondary | ICD-10-CM

## 2013-10-06 DIAGNOSIS — J45901 Unspecified asthma with (acute) exacerbation: Secondary | ICD-10-CM

## 2013-10-06 MED ORDER — LEVALBUTEROL TARTRATE 45 MCG/ACT IN AERO: 1.0000 | INHALATION_SPRAY | Freq: Four times a day (QID) | RESPIRATORY_TRACT | Status: DC | PRN

## 2013-10-06 MED ORDER — METHYLPREDNISOLONE ACETATE 80 MG/ML IJ SUSP
80.0000 mg | Freq: Once | INTRAMUSCULAR | Status: AC
Start: 2013-10-06 — End: 2013-10-06
  Administered 2013-10-06: 80 mg via INTRAMUSCULAR

## 2013-10-06 MED ORDER — BUDESONIDE 180 MCG/ACT IN AEPB
1.0000 | INHALATION_SPRAY | Freq: Two times a day (BID) | RESPIRATORY_TRACT | Status: DC
Start: 2013-10-06 — End: 2013-12-27

## 2013-10-06 NOTE — Progress Notes (Signed)
Pre visit review using our clinic review tool, if applicable. No additional management support is needed unless otherwise documented below in the visit note. 

## 2013-10-06 NOTE — Patient Instructions (Signed)
Asthma, Adult Asthma is a recurring condition in which the airways tighten and narrow. Asthma can make it difficult to breathe. It can cause coughing, wheezing, and shortness of breath. Asthma episodes (also called asthma attacks) range from minor to life-threatening. Asthma cannot be cured, but medicines and lifestyle changes can help control it. CAUSES Asthma is believed to be caused by inherited (genetic) and environmental factors, but its exact cause is unknown. Asthma may be triggered by allergens, lung infections, or irritants in the air. Asthma triggers are different for each person. Common triggers include:   Animal dander.  Dust mites.  Cockroaches.  Pollen from trees or grass.  Mold.  Smoke.  Air pollutants such as dust, household cleaners, hair sprays, aerosol sprays, paint fumes, strong chemicals, or strong odors.  Cold air, weather changes, and winds (which increase molds and pollens in the air).  Strong emotional expressions such as crying or laughing hard.  Stress.  Certain medicines (such as aspirin) or types of drugs (such as beta-blockers).  Sulfites in foods and drinks. Foods and drinks that may contain sulfites include dried fruit, potato chips, and sparkling grape juice.  Infections or inflammatory conditions such as the flu, a cold, or an inflammation of the nasal membranes (rhinitis).  Gastroesophageal reflux disease (GERD).  Exercise or strenuous activity. SYMPTOMS Symptoms may occur immediately after asthma is triggered or many hours later. Symptoms include:  Wheezing.  Excessive nighttime or early morning coughing.  Frequent or severe coughing with a common cold.  Chest tightness.  Shortness of breath. DIAGNOSIS  The diagnosis of asthma is made by a review of your medical history and a physical exam. Tests may also be performed. These may include:  Lung function studies. These tests show how much air you breath in and out.  Allergy  tests.  Imaging tests such as X-rays. TREATMENT  Asthma cannot be cured, but it can usually be controlled. Treatment involves identifying and avoiding your asthma triggers. It also involves medicines. There are 2 classes of medicine used for asthma treatment:   Controller medicines. These prevent asthma symptoms from occurring. They are usually taken every day.  Reliever or rescue medicines. These quickly relieve asthma symptoms. They are used as needed and provide short-term relief. Your health care provider will help you create an asthma action plan. An asthma action plan is a written plan for managing and treating your asthma attacks. It includes a list of your asthma triggers and how they may be avoided. It also includes information on when medicines should be taken and when their dosage should be changed. An action plan may also involve the use of a device called a peak flow meter. A peak flow meter measures how well the lungs are working. It helps you monitor your condition. HOME CARE INSTRUCTIONS   Take medicine as directed by your health care provider. Speak with your health care provider if you have questions about how or when to take the medicines.  Use a peak flow meter as directed by your health care provider. Record and keep track of readings.  Understand and use the action plan to help minimize or stop an asthma attack without needing to seek medical care.  Control your home environment in the following ways to help prevent asthma attacks:  Do not smoke. Avoid being exposed to secondhand smoke.  Change your heating and air conditioning filter regularly.  Limit your use of fireplaces and wood stoves.  Get rid of pests (such as roaches and  mice) and their droppings.  Throw away plants if you see mold on them.  Clean your floors and dust regularly. Use unscented cleaning products.  Try to have someone else vacuum for you regularly. Stay out of rooms while they are being  vacuumed and for a short while afterward. If you vacuum, use a dust mask from a hardware store, a double-layered or microfilter vacuum cleaner bag, or a vacuum cleaner with a HEPA filter.  Replace carpet with wood, tile, or vinyl flooring. Carpet can trap dander and dust.  Use allergy-proof pillows, mattress covers, and box spring covers.  Wash bed sheets and blankets every week in hot water and dry them in a dryer.  Use blankets that are made of polyester or cotton.  Clean bathrooms and kitchens with bleach. If possible, have someone repaint the walls in these rooms with mold-resistant paint. Keep out of the rooms that are being cleaned and painted.  Wash hands frequently. SEEK MEDICAL CARE IF:   You have wheezing, shortness of breath, or a cough even if taking medicine to prevent attacks.  The colored mucus you cough up (sputum) is thicker than usual.  Your sputum changes from clear or white to yellow, green, gray, or bloody.  You have any problems that may be related to the medicines you are taking (such as a rash, itching, swelling, or trouble breathing).  You are using a reliever medicine more than 2 3 times per week.  Your peak flow is still at 50 79% of you personal best after following your action plan for 1 hour. SEEK IMMEDIATE MEDICAL CARE IF:   You seem to be getting worse and are unresponsive to treatment during an asthma attack.  You are short of breath even at rest.  You get short of breath when doing very little physical activity.  You have difficulty eating, drinking, or talking due to asthma symptoms.  You develop chest pain.  You develop a fast heartbeat.  You have a bluish color to your lips or fingernails.  You are lightheaded, dizzy, or faint.  Your peak flow is less than 50% of your personal best.  You have a fever or persistent symptoms for more than 2 3 days.  You have a fever and symptoms suddenly get worse. MAKE SURE YOU:   Understand these  instructions.  Will watch your condition.  Will get help right away if you are not doing well or get worse. Document Released: 07/21/2005 Document Revised: 03/23/2013 Document Reviewed: 02/17/2013 [facility] Patient Information 2014 [place], [place].

## 2013-10-06 NOTE — Progress Notes (Signed)
Subjective:    Patient ID: Marissa Horn, female    DOB: [birth year], 43 y.o.   MRN: [id number]  Asthma She complains of cough and wheezing. Pertinent negatives include no chest pain, fever or sore throat. Her past medical history is significant for asthma.   Acute visit Patient is nonsmoker with history of reported asthma who is seen with onset of asthma exacerbation this past Tuesday. She developed some nonproductive cough and wheezing. She has similar exacerbation several months ago and received IM steroids and oral steroids symptoms improved. She has had what sounds like mild intermittent asthma and on further questioning she relates almost nightly cough. She does have Pulmicort by nebulizer but only takes infrequently. She has used Xopenex as a rescue inhaler but is currently out. No recent fever. She is not aware of specific triggers but wonders of some of this may be related to changes in weather. She thinks she may have more frequent exacerbations in the wintertime.  Past Medical History  Diagnosis Date  . Allergy   . Arthritis     hands  . Dyslipidemia   . UTI (lower urinary tract infection)   . History of chicken pox   . Migraine   . Asthma     inhaler use as needed   . Complication of anesthesia 1990    muscle relaxant caused lung to collapse  . Difficult intubation 1990   Past Surgical History  Procedure Laterality Date  . Eye surgery    . Anal fissure repair    . Wisdom tooth extraction    . Abdominoplasty  05/04/2012    Procedure: ABDOMINOPLASTY;  Surgeon: Crissie Reese, MD;  Location: [facility];  Service: Plastics;  Laterality: N/A;  . Abdominal hysterectomy    . Gynecologic cryosurgery      reports that she has never smoked. She has never used smokeless tobacco. She reports that she does not drink alcohol or use illicit drugs. family history includes Arthritis in her father and mother; Glaucoma in her father and mother; Heart disease (age of onset: 14) in her father;  Hyperlipidemia in her brother, father, and mother; Thyroid disease in her sister. Allergies  Allergen Reactions  . Scopolamine Anaphylaxis  . Biaxin [Clarithromycin] Nausea And Vomiting    Projectile vomiting  . Tamiflu [Oseltamivir Phosphate] Nausea And Vomiting    Projectile vomiting  . Cephalexin Nausea And Vomiting  . Asa [Aspirin] Rash    Pt takes ibuprofen without problems      Review of Systems  Constitutional: Negative for fever and chills.  HENT: Negative for congestion and sore throat.   Respiratory: Positive for cough and wheezing.   Cardiovascular: Negative for chest pain.  Neurological: Negative for dizziness and syncope.       Objective:   Physical Exam  Vitals reviewed. Constitutional: She appears well-developed and well-nourished.  HENT:  Right Ear: External ear normal.  Left Ear: External ear normal.  Mouth/Throat: Oropharynx is clear and moist.  Neck: Neck supple.  Cardiovascular: Normal rate.   Pulmonary/Chest: Effort normal.   has only a few faint wheezes. Normal respiratory rate. No retractions.  Musculoskeletal: She exhibits no edema.  Lymphadenopathy:    She has no cervical adenopathy.  Neurological: She is alert.          Assessment & Plan:  Asthma. By history, suspect she has at least mild persistent if not moderate persistent. Depo-Medrol 80 mg IM given. Refill Xopenex for as needed use. Pulmicort 180 mcg 1 puff twice  a day with instructions for rinsing mouth afterwards.  Suggest follow up with primary in one month to reassess on the Pulmicort.

## 2013-10-10 ENCOUNTER — Telehealth: Payer: Self-pay

## 2013-10-10 NOTE — Telephone Encounter (Signed)
Pt wants to transfer to Dr. Elease Hashimoto. Okay per Dr. Elease Hashimoto. Is it okay with Dr. Raliegh Ip?

## 2013-10-13 ENCOUNTER — Ambulatory Visit (INDEPENDENT_AMBULATORY_CARE_PROVIDER_SITE_OTHER): Payer: 59 | Admitting: Family Medicine

## 2013-10-13 ENCOUNTER — Encounter: Payer: Self-pay | Admitting: Family Medicine

## 2013-10-13 VITALS — BP 130/80 | HR 84 | Wt 156.0 lb

## 2013-10-13 DIAGNOSIS — R05 Cough: Secondary | ICD-10-CM

## 2013-10-13 DIAGNOSIS — R059 Cough, unspecified: Secondary | ICD-10-CM

## 2013-10-13 DIAGNOSIS — R0989 Other specified symptoms and signs involving the circulatory and respiratory systems: Secondary | ICD-10-CM

## 2013-10-13 DIAGNOSIS — R0609 Other forms of dyspnea: Secondary | ICD-10-CM

## 2013-10-13 DIAGNOSIS — R0600 Dyspnea, unspecified: Secondary | ICD-10-CM

## 2013-10-13 MED ORDER — AZITHROMYCIN 250 MG PO TABS: ORAL_TABLET | ORAL | Status: DC

## 2013-10-13 NOTE — Progress Notes (Signed)
Subjective:    Patient ID: Marissa Horn, female    DOB: March 26, 1971, 43 y.o.   MRN: [id number]  Asthma She complains of cough, shortness of breath and wheezing. Pertinent negatives include no appetite change, chest pain or fever. Her past medical history is significant for asthma.   Patient seen for followup regarding cough and dyspnea. Refer to prior note. She has history of reported asthma and had recent exacerbation about a week and a half ago. She previously and had improvement with steroids and Xopenex. She is complaining of this time of some difficulties with getting good inhalation but no significant expiratory wheezing. No pleuritic pain. No hemoptysis. No fever. She has had per duct of cough which is progressed over the past week. Nonsmoker. We have placed her on Pulmicort recently because she was describing almost nightly cough and did have some very faint wheezing on exam last week. She does not filled Pulmicort has helped at this time.  Past Medical History  Diagnosis Date  . Allergy   . Arthritis     hands  . Dyslipidemia   . UTI (lower urinary tract infection)   . History of chicken pox   . Migraine   . Asthma     inhaler use as needed   . Complication of anesthesia 1990    muscle relaxant caused lung to collapse  . Difficult intubation 1990   Past Surgical History  Procedure Laterality Date  . Eye surgery    . Anal fissure repair    . Wisdom tooth extraction    . Abdominoplasty  05/04/2012    Procedure: ABDOMINOPLASTY;  Surgeon: Crissie Reese, MD;  Location: [facility];  Service: Plastics;  Laterality: N/A;  . Abdominal hysterectomy    . Gynecologic cryosurgery      reports that she has never smoked. She has never used smokeless tobacco. She reports that she does not drink alcohol or use illicit drugs. family history includes Arthritis in her father and mother; Glaucoma in her father and mother; Heart disease (age of onset: 74) in her father; Hyperlipidemia in her  brother, father, and mother; Thyroid disease in her sister. Allergies  Allergen Reactions  . Scopolamine Anaphylaxis  . Biaxin [Clarithromycin] Nausea And Vomiting    Projectile vomiting  . Tamiflu [Oseltamivir Phosphate] Nausea And Vomiting    Projectile vomiting  . Cephalexin Nausea And Vomiting  . Asa [Aspirin] Rash    Pt takes ibuprofen without problems      Review of Systems  Constitutional: Negative for fever, chills, appetite change and unexpected weight change.  HENT: Negative for congestion.   Respiratory: Positive for cough, shortness of breath and wheezing.   Cardiovascular: Negative for chest pain, palpitations and leg swelling.       Objective:   Physical Exam  Constitutional: She appears well-developed and well-nourished.  HENT:  Right Ear: External ear normal.  Left Ear: External ear normal.  Mouth/Throat: Oropharynx is clear and moist.  Neck: Neck supple.  Cardiovascular: Normal rate and regular rhythm.   Pulmonary/Chest: Effort normal and breath sounds normal. No respiratory distress. She has no wheezes. She has no rales.  Musculoskeletal: She exhibits no edema.  Lymphadenopathy:    She has no cervical adenopathy.          Assessment & Plan:  Patient presents with some persistent cough and subjective dyspnea. Her current pattern does not fit for classic asthma. She is describing inspiratory wheezing although her symptoms are improved with Xopenex inhaler. She has  not seen improvement with Pulmicort. She is describing progressive productive cough though no fever. Clinically, no suspicion for pneumonia. Add Zithromax. If symptoms not improving over the next week consider pulmonary referral. We've encouraged her to give Pulmicort least one more week

## 2013-10-13 NOTE — Progress Notes (Signed)
Pre visit review using our clinic review tool, if applicable. No additional management support is needed unless otherwise documented below in the visit note. 

## 2013-10-13 NOTE — Patient Instructions (Addendum)
  Follow up sooner for any fever or increasing shortness of breath. Touch base by early next week if no better.

## 2013-10-17 NOTE — Telephone Encounter (Signed)
ok 

## 2013-10-18 NOTE — Telephone Encounter (Signed)
Called the patient.  States she is transferring to Dr. Elease Hashimoto.

## 2013-12-20 ENCOUNTER — Other Ambulatory Visit: Payer: Self-pay

## 2013-12-20 DIAGNOSIS — Z1231 Encounter for screening mammogram for malignant neoplasm of breast: Secondary | ICD-10-CM

## 2013-12-27 ENCOUNTER — Encounter: Payer: Self-pay | Admitting: Family Medicine

## 2013-12-27 ENCOUNTER — Ambulatory Visit (INDEPENDENT_AMBULATORY_CARE_PROVIDER_SITE_OTHER): Payer: 59 | Admitting: Family Medicine

## 2013-12-27 VITALS — BP 120/70 | HR 77 | Temp 98.2°F | Wt 160.0 lb

## 2013-12-27 DIAGNOSIS — J45909 Unspecified asthma, uncomplicated: Secondary | ICD-10-CM

## 2013-12-27 DIAGNOSIS — J4540 Moderate persistent asthma, uncomplicated: Secondary | ICD-10-CM

## 2013-12-27 MED ORDER — BUDESONIDE-FORMOTEROL FUMARATE 80-4.5 MCG/ACT IN AERO: 2.0000 | INHALATION_SPRAY | Freq: Two times a day (BID) | RESPIRATORY_TRACT | Status: DC

## 2013-12-27 NOTE — Progress Notes (Signed)
Pre visit review using our clinic review tool, if applicable. No additional management support is needed unless otherwise documented below in the visit note. 

## 2013-12-27 NOTE — Patient Instructions (Signed)
Asthma, Adult Asthma is a recurring condition in which the airways tighten and narrow. Asthma can make it difficult to breathe. It can cause coughing, wheezing, and shortness of breath. Asthma episodes (also called asthma attacks) range from minor to life-threatening. Asthma cannot be cured, but medicines and lifestyle changes can help control it. CAUSES Asthma is believed to be caused by inherited (genetic) and environmental factors, but its exact cause is unknown. Asthma may be triggered by allergens, lung infections, or irritants in the air. Asthma triggers are different for each person. Common triggers include:   Animal dander.  Dust mites.  Cockroaches.  Pollen from trees or grass.  Mold.  Smoke.  Air pollutants such as dust, household cleaners, hair sprays, aerosol sprays, paint fumes, strong chemicals, or strong odors.  Cold air, weather changes, and winds (which increase molds and pollens in the air).  Strong emotional expressions such as crying or laughing hard.  Stress.  Certain medicines (such as aspirin) or types of drugs (such as beta-blockers).  Sulfites in foods and drinks. Foods and drinks that may contain sulfites include dried fruit, potato chips, and sparkling grape juice.  Infections or inflammatory conditions such as the flu, a cold, or an inflammation of the nasal membranes (rhinitis).  Gastroesophageal reflux disease (GERD).  Exercise or strenuous activity. SYMPTOMS Symptoms may occur immediately after asthma is triggered or many hours later. Symptoms include:  Wheezing.  Excessive nighttime or early morning coughing.  Frequent or severe coughing with a common cold.  Chest tightness.  Shortness of breath. DIAGNOSIS  The diagnosis of asthma is made by a review of your medical history and a physical exam. Tests may also be performed. These may include:  Lung function studies. These tests show how much air you breath in and out.  Allergy  tests.  Imaging tests such as X-rays. TREATMENT  Asthma cannot be cured, but it can usually be controlled. Treatment involves identifying and avoiding your asthma triggers. It also involves medicines. There are 2 classes of medicine used for asthma treatment:   Controller medicines. These prevent asthma symptoms from occurring. They are usually taken every day.  Reliever or rescue medicines. These quickly relieve asthma symptoms. They are used as needed and provide short-term relief. Your health care provider will help you create an asthma action plan. An asthma action plan is a written plan for managing and treating your asthma attacks. It includes a list of your asthma triggers and how they may be avoided. It also includes information on when medicines should be taken and when their dosage should be changed. An action plan may also involve the use of a device called a peak flow meter. A peak flow meter measures how well the lungs are working. It helps you monitor your condition. HOME CARE INSTRUCTIONS   Take medicine as directed by your health care provider. Speak with your health care provider if you have questions about how or when to take the medicines.  Use a peak flow meter as directed by your health care provider. Record and keep track of readings.  Understand and use the action plan to help minimize or stop an asthma attack without needing to seek medical care.  Control your home environment in the following ways to help prevent asthma attacks:  Do not smoke. Avoid being exposed to secondhand smoke.  Change your heating and air conditioning filter regularly.  Limit your use of fireplaces and wood stoves.  Get rid of pests (such as roaches and  mice) and their droppings.  Throw away plants if you see mold on them.  Clean your floors and dust regularly. Use unscented cleaning products.  Try to have someone else vacuum for you regularly. Stay out of rooms while they are being  vacuumed and for a short while afterward. If you vacuum, use a dust mask from a hardware store, a double-layered or microfilter vacuum cleaner bag, or a vacuum cleaner with a HEPA filter.  Replace carpet with wood, tile, or vinyl flooring. Carpet can trap dander and dust.  Use allergy-proof pillows, mattress covers, and box spring covers.  Wash bed sheets and blankets every week in hot water and dry them in a dryer.  Use blankets that are made of polyester or cotton.  Clean bathrooms and kitchens with bleach. If possible, have someone repaint the walls in these rooms with mold-resistant paint. Keep out of the rooms that are being cleaned and painted.  Wash hands frequently. SEEK MEDICAL CARE IF:   You have wheezing, shortness of breath, or a cough even if taking medicine to prevent attacks.  The colored mucus you cough up (sputum) is thicker than usual.  Your sputum changes from clear or white to yellow, green, gray, or bloody.  You have any problems that may be related to the medicines you are taking (such as a rash, itching, swelling, or trouble breathing).  You are using a reliever medicine more than 2 3 times per week.  Your peak flow is still at 50 79% of you personal best after following your action plan for 1 hour. SEEK IMMEDIATE MEDICAL CARE IF:   You seem to be getting worse and are unresponsive to treatment during an asthma attack.  You are short of breath even at rest.  You get short of breath when doing very little physical activity.  You have difficulty eating, drinking, or talking due to asthma symptoms.  You develop chest pain.  You develop a fast heartbeat.  You have a bluish color to your lips or fingernails.  You are lightheaded, dizzy, or faint.  Your peak flow is less than 50% of your personal best.  You have a fever or persistent symptoms for more than 2 3 days.  You have a fever and symptoms suddenly get worse. MAKE SURE YOU:   Understand these  instructions.  Will watch your condition.  Will get help right away if you are not doing well or get worse. Document Released: 07/21/2005 Document Revised: 03/23/2013 Document Reviewed: 02/17/2013 [facility] Patient Information 2014 [place], [place].  Call me in two weeks if symptoms not improving.

## 2013-12-27 NOTE — Progress Notes (Signed)
Subjective:    Patient ID: Marissa Horn, female    DOB: 1970-08-15, 43 y.o.   MRN: [id number]  Asthma She complains of cough and wheezing. Pertinent negatives include no chest pain or fever. Her past medical history is significant for asthma.   Patient seen for followup regarding asthma. She's been taking her Xopenex inhaler up to 3 times daily and states she generally wheezes 2-3 times a day. We had placed her on Pulmicort but she states she did not see any relief with that. She also states that she is taken [facility] and Advair previously but did not see good success with those. Patient states about 4 years ago she was treated with Symbicort which seemed to work well for her. She is aware that dust is one trigger. She takes great measures to reduce dust. Denies any recent fever. Nonsmoker  Past Medical History  Diagnosis Date  . Allergy   . Arthritis     hands  . Dyslipidemia   . UTI (lower urinary tract infection)   . History of chicken pox   . Migraine   . Asthma     inhaler use as needed   . Complication of anesthesia 1990    muscle relaxant caused lung to collapse  . Difficult intubation 1990   Past Surgical History  Procedure Laterality Date  . Eye surgery    . Anal fissure repair    . Wisdom tooth extraction    . Abdominoplasty  05/04/2012    Procedure: ABDOMINOPLASTY;  Surgeon: Crissie Reese, MD;  Location: [facility];  Service: Plastics;  Laterality: N/A;  . Abdominal hysterectomy    . Gynecologic cryosurgery      reports that she has never smoked. She has never used smokeless tobacco. She reports that she does not drink alcohol or use illicit drugs. family history includes Arthritis in her father and mother; Glaucoma in her father and mother; Heart disease (age of onset: 12) in her father; Hyperlipidemia in her brother, father, and mother; Thyroid disease in her sister. Allergies  Allergen Reactions  . Scopolamine Anaphylaxis  . Biaxin [Clarithromycin] Nausea And Vomiting      Projectile vomiting  . Tamiflu [Oseltamivir Phosphate] Nausea And Vomiting    Projectile vomiting  . Cephalexin Nausea And Vomiting  . Asa [Aspirin] Rash    Pt takes ibuprofen without problems      Review of Systems  Constitutional: Negative for fever, chills and unexpected weight change.  HENT: Negative for congestion.   Respiratory: Positive for cough and wheezing.   Cardiovascular: Negative for chest pain.       Objective:   Physical Exam  Constitutional: She appears well-developed and well-nourished.  HENT:  Right Ear: External ear normal.  Left Ear: External ear normal.  Mouth/Throat: Oropharynx is clear and moist.  Neck: Neck supple.  Cardiovascular: Normal rate, regular rhythm and normal heart sounds.  Exam reveals no gallop.   No murmur heard. Pulmonary/Chest: Effort normal and breath sounds normal. No respiratory distress. She has no wheezes. She has no rales.  Musculoskeletal: She exhibits no edema.  Lymphadenopathy:    She has no cervical adenopathy.          Assessment & Plan:  Asthma. By description, moderate persistent but no wheezing noted today. Discontinue Pulmicort. Start Symbicort 80 mg 2 puffs twice daily. Rinse mouth after use.  Continue Xopenex as needed. Touch base 2 weeks if not further improved.  Consider addition of Singulair if control not improving with the  above.

## 2014-01-27 ENCOUNTER — Ambulatory Visit: Payer: 59

## 2014-01-27 ENCOUNTER — Ambulatory Visit
Admission: RE | Admit: 2014-01-27 | Discharge: 2014-01-27 | Disposition: A | Discharge status: Home / Self Care | Payer: 59 | Source: Ambulatory Visit

## 2014-01-27 ENCOUNTER — Encounter (INDEPENDENT_AMBULATORY_CARE_PROVIDER_SITE_OTHER): Payer: Self-pay

## 2014-01-27 DIAGNOSIS — Z1231 Encounter for screening mammogram for malignant neoplasm of breast: Secondary | ICD-10-CM

## 2014-04-27 ENCOUNTER — Encounter: Payer: 59 | Admitting: Internal Medicine

## 2014-05-09 ENCOUNTER — Encounter: Payer: 59 | Admitting: Internal Medicine

## 2014-05-10 ENCOUNTER — Ambulatory Visit (INDEPENDENT_AMBULATORY_CARE_PROVIDER_SITE_OTHER): Payer: 59 | Admitting: Family Medicine

## 2014-05-10 ENCOUNTER — Encounter: Payer: Self-pay | Admitting: Internal Medicine

## 2014-05-10 ENCOUNTER — Encounter: Payer: Self-pay | Admitting: Family Medicine

## 2014-05-10 VITALS — BP 120/80 | HR 76 | Temp 97.9°F | Ht 61.0 in | Wt 156.0 lb

## 2014-05-10 DIAGNOSIS — K589 Irritable bowel syndrome without diarrhea: Secondary | ICD-10-CM

## 2014-05-10 DIAGNOSIS — Z23 Encounter for immunization: Secondary | ICD-10-CM

## 2014-05-10 DIAGNOSIS — Z0000 Encounter for general adult medical examination without abnormal findings: Secondary | ICD-10-CM

## 2014-05-10 DIAGNOSIS — E785 Hyperlipidemia, unspecified: Secondary | ICD-10-CM

## 2014-05-10 LAB — HEPATIC FUNCTION PANEL
ALT: 30 U/L (ref 0–35)
AST: 27 U/L (ref 0–37)
Albumin: 3.7 g/dL (ref 3.5–5.2)
Alkaline Phosphatase: 53 U/L (ref 39–117)
Bilirubin, Direct: 0 mg/dL (ref 0.0–0.3)
TOTAL PROTEIN: 7.7 g/dL (ref 6.0–8.3)
Total Bilirubin: 1 mg/dL (ref 0.2–1.2)

## 2014-05-10 LAB — CBC WITH DIFFERENTIAL/PLATELET
BASOS PCT: 1 % (ref 0.0–3.0)
Basophils Absolute: 0.1 10*3/uL (ref 0.0–0.1)
EOS ABS: 0.2 10*3/uL (ref 0.0–0.7)
EOS PCT: 2.2 % (ref 0.0–5.0)
HCT: 37.6 % (ref 36.0–46.0)
HEMOGLOBIN: 12.2 g/dL (ref 12.0–15.0)
LYMPHS PCT: 35.5 % (ref 12.0–46.0)
Lymphs Abs: 2.6 10*3/uL (ref 0.7–4.0)
MCHC: 32.4 g/dL (ref 30.0–36.0)
MCV: 84.8 fl (ref 78.0–100.0)
Monocytes Absolute: 0.5 10*3/uL (ref 0.1–1.0)
Monocytes Relative: 6.4 % (ref 3.0–12.0)
Neutro Abs: 4.1 10*3/uL (ref 1.4–7.7)
Neutrophils Relative %: 54.9 % (ref 43.0–77.0)
Platelets: 343 10*3/uL (ref 150.0–400.0)
RBC: 4.44 Mil/uL (ref 3.87–5.11)
RDW: 14.7 % (ref 11.5–15.5)
WBC: 7.4 10*3/uL (ref 4.0–10.5)

## 2014-05-10 LAB — BASIC METABOLIC PANEL
BUN: 4 mg/dL — AB (ref 6–23)
CO2: 23 mEq/L (ref 19–32)
CREATININE: 0.7 mg/dL (ref 0.4–1.2)
Calcium: 9.3 mg/dL (ref 8.4–10.5)
Chloride: 101 mEq/L (ref 96–112)
GFR: 106.07 mL/min (ref >60.00)
Glucose, Bld: 93 mg/dL (ref 70–99)
Potassium: 4.3 mEq/L (ref 3.5–5.1)
Sodium: 135 mEq/L (ref 135–145)

## 2014-05-10 LAB — LIPID PANEL
CHOL/HDL RATIO: 6
Cholesterol: 236 mg/dL — ABNORMAL HIGH (ref 0–200)
HDL: 37.7 mg/dL — ABNORMAL LOW (ref >39.00)
NonHDL: 198.3
Triglycerides: 217 mg/dL — ABNORMAL HIGH (ref 0.0–149.0)
VLDL: 43.4 mg/dL — ABNORMAL HIGH (ref 0.0–40.0)

## 2014-05-10 LAB — LDL CHOLESTEROL, DIRECT: LDL DIRECT: 154.1 mg/dL

## 2014-05-10 LAB — TSH: TSH: 2.28 u[IU]/mL (ref 0.35–4.50)

## 2014-05-10 MED ORDER — LEVOCETIRIZINE DIHYDROCHLORIDE 5 MG PO TABS: 5.0000 mg | ORAL_TABLET | Freq: Every evening | ORAL | Status: DC

## 2014-05-10 MED ORDER — LEVALBUTEROL TARTRATE 45 MCG/ACT IN AERO: 1.0000 | INHALATION_SPRAY | Freq: Four times a day (QID) | RESPIRATORY_TRACT | Status: DC | PRN

## 2014-05-10 MED ORDER — BUDESONIDE-FORMOTEROL FUMARATE 80-4.5 MCG/ACT IN AERO: 2.0000 | INHALATION_SPRAY | Freq: Two times a day (BID) | RESPIRATORY_TRACT | Status: DC

## 2014-05-10 NOTE — Patient Instructions (Signed)
Irritable Bowel Syndrome Irritable bowel syndrome (IBS) is caused by a disturbance of normal bowel function and is a common digestive disorder. You may also hear this condition called spastic colon, mucous colitis, and irritable colon. There is no cure for IBS. However, symptoms often gradually improve or disappear with a good diet, stress management, and medicine. This condition usually appears in late adolescence or early adulthood. Women develop it twice as often as men. CAUSES  After food has been digested and absorbed in the small intestine, waste material is moved into the large intestine, or colon. In the colon, water and salts are absorbed from the undigested products coming from the small intestine. The remaining residue, or fecal material, is held for elimination. Under normal circumstances, gentle, rhythmic contractions of the bowel walls push the fecal material along the colon toward the rectum. In IBS, however, these contractions are irregular and poorly coordinated. The fecal material is either retained too long, resulting in constipation, or expelled too soon, producing diarrhea. SIGNS AND SYMPTOMS  The most common symptom of IBS is abdominal pain. It is often in the lower left side of the abdomen, but it may occur anywhere in the abdomen. The pain comes from spasms of the bowel muscles happening too much and from the buildup of gas and fecal material in the colon. This pain:  Can range from sharp abdominal cramps to a dull, continuous ache.  Often worsens soon after eating.  Is often relieved by having a bowel movement or passing gas. Abdominal pain is usually accompanied by constipation, but it may also produce diarrhea. The diarrhea often occurs right after a meal or upon waking up in the morning. The stools are often soft, watery, and flecked with mucus. Other symptoms of IBS include:  Bloating.  Loss of appetite.  Heartburn.  Backache.  Dull pain in the arms or  shoulders.  Nausea.  Burping.  Vomiting.  Gas. IBS may also cause symptoms that are unrelated to the digestive system, such as:  Fatigue.  Headaches.  Anxiety.  Shortness of breath.  Trouble concentrating.  Dizziness. These symptoms tend to come and go. DIAGNOSIS  The symptoms of IBS may seem like symptoms of other, more serious digestive disorders. Your health care provider may want to perform tests to exclude these disorders.  TREATMENT Many medicines are available to help correct bowel function or relieve bowel spasms and abdominal pain. Among the medicines available are:  Laxatives for severe constipation and to help restore normal bowel habits.  Specific antidiarrheal medicines to treat severe or lasting diarrhea.  Antispasmodic agents to relieve intestinal cramps. Your health care provider may also decide to treat you with a mild tranquilizer or sedative during unusually stressful periods in your life. Your health care provider may also prescribe antidepressant medicine. The use of this medicine has been shown to reduce pain and other symptoms of IBS. Remember that if any medicine is prescribed for you, you should take it exactly as directed. Make sure your health care provider knows how well it worked for you. HOME CARE INSTRUCTIONS   Take all medicines as directed by your health care provider.  Avoid foods that are high in fat or oils, such as heavy cream, butter, frankfurters, sausage, and other fatty meats.  Avoid foods that make you go to the bathroom, such as fruit, fruit juice, and dairy products.  Cut out carbonated drinks, chewing gum, and "gassy" foods such as beans and cabbage. This may help relieve bloating and burping.  Eat foods with bran, and drink plenty of liquids with the bran foods. This helps relieve constipation.  Keep track of what foods seem to bring on your symptoms.  Avoid emotionally charged situations or circumstances that produce  anxiety.  Start or continue exercising.  Get plenty of rest and sleep. Document Released: 07/21/2005 Document Revised: 07/26/2013 Document Reviewed: 03/10/2008 [facility] Patient Information 2015 [place], [place]. This information is not intended to replace advice given to you by your health care provider. Make sure you discuss any questions you have with your health care provider.

## 2014-05-10 NOTE — Progress Notes (Signed)
Pre visit review using our clinic review tool, if applicable. No additional management support is needed unless otherwise documented below in the visit note. 

## 2014-05-10 NOTE — Progress Notes (Signed)
Subjective:    Patient ID: Marissa Horn, female    DOB: July 30, 1971, 43 y.o.   MRN: [id number]  HPI  Patient seen for complete physical. She sees gynecologist regularly. She had hysterectomy couple years ago secondary to menometrorrhagia. She has some ongoing allergy and asthma issues. She does not take prescribed dose of Symbicort but only taking 2 puffs once daily. She's taken Zyrtec but had sedation. Has tried Allegra and Claritin but these did not seem to help her allergy symptoms. She's never tried Singulair.  Nonsmoker. Sees gynecologist yearly. Recent mammogram. No consistent exercise.  History of reported IBS. She has probably diarrhea symptoms. Gets adequate fiber intake. Her symptoms tend to wax and wane.  No clear triggers.  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  Past Medical History  Diagnosis Date  . Allergy   . Arthritis     hands  . Dyslipidemia   . UTI (lower urinary tract infection)   . History of chicken pox   . Migraine   . Asthma     inhaler use as needed   . Complication of anesthesia 1990    muscle relaxant caused lung to collapse  . Difficult intubation 1990   Past Surgical History  Procedure Laterality Date  . Eye surgery    . Anal fissure repair    . Wisdom tooth extraction    . Abdominoplasty  05/04/2012    Procedure: ABDOMINOPLASTY;  Surgeon: Crissie Reese, MD;  Location: [facility];  Service: Plastics;  Laterality: N/A;  . Abdominal hysterectomy    . Gynecologic cryosurgery      reports that she has never smoked. She has never used smokeless tobacco. She reports that she does not drink alcohol or use illicit drugs. family history includes Arthritis in her father and mother; Glaucoma in her father and mother; Heart disease (age of onset: 72) in her father; Hyperlipidemia in her brother, father, and mother; Thyroid disease in her sister. Allergies  Allergen Reactions  . Scopolamine Anaphylaxis  . Biaxin [Clarithromycin]  Nausea And Vomiting    Projectile vomiting  . Tamiflu [Oseltamivir Phosphate] Nausea And Vomiting    Projectile vomiting  . Cephalexin Nausea And Vomiting  . Asa [Aspirin] Rash    Pt takes ibuprofen without problems      Review of Systems  Constitutional: Negative for fever, activity change, appetite change, fatigue and unexpected weight change.  HENT: Positive for congestion and postnasal drip. Negative for ear pain, hearing loss, sore throat and trouble swallowing.   Eyes: Negative for visual disturbance.  Respiratory: Negative for cough and shortness of breath.   Cardiovascular: Negative for chest pain and palpitations.  Gastrointestinal: Positive for diarrhea. Negative for abdominal pain, constipation and blood in stool.  Genitourinary: Negative for dysuria and hematuria.  Musculoskeletal: Negative for arthralgias, back pain and myalgias.  Skin: Negative for rash.  Neurological: Negative for dizziness, syncope and headaches.  Hematological: Negative for adenopathy.  Psychiatric/Behavioral: Negative for confusion and dysphoric mood.       Objective:   Physical Exam  Constitutional: She is oriented to person, place, and time. She appears well-developed and well-nourished.  HENT:  Head: Normocephalic and atraumatic.  Eyes: EOM are normal. Pupils are equal, round, and reactive to light.  Neck: Normal range of motion. Neck supple. No thyromegaly present.  Cardiovascular: Normal rate, regular rhythm and normal heart sounds.   No murmur heard. Pulmonary/Chest: Breath sounds normal. No respiratory distress. She has no wheezes. She has no rales.  Abdominal: Soft. Bowel sounds are normal. She exhibits no distension and no mass. There is no tenderness. There is no rebound and no guarding.  Genitourinary:  Per gyn  Musculoskeletal: Normal range of motion. She exhibits no edema.  Lymphadenopathy:    She has no cervical adenopathy.  Neurological: She is alert and oriented to person,  place, and time. She displays normal reflexes. No cranial nerve deficit.  Skin: No rash noted.  Psychiatric: She has a normal mood and affect. Her behavior is normal. Judgment and thought content normal.          Assessment & Plan:  Complete physical. Obtain screening labs. Flu vaccine given. Tetanus up-to-date. Mammograms up-to-date. Previous hysterectomy as above. We discussed the importance of exercise and weight loss.  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 We discussed possible GI referral to further evaluate and discuss possible treatment options. She appears to get adequate fiber

## 2014-06-05 ENCOUNTER — Encounter: Payer: Self-pay | Admitting: Family Medicine

## 2014-07-14 ENCOUNTER — Ambulatory Visit (INDEPENDENT_AMBULATORY_CARE_PROVIDER_SITE_OTHER): Payer: 59 | Admitting: Internal Medicine

## 2014-07-14 ENCOUNTER — Encounter: Payer: Self-pay | Admitting: Internal Medicine

## 2014-07-14 VITALS — BP 112/72 | HR 88 | Ht 60.25 in | Wt 153.2 lb

## 2014-07-14 DIAGNOSIS — K589 Irritable bowel syndrome without diarrhea: Secondary | ICD-10-CM

## 2014-07-14 DIAGNOSIS — J312 Chronic pharyngitis: Secondary | ICD-10-CM | POA: Insufficient documentation

## 2014-07-14 MED ORDER — RIFAXIMIN 550 MG PO TABS: 550.0000 mg | ORAL_TABLET | Freq: Three times a day (TID) | ORAL | Status: AC

## 2014-07-14 NOTE — Progress Notes (Signed)
Referred by Carolann Littler, MD Subjective:    Patient ID: Marissa Horn, female    DOB: 1971-05-22, 43 y.o.   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 She reports a multiple year history of intermittent episodic crampy abdominal pain using in the left lower quadrant area associated with urgent watery or loose defecation. She has not been able to determine any particular food as a trigger. She has avoided gluten, she has avoided dairy and no benefit. She had a long difficult pregnancy with her second child, was on bedrest and thought perhaps that had something to do with it because the symptoms seem to intensify after that. A hysterectomy made no difference. In the past year or so things been getting somewhat worse. She never sees rectal bleeding. Her weight is actually increased. She can go for a week without symptoms but not longer. She has had some bladder issues with urinary frequency etc. but is not a candidate for surgery yet, her urologist told her to wait on that so she did not have any type of bladder repair when she had her hysterectomy. He does have a brother with ulcerative colitis.  She has tried probiotics supplied by her husband who is a Lexicographer. They have not helped.  Other physicians have suggested anti-spasmodic spot there is a possible relationship to her allergy to scopolamine. When she was 82 and had a surgery in [place], she was given IV medication and apparently had scopolamine in it and she had some severe reaction in at that time was told her lungs collapsed and she had to be intubated.  Her other complaint is that she has intermittent episodes of what sound like aspirating saliva and coughing. She saw Dr. Constance Holster of nose and throat who could find no particular abnormalities to explain that, he did suggest trying H2 blocker to see if that made a difference but she  got no benefit when she uses that though she was not very consistent with it. She has a lot of hoarseness at times and has a sore throat. She does describe a chronic postnasal drip. Medications, allergies, past medical history, past surgical history, family history and social history are reviewed and updated in the EMR.   Review of Systems Positive for allergies, rare night sweats, urinary frequency or excessive urination and some urinary incontinence at times. Some dyspnea. She does have asthma. All other review of systems are negative or as in the history of present illness.    Objective:   Physical Exam General:  Well-developed, well-nourished and in no acute distress Eyes:  anicteric. ENT:   Mouth and posterior pharynx free of lesions.  Neck:   supple w/o thyromegaly or mass.  Lungs: Clear to auscultation bilaterally. Heart:  S1S2, no rubs, murmurs, gallops. Abdomen:  soft, non-tender, no hepatosplenomegaly, hernia, or mass and BS+.  Rectal: Lymph:  no cervical or supraclavicular adenopathy. Extremities:   no edema Skin   no rash. Neuro:  A&O x 3.  Psych:  appropriate mood and  Affect.   Data Reviewed: I reviewed the labs in the EMR to include a normal CBC electrolytes liver function tests and TSH. No relevant imaging.    Assessment & Plan:   1. IBS (irritable bowel syndrome)   2. Chronic sore throat    This is a pretty convincing story for irritable bowel syndrome. I did mention that  Crohn's disease could present this way but overall that seems unlikely given her scenario.  She may have a component of gastric soft reflux disease leading to her sore throat though it sounds more like a postnasal drip syndrome.  1. We'll treat with Xifaxan 550 mg 3 times a day for 2 weeks to see if that makes a difference since I think she has irritable bowel syndrome, diarrhea predominant. 2. I have suggested she try a PPI daily for 2 months to see if that makes a difference in her throat  symptoms. She will consider this. 3. It sounds like an anti-spasmodic could help her but there is this question of overlap with this scopolamine allergy. I will try to investigate the scopolamine issue further. I wonder if she had respiratory depression or it caused a reactive airways problem (she has asthma). Seems unlikely that an oral anti-cholinergic like hyoscyamine would precipitate the same thing. 4. Consider FODMAPS diet, Lotronex, or new agent Viberzi 5. If the Xifaxan fails to work, consider inflammatory markers like fecal calprotectin and lactoferrin plus possible serologic markers versus a colonoscopy.  I appreciate the opportunity to care for this patient.  CC: Eulas Post, MD

## 2014-07-14 NOTE — Patient Instructions (Addendum)
We have sent the following medications to your pharmacy for you to pick up at your convenience: xifaxan   Call us next month with an update on how your doing please.   Dr Carlean Purl said you may want to try an over the counter PPI called Prilosec once daily 30 minutes before breakfast for 2 months.   I appreciate the opportunity to care for you.

## 2014-09-15 ENCOUNTER — Encounter: Payer: Self-pay | Admitting: Obstetrics & Gynecology

## 2014-09-15 ENCOUNTER — Ambulatory Visit: Payer: 59 | Admitting: Obstetrics & Gynecology

## 2014-09-15 ENCOUNTER — Ambulatory Visit (INDEPENDENT_AMBULATORY_CARE_PROVIDER_SITE_OTHER): Payer: 59 | Admitting: Obstetrics & Gynecology

## 2014-09-15 VITALS — BP 124/84 | HR 60 | Resp 16 | Ht 60.25 in | Wt 154.0 lb

## 2014-09-15 DIAGNOSIS — Z0000 Encounter for general adult medical examination without abnormal findings: Secondary | ICD-10-CM

## 2014-09-15 DIAGNOSIS — Z01419 Encounter for gynecological examination (general) (routine) without abnormal findings: Secondary | ICD-10-CM

## 2014-09-15 LAB — POCT URINALYSIS DIPSTICK
Bilirubin, UA: NEGATIVE
Blood, UA: NEGATIVE
Glucose, UA: NEGATIVE
Ketones, UA: NEGATIVE
Leukocytes, UA: NEGATIVE
Nitrite, UA: NEGATIVE
PROTEIN UA: NEGATIVE
UROBILINOGEN UA: NEGATIVE
pH, UA: 5

## 2014-09-15 NOTE — Progress Notes (Signed)
44 y.o. G3P2 Married Indian/Asian F here for annual exam.  Doing well.  Pt reports last was a really good year for her asthma.  Going to [place] for spring break.  Kids are excited about this.     Patient's last menstrual period was 04/04/2012.          Sexually active: Yes.    The current method of family planning is status post hysterectomy.    Exercising: Yes.    yoga-irregularly Smoker:  no  Health Maintenance: Pap:  01/08/12-normal History of abnormal Pap:  yes MMG:  01/27/14 3D-normal Colonoscopy:  none BMD:   none TDaP:  9/14 Screening Labs: PCP, Hb today: PCP, Urine today: negative   reports that she has never smoked. She has never used smokeless tobacco. She reports that she does not drink alcohol or use illicit drugs.  Past Medical History  Diagnosis Date  . Allergy   . Arthritis     hands  . Dyslipidemia   . UTI (lower urinary tract infection)   . History of chicken pox   . Migraine   . Asthma     ?, inhaler use as needed  . Complication of anesthesia 1990    muscle relaxant caused lung to collapse  . Difficult intubation 1990  . IBS (irritable bowel syndrome)     ?  [name] Kitchen Anemia     Past Surgical History  Procedure Laterality Date  . Eye surgery Left     age 53  . Anal fissure repair      age 65  . Wisdom tooth extraction    . Abdominoplasty  05/04/2012    Procedure: ABDOMINOPLASTY;  Surgeon: Crissie Reese, MD;  Location: [facility];  Service: Plastics;  Laterality: N/A;  . Gynecologic cryosurgery      Current Outpatient Prescriptions  Medication Sig Dispense Refill  . budesonide-formoterol (SYMBICORT) 80-4.5 MCG/ACT inhaler Inhale 2 puffs into the lungs 2 (two) times daily. 1 Inhaler 12  . ketotifen (ZADITOR) 0.025 % ophthalmic solution Place 1 drop into both eyes 2 (two) times daily.    [name] Kitchen levalbuterol (XOPENEX HFA) 45 MCG/ACT inhaler Inhale 1-2 puffs into the lungs every 6 (six) hours as needed for wheezing. 1 Inhaler 5  . levocetirizine (XYZAL) 5 MG tablet  Take 1 tablet (5 mg total) by mouth every evening. (Patient taking differently: Take 5 mg by mouth as needed. ) 30 tablet 11   No current facility-administered medications for this visit.    Family History  Problem Relation Age of Onset  . Arthritis Mother   . Hyperlipidemia Mother   . Hyperlipidemia Father   . Heart disease Father 26    CAD  . Thyroid disease Sister   . Hyperlipidemia Brother   . Glaucoma Mother   . Ulcerative colitis Brother   . Irritable bowel syndrome Mother   . Cataracts Father   . Cataracts Mother     ROS:  Pertinent items are noted in HPI.  Otherwise, a comprehensive ROS was negative.  Exam:   BP 124/84 mmHg  Pulse 60  Resp 16  Ht 5' 0.25" (1.53 m)  Wt 154 lb (69.854 kg)  BMI 29.84 kg/m2  LMP 04/04/2012  Weight: +4#  Height: 5' 0.25" (153 cm)  Ht Readings from Last 3 Encounters:  09/15/14 5' 0.25" (1.53 m)  07/14/14 5' 0.25" (1.53 m)  05/10/14 5' 1"  (1.549 m)    General appearance: alert, cooperative and appears stated age Head: Normocephalic, without obvious abnormality, atraumatic  Neck: no adenopathy, supple, symmetrical, trachea midline and thyroid normal to inspection and palpation Lungs: clear to auscultation bilaterally Breasts: normal appearance, no masses or tenderness Heart: regular rate and rhythm Abdomen: soft, non-tender; bowel sounds normal; no masses,  no organomegaly Extremities: extremities normal, atraumatic, no cyanosis or edema Skin: Skin color, texture, turgor normal. No rashes or lesions Lymph nodes: Cervical, supraclavicular, and axillary nodes normal. No abnormal inguinal nodes palpated Neurologic: Grossly normal   Pelvic: External genitalia:  no lesions              Urethra:  normal appearing urethra with no masses, tenderness or lesions              Bartholins and Skenes: normal                 Vagina: normal appearing vagina with normal color and discharge, no lesions              Cervix: no lesions               Pap taken: No. Bimanual Exam:  Uterus:  uterus absent              Adnexa: normal adnexa and no mass, fullness, tenderness               Rectovaginal: Confirms               Anus:  normal sphincter tone, no lesions  Chaperone was present for exam.  A:  Well Woman with normal exam S/p robotic assisted TLH/bilateral salpingectomy H/O elevated lipids Long hx of anemia resolved after hysterectomy  P: Mammogram yearly. pap smear not indicated. Labs yearly with Dr. Elease Hashimoto.  Cholesterol is being followed closely.  return annually or prn

## 2014-09-25 ENCOUNTER — Ambulatory Visit: Payer: 59 | Admitting: Family Medicine

## 2014-09-25 ENCOUNTER — Other Ambulatory Visit: Payer: Self-pay

## 2014-09-25 ENCOUNTER — Encounter: Payer: Self-pay | Admitting: Family Medicine

## 2014-09-25 ENCOUNTER — Ambulatory Visit (INDEPENDENT_AMBULATORY_CARE_PROVIDER_SITE_OTHER): Payer: 59 | Admitting: Family Medicine

## 2014-09-25 ENCOUNTER — Telehealth: Payer: Self-pay | Admitting: Family Medicine

## 2014-09-25 VITALS — BP 124/80 | HR 88 | Temp 98.0°F | Wt 154.0 lb

## 2014-09-25 DIAGNOSIS — J4540 Moderate persistent asthma, uncomplicated: Secondary | ICD-10-CM

## 2014-09-25 DIAGNOSIS — J309 Allergic rhinitis, unspecified: Secondary | ICD-10-CM

## 2014-09-25 DIAGNOSIS — J3089 Other allergic rhinitis: Secondary | ICD-10-CM

## 2014-09-25 DIAGNOSIS — J45909 Unspecified asthma, uncomplicated: Secondary | ICD-10-CM

## 2014-09-25 MED ORDER — LEVALBUTEROL HCL 1.25 MG/3ML IN NEBU: 1.2500 mg | INHALATION_SOLUTION | RESPIRATORY_TRACT | Status: DC | PRN

## 2014-09-25 MED ORDER — LEVALBUTEROL TARTRATE 45 MCG/ACT IN AERO: 1.0000 | INHALATION_SPRAY | Freq: Four times a day (QID) | RESPIRATORY_TRACT | Status: DC | PRN

## 2014-09-25 MED ORDER — LEVOCETIRIZINE DIHYDROCHLORIDE 5 MG PO TABS: 5.0000 mg | ORAL_TABLET | ORAL | Status: DC | PRN

## 2014-09-25 MED ORDER — METHYLPREDNISOLONE ACETATE 80 MG/ML IJ SUSP
80.0000 mg | Freq: Once | INTRAMUSCULAR | Status: AC
  Administered 2014-09-25: 80 mg via INTRAMUSCULAR

## 2014-09-25 MED ORDER — LEVOCETIRIZINE DIHYDROCHLORIDE 5 MG PO TABS: 5.0000 mg | ORAL_TABLET | Freq: Every day | ORAL | Status: DC | PRN

## 2014-09-25 MED ORDER — BUDESONIDE-FORMOTEROL FUMARATE 80-4.5 MCG/ACT IN AERO: 2.0000 | INHALATION_SPRAY | Freq: Two times a day (BID) | RESPIRATORY_TRACT | Status: DC

## 2014-09-25 MED ORDER — MOMETASONE FUROATE 0.1 % EX CREA: 1.0000 "application " | TOPICAL_CREAM | Freq: Every day | CUTANEOUS | Status: DC

## 2014-09-25 MED ORDER — MOMETASONE FUROATE 50 MCG/ACT NA SUSP: 2.0000 | Freq: Every day | NASAL | Status: DC

## 2014-09-25 MED ORDER — BUDESONIDE-FORMOTEROL FUMARATE 80-4.5 MCG/ACT IN AERO
2.0000 | INHALATION_SPRAY | Freq: Two times a day (BID) | RESPIRATORY_TRACT | Status: DC
Start: 2014-09-25 — End: 2015-07-25

## 2014-09-25 NOTE — Progress Notes (Signed)
Pre visit review using our clinic review tool, if applicable. No additional management support is needed unless otherwise documented below in the visit note. 

## 2014-09-25 NOTE — Telephone Encounter (Signed)
No open slots.

## 2014-09-25 NOTE — Progress Notes (Signed)
   Subjective:    Patient ID: Marissa Horn, female    DOB: 1971-08-02, 44 y.o.   MRN: [id number]  HPI Patient seen with asthma flareup. She takes Symbicort though has not been taking this regularly. She uses Xopenex as rescue inhaler. Last Thursday she was around some cigarette smoke and she thinks was a trigger for her symptoms. By the next day she had sore throat and some mild nasal congestion. She had increased wheezing and some cough over the weekend. She has previously responded well to steroids and requesting Depo-Medrol specifically. She also needs refills of several medications. She takes levocetirizine for allergies and requesting refills. She has history of seborrheic eczema and requests topical mometasone. She also needs refills of Symbicort and Nasonex. Denies any recent fever. Nonsmoker.  Past Medical History  Diagnosis Date  . Allergy   . Arthritis     hands  . Dyslipidemia   . UTI (lower urinary tract infection)   . History of chicken pox   . Migraine   . Asthma     ?, inhaler use as needed  . Complication of anesthesia 1990    muscle relaxant caused lung to collapse  . Difficult intubation 1990  . IBS (irritable bowel syndrome)     ?  [name] Kitchen Anemia    Past Surgical History  Procedure Laterality Date  . Eye surgery Left     age 13  . Anal fissure repair      age 81  . Wisdom tooth extraction    . Abdominoplasty  05/04/2012    Procedure: ABDOMINOPLASTY;  Surgeon: Crissie Reese, MD;  Location: [facility];  Service: Plastics;  Laterality: N/A;  . Gynecologic cryosurgery      reports that she has never smoked. She has never used smokeless tobacco. She reports that she does not drink alcohol or use illicit drugs. family history includes Arthritis in her mother; Cataracts in her father and mother; Glaucoma in her mother; Heart disease (age of onset: 43) in her father; Hyperlipidemia in her brother, father, and mother; Irritable bowel syndrome in her mother; Thyroid disease in her  sister; Ulcerative colitis in her brother. Allergies  Allergen Reactions  . Scopolamine Anaphylaxis  . Biaxin [Clarithromycin] Nausea And Vomiting    Projectile vomiting  . Tamiflu [Oseltamivir Phosphate] Nausea And Vomiting    Projectile vomiting  . Cephalexin Nausea And Vomiting  . Asa [Aspirin] Rash    Pt takes ibuprofen without problems      Review of Systems  Constitutional: Negative for fever, chills and fatigue.  HENT: Positive for congestion and sore throat.   Respiratory: Positive for cough and wheezing.   Cardiovascular: Negative for chest pain, palpitations and leg swelling.       Objective:   Physical Exam  Constitutional: She appears well-developed and well-nourished.  HENT:  Mouth/Throat: Oropharynx is clear and moist.  Neck: Neck supple. No thyromegaly present.  Cardiovascular: Normal rate and regular rhythm.   Pulmonary/Chest: Effort normal and breath sounds normal. No respiratory distress. She has no wheezes. She has no rales.  Musculoskeletal: She exhibits no edema.          Assessment & Plan:  Asthma with recent exacerbation. Her lungs are actually fairly clear today. She is very concerned because some intermittent wheezing she's noted over past few days.. Depo-Medrol 80 mg IM given. Refill Nasonex and Xopenex. We also gave her refills of levocetirizine and Symbicort. She is requesting Xopenex for her nebulizer. Prescription given.

## 2014-09-25 NOTE — Patient Instructions (Signed)

## 2014-09-25 NOTE — Telephone Encounter (Signed)
Pt is sch for 3:30 PM today but she would like to be seen at 11am if possible. She is having asthma issues today.

## 2014-09-26 DIAGNOSIS — J3089 Other allergic rhinitis: Secondary | ICD-10-CM | POA: Insufficient documentation

## 2015-02-23 ENCOUNTER — Other Ambulatory Visit: Payer: Self-pay

## 2015-02-23 DIAGNOSIS — Z1231 Encounter for screening mammogram for malignant neoplasm of breast: Secondary | ICD-10-CM

## 2015-03-15 ENCOUNTER — Ambulatory Visit (INDEPENDENT_AMBULATORY_CARE_PROVIDER_SITE_OTHER): Payer: 59 | Admitting: Family Medicine

## 2015-03-15 ENCOUNTER — Encounter: Payer: Self-pay | Admitting: Family Medicine

## 2015-03-15 VITALS — BP 136/90 | Temp 98.4°F | Wt 159.0 lb

## 2015-03-15 DIAGNOSIS — M542 Cervicalgia: Secondary | ICD-10-CM | POA: Diagnosis not present

## 2015-03-15 DIAGNOSIS — M549 Dorsalgia, unspecified: Secondary | ICD-10-CM

## 2015-03-15 DIAGNOSIS — M546 Pain in thoracic spine: Secondary | ICD-10-CM | POA: Diagnosis not present

## 2015-03-15 MED ORDER — METHOCARBAMOL 500 MG PO TABS: 500.0000 mg | ORAL_TABLET | Freq: Four times a day (QID) | ORAL | Status: DC | PRN

## 2015-03-15 NOTE — Progress Notes (Signed)
Subjective:    Patient ID: Marissa Horn, female    DOB: 04-22-1971, 44 y.o.   MRN: [id number]  HPI Acute visit. Patient seen with some headache and neck pain and upper back pain. She states last weekend she had sore throat and some myalgias. Those symptoms have improved. She now has headache that is somewhat asymmetric right parietal area but mostly occipital area. She thinks this is radiating from stiff and sore muscles in her neck. No injury. She's taken Advil without relief. She took one leftover diazepam which did not seem to help much. She denies any radiculopathy symptoms. No upper extremity numbness or weakness.  Past Medical History  Diagnosis Date  . Allergy   . Arthritis     hands  . Dyslipidemia   . UTI (lower urinary tract infection)   . History of chicken pox   . Migraine   . Asthma     ?, inhaler use as needed  . Complication of anesthesia 1990    muscle relaxant caused lung to collapse  . Difficult intubation 1990  . IBS (irritable bowel syndrome)     ?  [name] Kitchen Anemia    Past Surgical History  Procedure Laterality Date  . Eye surgery Left     age 38  . Anal fissure repair      age 31  . Wisdom tooth extraction    . Abdominoplasty  05/04/2012    Procedure: ABDOMINOPLASTY;  Surgeon: Crissie Reese, MD;  Location: [facility];  Service: Plastics;  Laterality: N/A;  . Gynecologic cryosurgery      reports that she has never smoked. She has never used smokeless tobacco. She reports that she does not drink alcohol or use illicit drugs. family history includes Arthritis in her mother; Cataracts in her father and mother; Glaucoma in her mother; Heart disease (age of onset: 21) in her father; Hyperlipidemia in her brother, father, and mother; Irritable bowel syndrome in her mother; Thyroid disease in her sister; Ulcerative colitis in her brother. Allergies  Allergen Reactions  . Scopolamine Anaphylaxis  . Biaxin [Clarithromycin] Nausea And Vomiting    Projectile vomiting  .  Tamiflu [Oseltamivir Phosphate] Nausea And Vomiting    Projectile vomiting  . Cephalexin Nausea And Vomiting  . Asa [Aspirin] Rash    Pt takes ibuprofen without problems      Review of Systems  Constitutional: Negative for fever, chills, appetite change and unexpected weight change.  Eyes: Negative for visual disturbance.  Respiratory: Negative for shortness of breath.   Cardiovascular: Negative for chest pain.  Neurological: Positive for headaches. Negative for weakness and numbness.       Objective:   Physical Exam  Constitutional: She is oriented to person, place, and time. She appears well-developed and well-nourished. No distress.  Cardiovascular: Normal rate and regular rhythm.   Pulmonary/Chest: Effort normal and breath sounds normal. No respiratory distress. She has no wheezes. She has no rales.  Musculoskeletal: She exhibits no edema.  Full range of motion cervical spine. She has some tenderness near the attachment of the cervical muscles to occipital scalp. Some increased trapezius tenderness bilaterally  Neurological: She is alert and oriented to person, place, and time. No cranial nerve deficit.  Full strength upper extremities with symmetric reflexes          Assessment & Plan:  Neck pain and upper back pain. Suspect muscular. Suspect tension-type headache. Try moist heat. Robaxin 500 mg every 6 hours as needed. Consider muscle massage. Touch base  3-4 days if not improving.

## 2015-03-15 NOTE — Progress Notes (Signed)
Pre visit review using our clinic review tool, if applicable. No additional management support is needed unless otherwise documented below in the visit note. 

## 2015-03-15 NOTE — Patient Instructions (Signed)
Try some moist heat to neck and consider muscle massage Touch base in a few days if headaches no better.

## 2015-03-23 ENCOUNTER — Ambulatory Visit (INDEPENDENT_AMBULATORY_CARE_PROVIDER_SITE_OTHER): Payer: 59 | Admitting: Family Medicine

## 2015-03-23 ENCOUNTER — Encounter: Payer: Self-pay | Admitting: Family Medicine

## 2015-03-23 VITALS — BP 132/90 | HR 92 | Temp 98.4°F | Wt 158.0 lb

## 2015-03-23 DIAGNOSIS — R519 Headache, unspecified: Secondary | ICD-10-CM

## 2015-03-23 DIAGNOSIS — R51 Headache: Secondary | ICD-10-CM | POA: Diagnosis not present

## 2015-03-23 MED ORDER — DIAZEPAM 5 MG PO TABS: 5.0000 mg | ORAL_TABLET | Freq: Two times a day (BID) | ORAL | Status: DC | PRN

## 2015-03-23 NOTE — Progress Notes (Signed)
Pre visit review using our clinic review tool, if applicable. No additional management support is needed unless otherwise documented below in the visit note. 

## 2015-03-23 NOTE — Progress Notes (Signed)
Subjective:    Patient ID: Marissa Horn, female    DOB: 25-Aug-1970, 44 y.o.   MRN: [id number]  HPI  patient seen for follow-up regarding atypical headache. Refer to prior note. We suspected she may have some  element of muscle contraction type headache. We put her on Robaxin and her upper back pain is improved and she also went for a massage. However, her headache has progressed. She has daily headache which does wax and wane somewhat but has never gone away completely. She describes a right occipital headache which is sharp and 8 out of 10 severity at its worst. No clear provoking factors. She's been waking up during the past week with headache. She's had past history of migraines but states this is different. No visual changes. Decreased appetite. No fevers or chills. She's been taking Advil which helped slightly but does not relieve her headache.   she relates that she had a "concussion" last fall but has had none since then and no recent head injury.  Past Medical History  Diagnosis Date  . Allergy   . Arthritis     hands  . Dyslipidemia   . UTI (lower urinary tract infection)   . History of chicken pox   . Migraine   . Asthma     ?, inhaler use as needed  . Complication of anesthesia 1990    muscle relaxant caused lung to collapse  . Difficult intubation 1990  . IBS (irritable bowel syndrome)     ?  [name] Kitchen Anemia    Past Surgical History  Procedure Laterality Date  . Eye surgery Left     age 49  . Anal fissure repair      age 40  . Wisdom tooth extraction    . Abdominoplasty  05/04/2012    Procedure: ABDOMINOPLASTY;  Surgeon: Crissie Reese, MD;  Location: [facility];  Service: Plastics;  Laterality: N/A;  . Gynecologic cryosurgery      reports that she has never smoked. She has never used smokeless tobacco. She reports that she does not drink alcohol or use illicit drugs. family history includes Arthritis in her mother; Cataracts in her father and mother; Glaucoma in her mother;  Heart disease (age of onset: 44) in her father; Hyperlipidemia in her brother, father, and mother; Irritable bowel syndrome in her mother; Thyroid disease in her sister; Ulcerative colitis in her brother. Allergies  Allergen Reactions  . Scopolamine Anaphylaxis  . Biaxin [Clarithromycin] Nausea And Vomiting    Projectile vomiting  . Tamiflu [Oseltamivir Phosphate] Nausea And Vomiting    Projectile vomiting  . Cephalexin Nausea And Vomiting  . Asa [Aspirin] Rash    Pt takes ibuprofen without problems      Review of Systems  Constitutional: Positive for appetite change.  Eyes: Negative for visual disturbance.  Respiratory: Negative for cough and shortness of breath.   Cardiovascular: Negative for chest pain.  Neurological: Positive for headaches. Negative for seizures and weakness.  Psychiatric/Behavioral: Negative for confusion.       Objective:   Physical Exam  Constitutional: She is oriented to person, place, and time. She appears well-developed and well-nourished.  HENT:  Right Ear: External ear normal.  Left Ear: External ear normal.  Eyes: Pupils are equal, round, and reactive to light.  Neck: Normal range of motion. Neck supple.  Cardiovascular: Normal rate and regular rhythm.   Pulmonary/Chest: Effort normal and breath sounds normal. No respiratory distress. She has no wheezes. She has no rales.  Neurological: She is alert and oriented to person, place, and time. No cranial nerve deficit. Coordination normal.  No focal strength deficits.          Assessment & Plan:   Atypical unilateral headache right occipital area. Not relieved with conservative therapy above. Atypical features include the fact this is unilateral and not typical for migraine and has been daily and progressive over 2 weeks. Set up MRI brain with contrast to further evaluate.  We elected not to do CT since no hx of recent trauma.

## 2015-03-26 ENCOUNTER — Other Ambulatory Visit: Payer: Self-pay | Admitting: Family Medicine

## 2015-03-26 ENCOUNTER — Telehealth: Payer: Self-pay | Admitting: Family Medicine

## 2015-03-26 DIAGNOSIS — R519 Headache, unspecified: Secondary | ICD-10-CM

## 2015-03-26 DIAGNOSIS — R51 Headache: Principal | ICD-10-CM

## 2015-03-26 NOTE — Telephone Encounter (Signed)
Pt would like to resch her mri a week later. Pt is sch for tomorrow at [facility] on [address] Pt would like to know if this is ok with md. Pt is having mri due to headaches.

## 2015-03-26 NOTE — Telephone Encounter (Signed)
Pt's MRI is supposed to be w/ and w/out contract.   Even though notes reflect w/ and w/out, The order does not.  Mia states it needs to be changed today or pt may not be able to be seen at 9am in the morning. Can you help w/ this? Thank you!

## 2015-03-26 NOTE — Telephone Encounter (Signed)
Pt informed

## 2015-03-26 NOTE — Telephone Encounter (Signed)
yes

## 2015-03-26 NOTE — Telephone Encounter (Signed)
Marissa Horn entered new orders.

## 2015-03-27 ENCOUNTER — Inpatient Hospital Stay: Admission: RE | Admit: 2015-03-27 | Payer: 59 | Source: Ambulatory Visit

## 2015-03-30 ENCOUNTER — Ambulatory Visit
Admission: RE | Admit: 2015-03-30 | Discharge: 2015-03-30 | Disposition: A | Discharge status: Home / Self Care | Payer: 59 | Source: Ambulatory Visit

## 2015-03-30 DIAGNOSIS — Z1231 Encounter for screening mammogram for malignant neoplasm of breast: Secondary | ICD-10-CM

## 2015-04-05 ENCOUNTER — Ambulatory Visit
Admission: RE | Admit: 2015-04-05 | Discharge: 2015-04-05 | Disposition: A | Discharge status: Home / Self Care | Payer: 59 | Source: Ambulatory Visit | Attending: Family Medicine | Admitting: Family Medicine

## 2015-04-05 DIAGNOSIS — R51 Headache: Principal | ICD-10-CM

## 2015-04-05 DIAGNOSIS — R519 Headache, unspecified: Secondary | ICD-10-CM

## 2015-04-06 ENCOUNTER — Other Ambulatory Visit: Payer: Self-pay | Admitting: Family Medicine

## 2015-04-06 ENCOUNTER — Other Ambulatory Visit: Payer: Self-pay

## 2015-04-06 DIAGNOSIS — R519 Headache, unspecified: Secondary | ICD-10-CM

## 2015-04-06 DIAGNOSIS — R51 Headache: Principal | ICD-10-CM

## 2015-04-06 MED ORDER — NORTRIPTYLINE HCL 10 MG PO CAPS: 10.0000 mg | ORAL_CAPSULE | Freq: Every day | ORAL | Status: DC

## 2015-05-08 ENCOUNTER — Ambulatory Visit: Payer: 59 | Admitting: Neurology

## 2015-07-19 ENCOUNTER — Other Ambulatory Visit (INDEPENDENT_AMBULATORY_CARE_PROVIDER_SITE_OTHER): Payer: 59

## 2015-07-19 DIAGNOSIS — Z0000 Encounter for general adult medical examination without abnormal findings: Secondary | ICD-10-CM | POA: Diagnosis not present

## 2015-07-19 DIAGNOSIS — R7989 Other specified abnormal findings of blood chemistry: Secondary | ICD-10-CM

## 2015-07-19 LAB — BASIC METABOLIC PANEL
BUN: 6 mg/dL (ref 6–23)
CHLORIDE: 102 meq/L (ref 96–112)
CO2: 29 meq/L (ref 19–32)
CREATININE: 0.66 mg/dL (ref 0.40–1.20)
Calcium: 9.1 mg/dL (ref 8.4–10.5)
GFR: 103.63 mL/min (ref >60.00)
GLUCOSE: 110 mg/dL — AB (ref 70–99)
POTASSIUM: 4.5 meq/L (ref 3.5–5.1)
Sodium: 137 mEq/L (ref 135–145)

## 2015-07-19 LAB — CBC WITH DIFFERENTIAL/PLATELET
BASOS PCT: 0.8 % (ref 0.0–3.0)
Basophils Absolute: 0 10*3/uL (ref 0.0–0.1)
EOS ABS: 0.1 10*3/uL (ref 0.0–0.7)
EOS PCT: 2.4 % (ref 0.0–5.0)
HCT: 36.8 % (ref 36.0–46.0)
HEMOGLOBIN: 12.2 g/dL (ref 12.0–15.0)
LYMPHS ABS: 2 10*3/uL (ref 0.7–4.0)
Lymphocytes Relative: 32 % (ref 12.0–46.0)
MCHC: 33.1 g/dL (ref 30.0–36.0)
MCV: 84.3 fl (ref 78.0–100.0)
MONO ABS: 0.4 10*3/uL (ref 0.1–1.0)
Monocytes Relative: 6.6 % (ref 3.0–12.0)
NEUTROS ABS: 3.6 10*3/uL (ref 1.4–7.7)
Neutrophils Relative %: 58.2 % (ref 43.0–77.0)
PLATELETS: 329 10*3/uL (ref 150.0–400.0)
RBC: 4.36 Mil/uL (ref 3.87–5.11)
RDW: 14.4 % (ref 11.5–15.5)
WBC: 6.2 10*3/uL (ref 4.0–10.5)

## 2015-07-19 LAB — LDL CHOLESTEROL, DIRECT: Direct LDL: 155 mg/dL

## 2015-07-19 LAB — VITAMIN D 25 HYDROXY (VIT D DEFICIENCY, FRACTURES): VITD: 7.22 ng/mL — AB (ref 30.00–100.00)

## 2015-07-19 LAB — HEPATIC FUNCTION PANEL
ALT: 22 U/L (ref 0–35)
AST: 17 U/L (ref 0–37)
Albumin: 4.2 g/dL (ref 3.5–5.2)
Alkaline Phosphatase: 54 U/L (ref 39–117)
BILIRUBIN TOTAL: 1.2 mg/dL (ref 0.2–1.2)
Bilirubin, Direct: 0.1 mg/dL (ref 0.0–0.3)
Total Protein: 6.9 g/dL (ref 6.0–8.3)

## 2015-07-19 LAB — LIPID PANEL
CHOLESTEROL: 243 mg/dL — AB (ref 0–200)
HDL: 39.7 mg/dL (ref >39.00)
NonHDL: 203.4
TRIGLYCERIDES: 247 mg/dL — AB (ref 0.0–149.0)
Total CHOL/HDL Ratio: 6
VLDL: 49.4 mg/dL — AB (ref 0.0–40.0)

## 2015-07-19 LAB — VITAMIN B12: Vitamin B-12: 149 pg/mL — ABNORMAL LOW (ref 211–911)

## 2015-07-19 LAB — TSH: TSH: 2.67 u[IU]/mL (ref 0.35–4.50)

## 2015-07-25 ENCOUNTER — Ambulatory Visit (INDEPENDENT_AMBULATORY_CARE_PROVIDER_SITE_OTHER): Payer: 59 | Admitting: Family Medicine

## 2015-07-25 ENCOUNTER — Encounter: Payer: Self-pay | Admitting: Family Medicine

## 2015-07-25 VITALS — BP 128/90 | HR 82 | Temp 98.1°F | Resp 14 | Ht 60.5 in | Wt 155.8 lb

## 2015-07-25 DIAGNOSIS — E559 Vitamin D deficiency, unspecified: Secondary | ICD-10-CM | POA: Diagnosis not present

## 2015-07-25 DIAGNOSIS — E538 Deficiency of other specified B group vitamins: Secondary | ICD-10-CM

## 2015-07-25 DIAGNOSIS — Z0000 Encounter for general adult medical examination without abnormal findings: Secondary | ICD-10-CM | POA: Diagnosis not present

## 2015-07-25 MED ORDER — CYANOCOBALAMIN 1000 MCG/ML IJ SOLN
1000.0000 ug | Freq: Once | INTRAMUSCULAR | Status: AC
  Administered 2015-07-25: 1000 ug via INTRAMUSCULAR

## 2015-07-25 MED ORDER — "NEEDLE (DISP) 25G X 5/8"" MISC": Status: AC

## 2015-07-25 MED ORDER — CYANOCOBALAMIN 1000 MCG/ML IJ SOLN: INTRAMUSCULAR | Status: DC

## 2015-07-25 MED ORDER — MOMETASONE FUROATE 50 MCG/ACT NA SUSP: 2.0000 | Freq: Every day | NASAL | Status: DC

## 2015-07-25 MED ORDER — ERGOCALCIFEROL 1.25 MG (50000 UT) PO CAPS: 50000.0000 [IU] | ORAL_CAPSULE | ORAL | Status: DC

## 2015-07-25 MED ORDER — BUDESONIDE-FORMOTEROL FUMARATE 80-4.5 MCG/ACT IN AERO: 2.0000 | INHALATION_SPRAY | Freq: Two times a day (BID) | RESPIRATORY_TRACT | Status: DC

## 2015-07-25 MED ORDER — LEVALBUTEROL TARTRATE 45 MCG/ACT IN AERO: 1.0000 | INHALATION_SPRAY | Freq: Four times a day (QID) | RESPIRATORY_TRACT | Status: DC | PRN

## 2015-07-25 MED ORDER — LEVOCETIRIZINE DIHYDROCHLORIDE 5 MG PO TABS: 5.0000 mg | ORAL_TABLET | Freq: Every day | ORAL | Status: DC | PRN

## 2015-07-25 NOTE — Progress Notes (Signed)
Subjective:    Patient ID: Marissa Horn, female    DOB: 1970-10-16, 44 y.o.   MRN: [id number]  HPI Patient here for complete physical. She still sees gynecologist regularly. She's had previous hysterectomy and does not get Pap smears. Chronic problems include perennial allergies, history of asthma, remote history of anemia, IBS, and dyslipidemia. Never smoked. In consistent with Symbicort use. Takes several allergy medications and requesting refills.  No consistent exercise. No family history of type 2 diabetes. Father had premature CAD history  She relates past history of B12 deficiency. She states she took several oral supplements but this never seemed to help (poor absorption). She is not a strict vegetarian but avoids all meats. She does eat some eggs and occasional dairy products such as milk  Past Medical History  Diagnosis Date  . Allergy   . Arthritis     hands  . Dyslipidemia   . UTI (lower urinary tract infection)   . History of chicken pox   . Migraine   . Asthma     ?, inhaler use as needed  . Complication of anesthesia 1990    muscle relaxant caused lung to collapse  . Difficult intubation 1990  . IBS (irritable bowel syndrome)     ?  [name] Kitchen Anemia    Past Surgical History  Procedure Laterality Date  . Eye surgery Left     age 84  . Anal fissure repair      age 69  . Wisdom tooth extraction    . Abdominoplasty  05/04/2012    Procedure: ABDOMINOPLASTY;  Surgeon: Crissie Reese, MD;  Location: [facility];  Service: Plastics;  Laterality: N/A;  . Gynecologic cryosurgery      reports that she has never smoked. She has never used smokeless tobacco. She reports that she does not drink alcohol or use illicit drugs. family history includes Arthritis in her mother; Cataracts in her father and mother; Glaucoma in her mother; Heart disease (age of onset: 15) in her father; Hyperlipidemia in her brother, father, and mother; Irritable bowel syndrome in her mother; Thyroid disease  in her sister; Ulcerative colitis in her brother. Allergies  Allergen Reactions  . Scopolamine Anaphylaxis  . Biaxin [Clarithromycin] Nausea And Vomiting    Projectile vomiting  . Tamiflu [Oseltamivir Phosphate] Nausea And Vomiting    Projectile vomiting  . Cephalexin Nausea And Vomiting  . Asa [Aspirin] Rash    Pt takes ibuprofen without problems      Review of Systems  Constitutional: Positive for fatigue. Negative for fever, activity change, appetite change and unexpected weight change.  HENT: Negative for ear pain, hearing loss, sore throat and trouble swallowing.   Eyes: Negative for visual disturbance.  Respiratory: Negative for cough and shortness of breath.   Cardiovascular: Negative for chest pain and palpitations.  Gastrointestinal: Negative for abdominal pain, diarrhea, constipation and blood in stool.  Endocrine: Negative for polydipsia and polyuria.  Genitourinary: Negative for dysuria and hematuria.  Musculoskeletal: Negative for myalgias, back pain and arthralgias.  Skin: Negative for rash.  Neurological: Negative for dizziness, syncope and headaches.  Hematological: Negative for adenopathy.  Psychiatric/Behavioral: Negative for confusion and dysphoric mood.       Objective:   Physical Exam  Constitutional: She is oriented to person, place, and time. She appears well-developed and well-nourished.  HENT:  Head: Normocephalic and atraumatic.  Eyes: EOM are normal. Pupils are equal, round, and reactive to light.  Neck: Normal range of motion. Neck supple. No  thyromegaly present.  Cardiovascular: Normal rate, regular rhythm and normal heart sounds.   No murmur heard. Pulmonary/Chest: Breath sounds normal. No respiratory distress. She has no wheezes. She has no rales.  Abdominal: Soft. Bowel sounds are normal. She exhibits no distension and no mass. There is no tenderness. There is no rebound and no guarding.  Genitourinary:  Per gyn  Musculoskeletal: Normal  range of motion. She exhibits no edema.  Lymphadenopathy:    She has no cervical adenopathy.  Neurological: She is alert and oriented to person, place, and time. She displays normal reflexes. No cranial nerve deficit.  Skin: No rash noted.  Psychiatric: She has a normal mood and affect. Her behavior is normal. Judgment and thought content normal.          Assessment & Plan:  Physical exam. Labs reviewed. Glucose 110. We recommended some weight loss and more consistent exercise. Consider hemoglobin A1c at follow-up. She also has dyslipidemia and meets criteria for metabolic syndrome. We discussed implications. We discussed low saturated and trans-fat diet  Vitamin D deficiency.  Start vitamin D 50,000 international units once weekly and recheck level in 3 months  B12 deficiency. Start B12 1000 g IM once weekly for 4 weeks then every other week for 4 weeks and then once monthly. Recheck B12 level in 3 months.

## 2015-07-25 NOTE — Progress Notes (Signed)
Pre visit review using our clinic review tool, if applicable. No additional management support is needed unless otherwise documented below in the visit note. 

## 2015-07-25 NOTE — Patient Instructions (Addendum)
Take B12 1000 mcg IM once weekly for one month and then every other week for one month, and then once monthly... Vit D 50,000 IU once weeklyLet's plan to repeat Vit D and B12 levels in about 3 months.  Food Choices to Lower Your Triglycerides Triglycerides are a type of fat in your blood. High levels of triglycerides can increase the risk of heart disease and stroke. If your triglyceride levels are high, the foods you eat and your eating habits are very important. Choosing the right foods can help lower your triglycerides.  WHAT GENERAL GUIDELINES DO I NEED TO FOLLOW?  Lose weight if you are overweight.   Limit or avoid alcohol.   Fill one half of your plate with vegetables and green salads.   Limit fruit to two servings a day. Choose fruit instead of juice.   Make one fourth of your plate whole grains. Look for the word "whole" as the first word in the ingredient list.  Fill one fourth of your plate with lean protein foods.  Enjoy fatty fish (such as salmon, mackerel, sardines, and tuna) three times a week.   Choose healthy fats.   Limit foods high in starch and sugar.  Eat more home-cooked food and less restaurant, buffet, and fast food.  Limit fried foods.  Cook foods using methods other than frying.  Limit saturated fats.  Check ingredient lists to avoid foods with partially hydrogenated oils (trans fats) in them. WHAT FOODS CAN I EAT?  Grains Whole grains, such as whole wheat or whole grain breads, crackers, cereals, and pasta. Unsweetened oatmeal, bulgur, barley, quinoa, or brown rice. Corn or whole wheat flour tortillas.  Vegetables Fresh or frozen vegetables (raw, steamed, roasted, or grilled). Green salads. Fruits All fresh, canned (in natural juice), or frozen fruits. Meat and Other Protein Products Ground beef (85% or leaner), grass-fed beef, or beef trimmed of fat. Skinless chicken or [place]. Ground chicken or [place]. Pork trimmed of fat. All fish and  seafood. Eggs. Dried beans, peas, or lentils. Unsalted nuts or seeds. Unsalted canned or dry beans. Dairy Low-fat dairy products, such as skim or 1% milk, 2% or reduced-fat cheeses, low-fat ricotta or cottage cheese, or plain low-fat yogurt. Fats and Oils Tub margarines without trans fats. Light or reduced-fat mayonnaise and salad dressings. Avocado. Safflower, olive, or canola oils. Natural peanut or almond butter. The items listed above may not be a complete list of recommended foods or beverages. Contact your dietitian for more options. WHAT FOODS ARE NOT RECOMMENDED?  Grains White bread. White pasta. White rice. Cornbread. Bagels, pastries, and croissants. Crackers that contain trans fat. Vegetables White potatoes. Corn. Creamed or fried vegetables. Vegetables in a cheese sauce. Fruits Dried fruits. Canned fruit in light or heavy syrup. Fruit juice. Meat and Other Protein Products Fatty cuts of meat. Ribs, chicken wings, bacon, sausage, bologna, salami, chitterlings, fatback, hot dogs, bratwurst, and packaged luncheon meats. Dairy Whole or 2% milk, cream, half-and-half, and cream cheese. Whole-fat or sweetened yogurt. Full-fat cheeses. Nondairy creamers and whipped toppings. Processed cheese, cheese spreads, or cheese curds. Sweets and Desserts Corn syrup, sugars, honey, and molasses. Candy. Jam and jelly. Syrup. Sweetened cereals. Cookies, pies, cakes, donuts, muffins, and ice cream. Fats and Oils Butter, stick margarine, lard, shortening, ghee, or bacon fat. Coconut, palm kernel, or palm oils. Beverages Alcohol. Sweetened drinks (such as sodas, lemonade, and fruit drinks or punches). The items listed above may not be a complete list of foods and beverages to avoid. Contact  your dietitian for more information.   This information is not intended to replace advice given to you by your health care provider. Make sure you discuss any questions you have with your health care provider.    Document Released: 05/08/2004 Document Revised: 08/11/2014 Document Reviewed: 05/25/2013 Elsevier Interactive Patient Education [employer].

## 2015-09-11 ENCOUNTER — Telehealth: Payer: Self-pay | Admitting: Family Medicine

## 2015-09-11 DIAGNOSIS — E538 Deficiency of other specified B group vitamins: Secondary | ICD-10-CM

## 2015-09-11 DIAGNOSIS — Z789 Other specified health status: Secondary | ICD-10-CM

## 2015-09-11 DIAGNOSIS — E559 Vitamin D deficiency, unspecified: Secondary | ICD-10-CM

## 2015-09-11 NOTE — Telephone Encounter (Signed)
Patient would like a call from the [facility] concerning her B12 Injections.

## 2015-09-13 NOTE — Telephone Encounter (Signed)
L/m for patient to call back

## 2015-09-18 MED FILL — HYDROCODON-APAP 5-325: 5-325 | 2 days supply | Qty: 10 | Fill #0

## 2015-09-18 MED FILL — IBUPROFEN 600 MG TABLET: 600 | 4 days supply | Qty: 16 | Fill #0

## 2015-09-20 ENCOUNTER — Other Ambulatory Visit: Payer: Self-pay | Admitting: Family Medicine

## 2015-09-20 DIAGNOSIS — E559 Vitamin D deficiency, unspecified: Secondary | ICD-10-CM

## 2015-09-20 NOTE — Telephone Encounter (Signed)
OK to add Zinc level.

## 2015-09-20 NOTE — Telephone Encounter (Signed)
Spoke with pt about her B12 injections. She has been doing them weekly since December. I told her not to take any more injection this month and start in March with them [facility]. Pt does have a pending appt on 3/21 for a lab only appt for Vitamin D, Vitamin B12. Pt would like to add on a zinc level due to her being a vegetarian. Please advise.

## 2015-09-20 NOTE — Telephone Encounter (Signed)
Orders entered

## 2015-09-21 ENCOUNTER — Ambulatory Visit (INDEPENDENT_AMBULATORY_CARE_PROVIDER_SITE_OTHER): Payer: 59 | Admitting: Obstetrics & Gynecology

## 2015-09-21 ENCOUNTER — Encounter: Payer: Self-pay | Admitting: Obstetrics & Gynecology

## 2015-09-21 VITALS — BP 120/70 | HR 80 | Resp 16 | Ht 60.5 in | Wt 155.0 lb

## 2015-09-21 DIAGNOSIS — Z01419 Encounter for gynecological examination (general) (routine) without abnormal findings: Secondary | ICD-10-CM | POA: Diagnosis not present

## 2015-09-21 NOTE — Progress Notes (Signed)
Patient ID: Marissa Horn, female   DOB: April 25, 1971, 45 y.o.   MRN: [id number]   45 y.o. G3P2 Married [place] F here for annual exam and for complaint for vaginal "protrusion" that she noted yesterday.  It is not tender.  Pt states she can "tug on it" but it doesn't hurt.  No vaginal bleeding and no vaginal discharge.  Had some atypical headaches last year.  H/O migraines.  Had brain MRI 9/16 which was negative.  Was referred to neurology.  Didn't go.  Pamelor recommended.  Pt never took this.  PCP:  Dr. Elease Hashimoto  Patient's last menstrual period was 04/04/2012.          Sexually active: Yes.    The current method of family planning is status post hysterectomy.    Exercising: no  Smoker:  no  Health Maintenance: Pap:  01/08/12 normal History of abnormal Pap:  Yes, remote hx MMG:  8/16 neg Colonoscopy:  never BMD:   never TDaP:  9/14 Screening Labs: PCP, Hb today: PCP, Urine today: PCPq 2qq   reports that she has never smoked. She has never used smokeless tobacco. She reports that she does not drink alcohol or use illicit drugs.  Past Medical History  Diagnosis Date  . Allergy   . Arthritis     hands  . Dyslipidemia   . UTI (lower urinary tract infection)   . History of chicken pox   . Migraine   . Asthma     ?, inhaler use as needed  . Complication of anesthesia 1990    muscle relaxant caused lung to collapse  . Difficult intubation 1990  . IBS (irritable bowel syndrome)     ?  [name] Kitchen Anemia     Past Surgical History  Procedure Laterality Date  . Eye surgery Left     age 62  . Anal fissure repair      age 80  . Wisdom tooth extraction    . Abdominoplasty  05/04/2012    Procedure: ABDOMINOPLASTY;  Surgeon: Crissie Reese, MD;  Location: [facility];  Service: Plastics;  Laterality: N/A;  . Gynecologic cryosurgery      Current Outpatient Prescriptions  Medication Sig Dispense Refill  . budesonide-formoterol (SYMBICORT) 80-4.5 MCG/ACT inhaler Inhale 2 puffs into the lungs 2  (two) times daily. 1 Inhaler 12  . cyanocobalamin (,VITAMIN B-12,) 1000 MCG/ML injection Take B12 1,000 mcg IM once weekly for 4 weeks, once every other week for one month then once monthy. 10 mL 6  . ergocalciferol (VITAMIN D2) 50000 UNITS capsule Take 1 capsule (50,000 Units total) by mouth once a week. 12 capsule 1  . ketotifen (ZADITOR) 0.025 % ophthalmic solution Place 1 drop into both eyes 2 (two) times daily.    [name] Kitchen levalbuterol (XOPENEX HFA) 45 MCG/ACT inhaler Inhale 1-2 puffs into the lungs every 6 (six) hours as needed for wheezing. 1 Inhaler 2  . levalbuterol (XOPENEX) 1.25 MG/3ML nebulizer solution Take 1.25 mg by nebulization every 4 (four) hours as needed for wheezing. 72 mL 12  . levocetirizine (XYZAL) 5 MG tablet Take 1 tablet (5 mg total) by mouth daily as needed. 30 tablet 11  . mometasone (ELOCON) 0.1 % cream Apply 1 application topically daily. 45 g 2  . mometasone (NASONEX) 50 MCG/ACT nasal spray Place 2 sprays into the nose daily. 17 g 12  . NEEDLE, DISP, 25 G (BD ECLIPSE NEEDLE) 25G X 5/8" MISC Use as directed. 10 each 5   No current facility-administered  medications for this visit.    Family History  Problem Relation Age of Onset  . Arthritis Mother   . Hyperlipidemia Mother   . Hyperlipidemia Father   . Heart disease Father 21    CAD  . Thyroid disease Sister   . Hyperlipidemia Brother   . Glaucoma Mother   . Ulcerative colitis Brother   . Irritable bowel syndrome Mother   . Cataracts Father   . Cataracts Mother     ROS:  Pertinent items are noted in HPI.  Otherwise, a comprehensive ROS was negative.  Exam:   BP 120/70 mmHg  Pulse 80  Resp 16  Ht 5' 0.5" (1.537 m)  Wt 155 lb (70.308 kg)  BMI 29.76 kg/m2  LMP 04/04/2012  Weight change: +1#   Height: 5' 0.5" (153.7 cm)  Ht Readings from Last 3 Encounters:  09/21/15 5' 0.5" (1.537 m)  07/25/15 5' 0.5" (1.537 m)  09/15/14 5' 0.25" (1.53 m)    General appearance: alert, cooperative and appears stated  age Head: Normocephalic, without obvious abnormality, atraumatic Neck: no adenopathy, supple, symmetrical, trachea midline and thyroid normal to inspection and palpation Lungs: clear to auscultation bilaterally Breasts: normal appearance, no masses or tenderness Heart: regular rate and rhythm Abdomen: soft, non-tender; bowel sounds normal; no masses,  no organomegaly Extremities: extremities normal, atraumatic, no cyanosis or edema Skin: Skin color, texture, turgor normal. No rashes or lesions Lymph nodes: Cervical, supraclavicular, and axillary nodes normal. No abnormal inguinal nodes palpated Neurologic: Grossly normal   Pelvic: External genitalia:  no lesions              Urethra:  normal appearing urethra with no masses, tenderness or lesions              Bartholins and Skenes: normal                 Vagina: normal appearing vagina with normal color and discharge, 3-37m probable inclusion cyst to the pt's right of midline.  This is along her prior episiotomy line, second degree rectocele              Cervix: absent              Pap taken: No. Bimanual Exam:  Uterus:  uterus absent              Adnexa: normal adnexa               Rectovaginal: Confirms               Anus:  normal sphincter tone, no lesions  Chaperone was present for exam.  A:  Well Woman with normal exam S/p robotic assisted TLH/bilateral salpingectomy 10/13 H/O elevated lipids followed by Dr. BElease HashimotoLong hx of anemia resolved after hysterectomy  P: Mammogram yearly pap smear not indicated. Labs yearly with Dr. BElease Hashimoto Cholesterol is being followed closely.  Vaginal lesion can be monitored and would only remove if enlarges.  Information provided to pt.   return annually or prn

## 2015-10-09 ENCOUNTER — Telehealth: Payer: Self-pay | Admitting: Obstetrics & Gynecology

## 2015-10-09 NOTE — Telephone Encounter (Signed)
Routing to Dr. Quincy Simmonds. Patient declined a call from the nurse.  Okay to close?

## 2015-10-09 NOTE — Telephone Encounter (Signed)
Patient left a message on the answering machine asking for an appointment with [facility]. No details given. I left patient a message to call when ready to schedule.

## 2015-10-09 NOTE — Telephone Encounter (Signed)
Patient left a message on the answering machine 10/08/2015 asking for an appointment with Dr. Sabra Heck for a problem. No further details. I left patient a message to call with more information and I would schedule an appointment for her. Patient called back and said " I think the problem has resolved. I noticed a little blood in my stool and I will call and schedule with Dr.Miller If I notice more blood". I asked patient if it would be okay to have a nurse call her back. Patient said "It is not necessary, I will call if I need an appointment."

## 2015-10-10 MED FILL — CYANOCOBALAMIN 1,000 MCG/ML: 1000 | 84 days supply | Qty: 7 | Fill #1

## 2015-10-10 NOTE — Telephone Encounter (Signed)
I have closed the encounter.

## 2015-10-12 MED FILL — BD LUER-LOK SYR 3 ML 25GX5/: 25G X 5/8" | 90 days supply | Qty: 10 | Fill #1

## 2015-10-23 ENCOUNTER — Other Ambulatory Visit (INDEPENDENT_AMBULATORY_CARE_PROVIDER_SITE_OTHER): Payer: 59

## 2015-10-23 DIAGNOSIS — E538 Deficiency of other specified B group vitamins: Secondary | ICD-10-CM | POA: Diagnosis not present

## 2015-10-23 DIAGNOSIS — E559 Vitamin D deficiency, unspecified: Secondary | ICD-10-CM | POA: Diagnosis not present

## 2015-10-23 DIAGNOSIS — Z789 Other specified health status: Secondary | ICD-10-CM | POA: Diagnosis not present

## 2015-10-23 LAB — VITAMIN D 25 HYDROXY (VIT D DEFICIENCY, FRACTURES): VITD: 26.13 ng/mL — ABNORMAL LOW (ref 30.00–100.00)

## 2015-10-23 LAB — VITAMIN B12: VITAMIN B 12: 313 pg/mL (ref 211–911)

## 2015-10-25 LAB — ZINC: Zinc: 61 ug/dL (ref 60–130)

## 2015-11-12 MED FILL — VIT D2 1.25 MG (50,000 UNIT: 1.25 MG | 84 days supply | Qty: 12 | Fill #1

## 2015-11-12 MED FILL — LEVOCETIRIZINE 5 MG TABLET: 5 | 30 days supply | Qty: 30 | Fill #1

## 2015-11-23 ENCOUNTER — Ambulatory Visit: Payer: 59 | Admitting: Obstetrics & Gynecology

## 2015-11-28 ENCOUNTER — Ambulatory Visit (INDEPENDENT_AMBULATORY_CARE_PROVIDER_SITE_OTHER): Payer: 59 | Admitting: Family Medicine

## 2015-11-28 VITALS — BP 118/78 | HR 94 | Temp 98.5°F | Ht 60.5 in | Wt 153.0 lb

## 2015-11-28 DIAGNOSIS — T490X5A Adverse effect of local antifungal, anti-infective and anti-inflammatory drugs, initial encounter: Secondary | ICD-10-CM | POA: Diagnosis not present

## 2015-11-28 DIAGNOSIS — R739 Hyperglycemia, unspecified: Secondary | ICD-10-CM

## 2015-11-28 DIAGNOSIS — T380X5A Adverse effect of glucocorticoids and synthetic analogues, initial encounter: Secondary | ICD-10-CM

## 2015-11-28 DIAGNOSIS — J45901 Unspecified asthma with (acute) exacerbation: Secondary | ICD-10-CM

## 2015-11-28 LAB — HEMOGLOBIN A1C: Hgb A1c MFr Bld: 6.7 % — ABNORMAL HIGH (ref 4.6–6.5)

## 2015-11-28 LAB — GLUCOSE, POCT (MANUAL RESULT ENTRY): POC GLUCOSE: 104 mg/dL — AB (ref 70–99)

## 2015-11-28 NOTE — Progress Notes (Signed)
Subjective:    Patient ID: Marissa Horn, female    DOB: 1971-01-27, 45 y.o.   MRN: [id number]  HPI  Patient has history of asthma.  She states she woke up 11 days ago with severe wheezing and shortness of breath at night.  Her husband is a pediatrician and apparently he gave her a shot of Rocephin and some kind of intramuscular steroid. She states within 30 minutes her breathing was improving. She already has home nebulizer with duoneb which is not helping much. She was subsequently given prednisone by mouth 40 mg daily for 5 days. Denied any fevers or chills. She has Symbicort but is not taking regularly. She states she has very infrequent flareups of asthma. She is aware that dust as a trigger for her. Nonsmoker. She did have some recent productive cough but at this point minimal cough. She still feels she may be wheezing somewhat intermittently   She finished the steroid couple days ago. She states she has felt somewhat flushed and with diffuse head and facial pressure since then. No purulent nasal secretions. She had some increased thirst on taking prednisone but no urine frequency. Denies any recent acute GERD symptoms.  Past Medical History  Diagnosis Date  . Allergy   . Arthritis     hands  . Dyslipidemia   . UTI (lower urinary tract infection)   . History of chicken pox   . Migraine   . Asthma     ?, inhaler use as needed  . Complication of anesthesia 1990    muscle relaxant caused lung to collapse  . Difficult intubation 1990  . IBS (irritable bowel syndrome)     ?  [name] Kitchen Anemia    Past Surgical History  Procedure Laterality Date  . Eye surgery Left     age 77  . Anal fissure repair      age 58  . Wisdom tooth extraction    . Abdominoplasty  05/04/2012    Procedure: ABDOMINOPLASTY;  Surgeon: Crissie Reese, MD;  Location: [facility];  Service: Plastics;  Laterality: N/A;  . Gynecologic cryosurgery      reports that she has never smoked. She has never used smokeless tobacco.  She reports that she does not drink alcohol or use illicit drugs. family history includes Arthritis in her mother; Cataracts in her father and mother; Glaucoma in her mother; Heart disease (age of onset: 81) in her father; Hyperlipidemia in her brother, father, and mother; Irritable bowel syndrome in her mother; Thyroid disease in her sister; Ulcerative colitis in her brother. Allergies  Allergen Reactions  . Scopolamine Anaphylaxis  . Biaxin [Clarithromycin] Nausea And Vomiting    Projectile vomiting  . Tamiflu [Oseltamivir Phosphate] Nausea And Vomiting    Projectile vomiting  . Cephalexin Nausea And Vomiting  . Asa [Aspirin] Rash    Pt takes ibuprofen without problems      Review of Systems  Constitutional: Positive for fatigue.  HENT: Negative for sinus pressure and sore throat.   Respiratory: Negative for cough and shortness of breath.   Cardiovascular: Negative for chest pain.  Gastrointestinal: Negative for abdominal pain.  Endocrine: Negative for polydipsia and polyuria.  Neurological: Positive for weakness ( No focal weakness). Negative for dizziness.  Psychiatric/Behavioral: Negative for confusion.       Objective:   Physical Exam  Constitutional: She is oriented to person, place, and time. She appears well-developed and well-nourished.  HENT:  Right Ear: External ear normal.  Left Ear: External  ear normal.  Mouth/Throat: Oropharynx is clear and moist.  Neck: Neck supple. No thyromegaly present.  Cardiovascular: Normal rate and regular rhythm.   Pulmonary/Chest: Effort normal and breath sounds normal. No respiratory distress. She has no wheezes. She has no rales.  Musculoskeletal: She exhibits no edema.  Lymphadenopathy:    She has no cervical adenopathy.  Neurological: She is alert and oriented to person, place, and time. No cranial nerve deficit.          Assessment & Plan:  Recent acute exacerbation of asthma. She has infrequent flare-ups of asthma but  sometimes has severe exacerbations. We recommended getting back on Symbicort 2 puffs twice daily. Blood sugar today fasting 104. She was concerned initially about elevated blood pressure but repeat reading left arm seated 118/78. Reassurance given.  Pt requesting A1[facility] and this will be checked.

## 2015-11-28 NOTE — Progress Notes (Signed)
Pre visit review using our clinic review tool, if applicable. No additional management support is needed unless otherwise documented below in the visit note. 

## 2015-11-28 NOTE — Patient Instructions (Signed)
Get back on Symbicort 2 puffs twice daily.

## 2015-11-29 ENCOUNTER — Other Ambulatory Visit: Payer: Self-pay | Admitting: Family Medicine

## 2015-11-29 DIAGNOSIS — R739 Hyperglycemia, unspecified: Secondary | ICD-10-CM | POA: Insufficient documentation

## 2015-12-18 MED FILL — SYMBICORT 80-4.5 MCG INH: 80-4.5 | 30 days supply | Qty: 10 | Fill #1

## 2016-01-28 ENCOUNTER — Other Ambulatory Visit (INDEPENDENT_AMBULATORY_CARE_PROVIDER_SITE_OTHER): Payer: 59

## 2016-01-28 DIAGNOSIS — E559 Vitamin D deficiency, unspecified: Secondary | ICD-10-CM | POA: Diagnosis not present

## 2016-01-28 DIAGNOSIS — E538 Deficiency of other specified B group vitamins: Secondary | ICD-10-CM

## 2016-01-28 LAB — VITAMIN B12: VITAMIN B 12: 263 pg/mL (ref 211–911)

## 2016-01-28 LAB — VITAMIN D 25 HYDROXY (VIT D DEFICIENCY, FRACTURES): VITD: 30.24 ng/mL (ref 30.00–100.00)

## 2016-01-29 ENCOUNTER — Telehealth: Payer: Self-pay

## 2016-01-29 MED ORDER — ERGOCALCIFEROL 1.25 MG (50000 UT) PO CAPS: 50000.0000 [IU] | ORAL_CAPSULE | ORAL | Status: DC

## 2016-01-29 MED ORDER — CYANOCOBALAMIN 1000 MCG/ML IJ SOLN: INTRAMUSCULAR | Status: DC

## 2016-01-29 NOTE — Telephone Encounter (Addendum)
Spoke to patient. Gave results and instructions. Patient states she is still taking B12 monthly. Concerned she may need to increase. Wants to inform Dr. Elease Hashimoto that she is a vegetarian. Patient will schedule 6 month vit d and b12 lab appt at [facility] recheck lab appt on 03/04/16.

## 2016-01-29 NOTE — Telephone Encounter (Signed)
Called patient. Gave instructions. Patient verbalized understanding. Requested refills.

## 2016-01-29 NOTE — Telephone Encounter (Signed)
-----   Message from Eulas Post, MD sent at 01/28/2016  4:37 PM EDT ----- B12 level is normal but low end of normal. Make sure she is still taking B12 monthly Vitamin D level is just barely normal range now. This has steadily improved over the past 6 months. Recommend she continue with vitamin D 50,000 international units once weekly Recommend consider repeat vitamin D and B12 levels in 6 months

## 2016-01-29 NOTE — Telephone Encounter (Signed)
I would have her give B12 every 2 weeks for 2 months and then return back to once monthly.

## 2016-01-31 LAB — VITAMIN D 1,25 DIHYDROXY
Vitamin D 1, 25 (OH)2 Total: 84 pg/mL — ABNORMAL HIGH (ref 18–72)
Vitamin D2 1, 25 (OH)2: 64 pg/mL
Vitamin D3 1, 25 (OH)2: 20 pg/mL

## 2016-02-13 MED FILL — CYANOCOBALAMIN 1,000 MCG/ML: 1000 | 84 days supply | Qty: 5 | Fill #0

## 2016-02-13 MED FILL — VIT D2 1.25 MG (50,000 UNIT: 1.25 MG | 84 days supply | Qty: 12 | Fill #0

## 2016-03-04 ENCOUNTER — Other Ambulatory Visit (INDEPENDENT_AMBULATORY_CARE_PROVIDER_SITE_OTHER): Payer: 59

## 2016-03-04 DIAGNOSIS — R739 Hyperglycemia, unspecified: Secondary | ICD-10-CM

## 2016-03-04 LAB — HEMOGLOBIN A1C: HEMOGLOBIN A1C: 6.5 % (ref 4.6–6.5)

## 2016-03-13 MED FILL — SYMBICORT 80-4.5 MCG INH: 80-4.5 | 30 days supply | Qty: 10 | Fill #2

## 2016-05-22 MED FILL — XOPENEX HFA 45 MCG INHALER: 45 | 16 days supply | Qty: 15 | Fill #0

## 2016-05-22 MED FILL — SYMBICORT 80-4.5 MCG INH: 80-4.5 | 30 days supply | Qty: 10 | Fill #3

## 2016-05-27 ENCOUNTER — Other Ambulatory Visit: Payer: Self-pay | Admitting: Obstetrics & Gynecology

## 2016-05-27 DIAGNOSIS — Z1231 Encounter for screening mammogram for malignant neoplasm of breast: Secondary | ICD-10-CM

## 2016-05-28 MED FILL — CYANOCOBALAMIN 1,000 MCG/ML: 1000 | 84 days supply | Qty: 7 | Fill #2

## 2016-05-28 MED FILL — VIT D2 1.25 MG (50,000 UNIT: 1.25 MG | 84 days supply | Qty: 12 | Fill #1

## 2016-05-30 ENCOUNTER — Encounter: Payer: Self-pay | Admitting: Obstetrics & Gynecology

## 2016-05-30 ENCOUNTER — Telehealth: Payer: Self-pay | Admitting: Obstetrics & Gynecology

## 2016-05-30 ENCOUNTER — Ambulatory Visit (INDEPENDENT_AMBULATORY_CARE_PROVIDER_SITE_OTHER): Payer: 59 | Admitting: Obstetrics & Gynecology

## 2016-05-30 VITALS — BP 118/72 | HR 72 | Resp 14 | Ht 61.0 in | Wt 156.2 lb

## 2016-05-30 DIAGNOSIS — N764 Abscess of vulva: Secondary | ICD-10-CM | POA: Diagnosis not present

## 2016-05-30 MED ORDER — FLUCONAZOLE 150 MG PO TABS: 150.0000 mg | ORAL_TABLET | Freq: Once | ORAL | 1 refills | Status: AC

## 2016-05-30 MED ORDER — SULFAMETHOXAZOLE-TRIMETHOPRIM 800-160 MG PO TABS: 1.0000 | ORAL_TABLET | Freq: Two times a day (BID) | ORAL | 0 refills | Status: DC

## 2016-05-30 MED FILL — MOMETASONE FUROATE 50 MCG S: 50 | 30 days supply | Qty: 17 | Fill #1 | Status: TO

## 2016-05-30 MED FILL — SULFAMETHOXAZOLE-TMP DS TAB: 800-160 | 7 days supply | Qty: 14 | Fill #0

## 2016-05-30 MED FILL — FLUCONAZOLE 150 MG TABLET: 150 | 3 days supply | Qty: 2 | Fill #0

## 2016-05-30 NOTE — Progress Notes (Signed)
GYNECOLOGY  VISIT   HPI: 45 y.o. G3P2 Married female with several day hx of increasing vulvar pain and tenderness on right labia.  Reports area is larger and more tender today and she desired to be seen before the weekend.  She has tried to "mash" the area and can get a little drainage.  Has been using hot compressed without much success.  Denies fevers.  Denies vaginal discharge.  No new sexual partners.  GYNECOLOGIC HISTORY: Patient's last menstrual period was 04/04/2012. Contraception: hysterectomy  Patient Active Problem List   Diagnosis Date Noted  . Hyperglycemia 11/29/2015  . Vitamin D deficiency 07/25/2015  . Vitamin B 12 deficiency 07/25/2015  . Perennial allergic rhinitis 09/26/2014  . Chronic sore throat 07/14/2014  . IBS (irritable bowel syndrome) 05/10/2014  . Unspecified asthma, with exacerbation 07/30/2013  . Other and unspecified hyperlipidemia 12/15/2012  . Chest pain, midsternal 12/14/2012  . Anemia 05/04/2012  . Overactive bladder 05/04/2012  . Asthma, moderate persistent 01/14/2012    Past Medical History:  Diagnosis Date  . Allergy   . Anemia   . Arthritis    hands  . Asthma    ?, inhaler use as needed  . Complication of anesthesia 1990   muscle relaxant caused lung to collapse  . Difficult intubation 1990  . Dyslipidemia   . History of chicken pox   . IBS (irritable bowel syndrome)    ?  [name] Kitchen Migraine   . UTI (lower urinary tract infection)     Past Surgical History:  Procedure Laterality Date  . ABDOMINOPLASTY  05/04/2012   Procedure: ABDOMINOPLASTY;  Surgeon: Crissie Reese, MD;  Location: [facility];  Service: Plastics;  Laterality: N/A;  . ANAL FISSURE REPAIR     age 41  . EYE SURGERY Left    age 74  . GYNECOLOGIC CRYOSURGERY    . WISDOM TOOTH EXTRACTION      MEDS:  Reviewed in EPIC and UTD  ALLERGIES: Scopolamine; Biaxin [clarithromycin]; Tamiflu [oseltamivir phosphate]; Cephalexin; and Asa [aspirin]  Family History  Problem Relation Age  of Onset  . Arthritis Mother   . Hyperlipidemia Mother   . Hyperlipidemia Father   . Heart disease Father 25    CAD  . Thyroid disease Sister   . Hyperlipidemia Brother   . Glaucoma Mother   . Ulcerative colitis Brother   . Irritable bowel syndrome Mother   . Cataracts Father   . Cataracts Mother    SH:  Married, non smoker  Review of Systems  Genitourinary:       Vulvar pain and swelling  All other systems reviewed and are negative.   PHYSICAL EXAMINATION:    BP 118/72 (BP Location: Right Arm, Patient Position: Sitting, Cuff Size: Normal)   Pulse 72   Resp 14   Ht 5' 1"  (1.549 m)   Wt 156 lb 3.2 oz (70.9 kg)   LMP 04/04/2012   BMI 29.51 kg/m     Physical Exam  Constitutional: She is oriented to person, place, and time. She appears well-developed and well-nourished.  Genitourinary: Vagina normal.  There is tenderness and lesion on the right labia. There is no rash, injury or Bartholin's cyst on the right labia. There is no rash, tenderness, lesion, injury or Bartholin's cyst on the left labia.     Lymphadenopathy:       Right: No inguinal adenopathy present.       Left: No inguinal adenopathy present.  No inguinal LAD  Neurological: She is alert  and oriented to person, place, and time.  Skin: Skin is warm and dry.  Psychiatric: She has a normal mood and affect.   Due to presence of labial abscess and fluctuance, I&D recommended.  Verbal consent obtained.  Area cleansed with Betadine x 3.  Using sterile technique, area anesthetized with 1% Lidocaine.  1.5 cc total used.  Lot:  [id number].  Exp:  2/21.  Using #11 blade, lesion opened.  Purulent drainage noted.  Culture obtained.  Lesion not deep enough to pack.  Minimal bleeding noted after procedure.  No dressing applied.  Pt tolerated procedure well.   Chaperone was present for exam.  Assessment: Right labium minor abscess, s/p I&D today  Plan: Due to inability to pack, Bactrim DS BID x 7 days given.  Pt knows to  call for follow up if she does not have complete resolution of lesion.

## 2016-05-30 NOTE — Telephone Encounter (Signed)
Spoke with patient. Appointment scheduled for today 05/30/2016 at 11:30 am with Dr.Miller (time per provider). Patient is agreeable to date and time.  Routing to provider for final review. Patient agreeable to disposition. Will close encounter.

## 2016-05-30 NOTE — Patient Instructions (Signed)
Wash with soap and water.  You can use neosporin.  Warm compressed three times daily will help with tenderness.  Use motrin or tylenol as needed for pain.

## 2016-05-30 NOTE — Telephone Encounter (Signed)
Spoke with patient. Patient states that at the beginning of this week she noticed a swollen area on the right side of her labia. Reports the area has grown in size and is now very uncomfortable. Reports the area is red in color. Patient squished the lump and states a small amount of white pus came out, but that this was extremely painful. Reports she is also having vaginal irritation that she feels may be a yeast infection. States she has a history of chronic yeast infections. Advised she will need to be seen in the office for further evaluation. Offered appointment for today at 3:30 pm with Dr.Miller, but patient declines. States that she needs an early morning or early afternoon appointment as she does not have someone to watch her children. Advised I will speak with Dr.Miller and return call. Patient is agreeable.

## 2016-05-30 NOTE — Telephone Encounter (Signed)
Patient has a boil in her vaginal area that is causing a lot of discomfort and possible yeast infection.

## 2016-06-02 ENCOUNTER — Telehealth: Payer: Self-pay | Admitting: Obstetrics & Gynecology

## 2016-06-02 ENCOUNTER — Encounter: Payer: Self-pay | Admitting: Obstetrics & Gynecology

## 2016-06-02 ENCOUNTER — Ambulatory Visit (INDEPENDENT_AMBULATORY_CARE_PROVIDER_SITE_OTHER): Payer: 59 | Admitting: Obstetrics & Gynecology

## 2016-06-02 VITALS — BP 118/72 | HR 80 | Temp 98.5°F | Resp 16 | Ht 61.0 in | Wt 156.0 lb

## 2016-06-02 DIAGNOSIS — R52 Pain, unspecified: Secondary | ICD-10-CM

## 2016-06-02 DIAGNOSIS — R6883 Chills (without fever): Secondary | ICD-10-CM | POA: Diagnosis not present

## 2016-06-02 DIAGNOSIS — R5082 Postprocedural fever: Secondary | ICD-10-CM | POA: Diagnosis not present

## 2016-06-02 LAB — CBC WITH DIFFERENTIAL/PLATELET
BASOS ABS: 0 {cells}/uL (ref 0–200)
Basophils Relative: 0 %
EOS ABS: 164 {cells}/uL (ref 15–500)
Eosinophils Relative: 4 %
HCT: 38.7 % (ref 35.0–45.0)
HEMOGLOBIN: 12.6 g/dL (ref 11.7–15.5)
LYMPHS ABS: 533 {cells}/uL — AB (ref 850–3900)
Lymphocytes Relative: 13 %
MCH: 27.6 pg (ref 27.0–33.0)
MCHC: 32.6 g/dL (ref 32.0–36.0)
MCV: 84.9 fL (ref 80.0–100.0)
MONO ABS: 369 {cells}/uL (ref 200–950)
MPV: 9.1 fL (ref 7.5–12.5)
Monocytes Relative: 9 %
NEUTROS ABS: 3034 {cells}/uL (ref 1500–7800)
Neutrophils Relative %: 74 %
Platelets: 352 10*3/uL (ref 140–400)
RBC: 4.56 MIL/uL (ref 3.80–5.10)
RDW: 14.3 % (ref 11.0–15.0)
WBC: 4.1 10*3/uL (ref 3.8–10.8)

## 2016-06-02 LAB — WOUND CULTURE
GRAM STAIN: NONE SEEN
GRAM STAIN: NONE SEEN
Gram Stain: NONE SEEN

## 2016-06-02 MED ORDER — CIPROFLOXACIN HCL 500 MG PO TABS: 500.0000 mg | ORAL_TABLET | Freq: Two times a day (BID) | ORAL | 0 refills | Status: DC

## 2016-06-02 MED ORDER — DIAZEPAM 5 MG PO TABS: 5.0000 mg | ORAL_TABLET | Freq: Two times a day (BID) | ORAL | 0 refills | Status: DC | PRN

## 2016-06-02 MED FILL — diazePAM 5 MG TABS: 5 | 10 days supply | Qty: 20 | Fill #0

## 2016-06-02 MED FILL — CIPROFLOXACIN HCL 500 MG TA: 500 | 4 days supply | Qty: 8 | Fill #0

## 2016-06-02 NOTE — Telephone Encounter (Signed)
Patient's pharmacy called requesting clarification about this patient's Cipro prescription and the dosing directions.

## 2016-06-02 NOTE — Progress Notes (Signed)
GYNECOLOGY  VISIT   HPI: 45 y.o. G3P2 Married [place] female with complaint of increased temperature, chills, joint pain and body aches as well as headache for last 24-36 hours.  Had vulvar abscess I&D'ed on Friday (10/27).  Feels lesion is less tender and smaller in size.  Denies drainage.  Not sure if related to the procedure so husband texted me this morning.  As she did have procedure on Friday, decided should re-look to make sure this is not contributing.  Pt has not taken an antibiotic this morning as she found out from her mother that her mother is allergic to bactrim as well.  She reports she used "an old expired valium" to see if this would help her sleep last night.  Requesting a small RF of this.  Culture has resulted morning showing e coli.  GYNECOLOGIC HISTORY: Patient's last menstrual period was 04/04/2012. Contraception: hysterectomy Menopausal hormone therapy: none  Patient Active Problem List   Diagnosis Date Noted  . Hyperglycemia 11/29/2015  . Vitamin D deficiency 07/25/2015  . Vitamin B 12 deficiency 07/25/2015  . Perennial allergic rhinitis 09/26/2014  . Chronic sore throat 07/14/2014  . IBS (irritable bowel syndrome) 05/10/2014  . Unspecified asthma, with exacerbation 07/30/2013  . Other and unspecified hyperlipidemia 12/15/2012  . Chest pain, midsternal 12/14/2012  . Anemia 05/04/2012  . Overactive bladder 05/04/2012  . Asthma, moderate persistent 01/14/2012    Past Medical History:  Diagnosis Date  . Allergy   . Anemia   . Arthritis    hands  . Asthma    ?, inhaler use as needed  . Complication of anesthesia 1990   muscle relaxant caused lung to collapse  . Difficult intubation 1990  . Dyslipidemia   . History of chicken pox   . IBS (irritable bowel syndrome)    ?  [name] Kitchen Migraine   . UTI (lower urinary tract infection)     Past Surgical History:  Procedure Laterality Date  . ABDOMINOPLASTY  05/04/2012   Procedure: ABDOMINOPLASTY;  Surgeon: Crissie Reese, MD;  Location: [facility];  Service: Plastics;  Laterality: N/A;  . ANAL FISSURE REPAIR     age 35  . EYE SURGERY Left    age 70  . GYNECOLOGIC CRYOSURGERY    . WISDOM TOOTH EXTRACTION      MEDS:  Reviewed in EPIC and UTD  ALLERGIES: Scopolamine; Biaxin [clarithromycin]; Tamiflu [oseltamivir phosphate]; Cephalexin; and Asa [aspirin]  Family History  Problem Relation Age of Onset  . Arthritis Mother   . Hyperlipidemia Mother   . Glaucoma Mother   . Irritable bowel syndrome Mother   . Cataracts Mother   . Hyperlipidemia Father   . Heart disease Father 66    CAD  . Cataracts Father   . Thyroid disease Sister   . Hyperlipidemia Brother   . Ulcerative colitis Brother     SH:  Married, non smoker  Review of Systems  Constitutional: Positive for chills, fever and malaise/fatigue.       Body aches/pains    PHYSICAL EXAMINATION:    BP 118/72 (BP Location: Right Arm, Patient Position: Sitting, Cuff Size: Normal)   Pulse 80   Temp 98.5 F (36.9 C) (Oral)   Resp 16   Ht 5' 1"  (1.549 m)   Wt 156 lb (70.8 kg)   LMP 04/04/2012   BMI 29.48 kg/m     General appearance: alert, cooperative and appears stated age Neck: no adenopathy, supple, symmetrical, trachea midline and thyroid normal to  inspection and palpation CV:  Regular rate and rhythm Lungs:  clear to auscultation, no wheezes, rales or rhonchi, symmetric air entry Abdomen: soft, non-tender; bowel sounds normal; no masses,  no organomegaly  Pelvic: External genitalia:  Area on right labia minor where I&D was performed is much smaller with minimal surrounding erythema.  Pea sized firmness still noted.  No fluctuance              Urethra:  normal appearing urethra with no masses, tenderness or lesions              Bartholins and Skenes: normal                 Vagina: normal appearing vagina with normal color and discharge, no lesions               Chaperone was present for exam.  Assessment: S/p labia minora  vulvar abscess I&D on 10/27.  E. Coli positive skin culture. Possible Bactrim reaction with fever/chills, join pains vs virus.  No concern for sepsis.  Plan: CBC with diff Change to Cipro 520m bid x 4 days to complete 7 days of therapy.  She has stopped the bactrim.  Drug allergy added in EPIC. Valium 539mqhs prn.  #20/0RF. Will follow up with pt via phone after CBC results.  May need additional follow up pending results.

## 2016-06-02 NOTE — Telephone Encounter (Signed)
Returned call to pharmacy, line busy.       Plan: CBC with diff Change to Cipro 563m bid x 4 days to complete 7 days of therapy.  She has stopped the bactrim.  Drug allergy added in EPIC. Valium 548mqhs prn.  #20/0RF. Will follow up with pt via phone after CBC results.  May need additional follow up pending results.

## 2016-06-02 NOTE — Telephone Encounter (Signed)
Spoke with [place] at [facility]. Pharmacy calling to clarify number of Cipro tablets to dispense; 6 tabs ordered, 8 needed to requested order, BID x 4 days. Ok to fill #8.     Plan: CBC with diff Change to Cipro 539m bid x 4 days to complete 7 days of therapy.  She has stopped the bactrim.  Drug allergy added in EPIC. Valium 559mqhs prn.  #20/0RF. Will follow up with pt via phone after CBC results.  May need additional follow up pending results.  Dr. MiSabra Heckdo you agree with recommendations?

## 2016-06-02 NOTE — Telephone Encounter (Signed)
Spoke with patient. Advised as seen below per Dr. Sabra Heck. Patient verbalizes understanding and is agreeable. Patient thankful for Dr. Ammie Ferrier time.   Routing to provider for final review. Patient is agreeable to disposition. Will close encounter.

## 2016-06-02 NOTE — Telephone Encounter (Signed)
Marissa Horn at [facility] returning call. Patient states she was told to complete 7 day course of Cipro and states she has no other prescription for Cipro. Advised I will review with Dr. Sabra Heck and return call.  Dr. Sabra Heck, please advise?

## 2016-06-02 NOTE — Progress Notes (Signed)
Prescription for Valium faxed to [facility] as requested. [phone]

## 2016-06-02 NOTE — Telephone Encounter (Signed)
Spoke with Dr. Sabra Heck, clarified Cipro order. Patient only to take 4 days Cipro. Patient had taken 3 days of bactrim. Total antibiotic treatment to total 7 days.   Marissa Horn at [facility], advised of Dr. Ammie Ferrier recommendations. RN to notify patient.

## 2016-06-09 ENCOUNTER — Telehealth: Payer: Self-pay | Admitting: Obstetrics & Gynecology

## 2016-06-09 NOTE — Telephone Encounter (Signed)
patient states she is returning a nurse call from Friday but I don't see anything in the chart.

## 2016-06-09 NOTE — Telephone Encounter (Signed)
Left message to call Sharee Pimple at [phone].  Schedule 2wk f/u  Notes Recorded by Matthew Folks, RN on 06/06/2016 at 1:22 PM EDT Patient notified it could take up to two weeks for area to resolve. Patient verbalized understanding. Patient driving at this time and wants to call back to schedule recheck. ------  Notes Recorded by Matthew Folks, RN on 06/05/2016 at 11:05 AM EDT Call to patient. Reviewed CBC results with her and she verbalized understanding. Patient states she it "feeling a lot better- it was 100% the medicine she was on." Patient states she still feels weak at times and will occasionally breakout in sweats, but overall she is feeling better. Patient asking how long it will take "the bubble to resolve?" She said it hasn't subsided yet and is still irritating. ------  Notes Recorded by Megan Salon, MD on 06/03/2016 at 3:48 PM EDT Please let pt know CBC was normal. Can you get update on her please?

## 2016-06-09 NOTE — Telephone Encounter (Signed)
Patient scheduled for appt 06/25/16 @ 8:30am with Dr Sabra Heck

## 2016-06-18 ENCOUNTER — Ambulatory Visit
Admission: RE | Admit: 2016-06-18 | Discharge: 2016-06-18 | Disposition: A | Discharge status: Home / Self Care | Payer: 59 | Source: Ambulatory Visit | Attending: Obstetrics & Gynecology | Admitting: Obstetrics & Gynecology

## 2016-06-18 DIAGNOSIS — Z1231 Encounter for screening mammogram for malignant neoplasm of breast: Secondary | ICD-10-CM | POA: Diagnosis not present

## 2016-06-25 ENCOUNTER — Ambulatory Visit (INDEPENDENT_AMBULATORY_CARE_PROVIDER_SITE_OTHER): Payer: 59 | Admitting: Obstetrics & Gynecology

## 2016-06-25 VITALS — BP 128/80 | HR 88 | Resp 16 | Wt 154.0 lb

## 2016-06-25 DIAGNOSIS — N764 Abscess of vulva: Secondary | ICD-10-CM

## 2016-06-27 ENCOUNTER — Encounter: Payer: Self-pay | Admitting: Obstetrics & Gynecology

## 2016-06-27 NOTE — Progress Notes (Signed)
GYNECOLOGY  VISIT   HPI: 45 y.o. G3P2 Married [place] female here for follow-up after having labia minora abscess that required I&D.  Pt is doing well.  No pain.  No discharge.  Area is fully resolved.  No current issues.  GYNECOLOGIC HISTORY: Patient's last menstrual period was 04/04/2012. Contraception: Hysterectomy  Patient Active Problem List   Diagnosis Date Noted  . Hyperglycemia 11/29/2015  . Vitamin D deficiency 07/25/2015  . Vitamin B 12 deficiency 07/25/2015  . Perennial allergic rhinitis 09/26/2014  . Chronic sore throat 07/14/2014  . IBS (irritable bowel syndrome) 05/10/2014  . Unspecified asthma, with exacerbation 07/30/2013  . Other and unspecified hyperlipidemia 12/15/2012  . Chest pain, midsternal 12/14/2012  . Anemia 05/04/2012  . Overactive bladder 05/04/2012  . Asthma, moderate persistent 01/14/2012    Past Medical History:  Diagnosis Date  . Allergy   . Anemia   . Arthritis    hands  . Asthma    ?, inhaler use as needed  . Complication of anesthesia 1990   muscle relaxant caused lung to collapse  . Difficult intubation 1990  . Dyslipidemia   . History of chicken pox   . IBS (irritable bowel syndrome)    ?  [name] Kitchen Migraine   . UTI (lower urinary tract infection)     Past Surgical History:  Procedure Laterality Date  . ABDOMINOPLASTY  05/04/2012   Procedure: ABDOMINOPLASTY;  Surgeon: Crissie Reese, MD;  Location: [facility];  Service: Plastics;  Laterality: N/A;  . ANAL FISSURE REPAIR     age 69  . EYE SURGERY Left    age 41  . GYNECOLOGIC CRYOSURGERY    . WISDOM TOOTH EXTRACTION      MEDS:  Reviewed in EPIC and UTD  ALLERGIES: Scopolamine; Bactrim [sulfamethoxazole-trimethoprim]; Biaxin [clarithromycin]; Tamiflu [oseltamivir phosphate]; Cephalexin; and Asa [aspirin]  Family History  Problem Relation Age of Onset  . Arthritis Mother   . Hyperlipidemia Mother   . Glaucoma Mother   . Irritable bowel syndrome Mother   . Cataracts Mother   .  Hyperlipidemia Father   . Heart disease Father 72    CAD  . Cataracts Father   . Thyroid disease Sister   . Hyperlipidemia Brother   . Ulcerative colitis Brother     SH:  Married, non smoker  Review of Systems  All other systems reviewed and are negative.   PHYSICAL EXAMINATION:    BP 128/80 (BP Location: Right Arm, Patient Position: Sitting, Cuff Size: Normal)   Pulse 88   Resp 16   Wt 154 lb (69.9 kg)   LMP 04/04/2012   BMI 29.10 kg/m     General appearance: alert, cooperative and appears stated age  Pelvic: External genitalia:  No lesions, in particular, the area of the right labia minora where the abscess was present is fully resolved without any findings other than I can see the scar where the I&D was performed.  No skin thickening noted.              Urethra:  normal appearing urethra with no masses, tenderness or lesions              Bartholins and Skenes: normal                 Anus:  no lesions  Assessment: S/p I&D of vulvar abscess of labia minora, fully resolved.  Plan: D/W pt possibly changing to a more antibacterial soap as well as changing washcloths frequently to help prevent  recurrence.  All questions answered.

## 2016-07-08 DIAGNOSIS — H93293 Other abnormal auditory perceptions, bilateral: Secondary | ICD-10-CM | POA: Diagnosis not present

## 2016-07-08 DIAGNOSIS — R196 Halitosis: Secondary | ICD-10-CM | POA: Diagnosis not present

## 2016-07-22 MED FILL — SYMBICORT 80-4.5 MCG INH: 80-4.5 | 30 days supply | Qty: 10 | Fill #4

## 2016-07-24 ENCOUNTER — Telehealth: Payer: Self-pay | Admitting: Family Medicine

## 2016-07-24 NOTE — Telephone Encounter (Signed)
Pt need new Rx for mometasone and ELOCON   Ph[facility]  Pt state that she needs the Rx today.  Pt aware of the 3 business days for refills and that Dr. Elease Hashimoto is not in the office.

## 2016-07-25 MED ORDER — MOMETASONE FUROATE 0.1 % EX CREA: 1.0000 "application " | TOPICAL_CREAM | Freq: Every day | CUTANEOUS | 2 refills | Status: DC

## 2016-07-25 NOTE — Telephone Encounter (Signed)
Medication sent in for patient. 

## 2016-07-29 ENCOUNTER — Other Ambulatory Visit: Payer: 59

## 2016-08-06 ENCOUNTER — Other Ambulatory Visit (INDEPENDENT_AMBULATORY_CARE_PROVIDER_SITE_OTHER): Payer: 59

## 2016-08-06 DIAGNOSIS — R7989 Other specified abnormal findings of blood chemistry: Secondary | ICD-10-CM

## 2016-08-06 DIAGNOSIS — Z0000 Encounter for general adult medical examination without abnormal findings: Secondary | ICD-10-CM | POA: Diagnosis not present

## 2016-08-06 DIAGNOSIS — E559 Vitamin D deficiency, unspecified: Secondary | ICD-10-CM

## 2016-08-06 DIAGNOSIS — E538 Deficiency of other specified B group vitamins: Secondary | ICD-10-CM

## 2016-08-06 LAB — HEPATIC FUNCTION PANEL
ALBUMIN: 4.4 g/dL (ref 3.5–5.2)
ALK PHOS: 51 U/L (ref 39–117)
ALT: 21 U/L (ref 0–35)
AST: 16 U/L (ref 0–37)
BILIRUBIN DIRECT: 0.1 mg/dL (ref 0.0–0.3)
Total Bilirubin: 1.1 mg/dL (ref 0.2–1.2)
Total Protein: 6.7 g/dL (ref 6.0–8.3)

## 2016-08-06 LAB — BASIC METABOLIC PANEL
BUN: 6 mg/dL (ref 6–23)
CALCIUM: 9.4 mg/dL (ref 8.4–10.5)
CO2: 29 meq/L (ref 19–32)
CREATININE: 0.66 mg/dL (ref 0.40–1.20)
Chloride: 99 mEq/L (ref 96–112)
GFR: 103.13 mL/min (ref >60.00)
GLUCOSE: 99 mg/dL (ref 70–99)
Potassium: 4.2 mEq/L (ref 3.5–5.1)
Sodium: 136 mEq/L (ref 135–145)

## 2016-08-06 LAB — LDL CHOLESTEROL, DIRECT: Direct LDL: 158 mg/dL

## 2016-08-06 LAB — LIPID PANEL
Cholesterol: 261 mg/dL — ABNORMAL HIGH (ref 0–200)
HDL: 46.7 mg/dL (ref >39.00)
NonHDL: 214.51
TRIGLYCERIDES: 346 mg/dL — AB (ref 0.0–149.0)
Total CHOL/HDL Ratio: 6
VLDL: 69.2 mg/dL — AB (ref 0.0–40.0)

## 2016-08-06 LAB — CBC WITH DIFFERENTIAL/PLATELET
BASOS ABS: 0 10*3/uL (ref 0.0–0.1)
Basophils Relative: 0.6 % (ref 0.0–3.0)
EOS ABS: 0.1 10*3/uL (ref 0.0–0.7)
Eosinophils Relative: 1.8 % (ref 0.0–5.0)
HCT: 36.4 % (ref 36.0–46.0)
Hemoglobin: 12 g/dL (ref 12.0–15.0)
LYMPHS ABS: 2.5 10*3/uL (ref 0.7–4.0)
Lymphocytes Relative: 30 % (ref 12.0–46.0)
MCHC: 32.9 g/dL (ref 30.0–36.0)
MCV: 84.3 fl (ref 78.0–100.0)
Monocytes Absolute: 0.4 10*3/uL (ref 0.1–1.0)
Monocytes Relative: 5.1 % (ref 3.0–12.0)
NEUTROS PCT: 62.5 % (ref 43.0–77.0)
Neutro Abs: 5.2 10*3/uL (ref 1.4–7.7)
PLATELETS: 326 10*3/uL (ref 150.0–400.0)
RBC: 4.32 Mil/uL (ref 3.87–5.11)
RDW: 14.9 % (ref 11.5–15.5)
WBC: 8.3 10*3/uL (ref 4.0–10.5)

## 2016-08-06 LAB — TSH: TSH: 2.88 u[IU]/mL (ref 0.35–4.50)

## 2016-08-06 LAB — VITAMIN B12: Vitamin B-12: 277 pg/mL (ref 211–911)

## 2016-08-06 LAB — VITAMIN D 25 HYDROXY (VIT D DEFICIENCY, FRACTURES): VITD: 20.84 ng/mL — ABNORMAL LOW (ref 30.00–100.00)

## 2016-08-08 ENCOUNTER — Ambulatory Visit (INDEPENDENT_AMBULATORY_CARE_PROVIDER_SITE_OTHER): Payer: 59 | Admitting: Family Medicine

## 2016-08-08 VITALS — BP 120/88 | HR 84 | Temp 97.7°F | Ht 61.0 in | Wt 156.6 lb

## 2016-08-08 DIAGNOSIS — Z7189 Other specified counseling: Secondary | ICD-10-CM | POA: Diagnosis not present

## 2016-08-08 DIAGNOSIS — R739 Hyperglycemia, unspecified: Secondary | ICD-10-CM

## 2016-08-08 DIAGNOSIS — Z0000 Encounter for general adult medical examination without abnormal findings: Secondary | ICD-10-CM | POA: Diagnosis not present

## 2016-08-08 DIAGNOSIS — Z7184 Encounter for health counseling related to travel: Secondary | ICD-10-CM

## 2016-08-08 LAB — POCT GLYCOSYLATED HEMOGLOBIN (HGB A1C): Hemoglobin A1C: 6.8

## 2016-08-08 MED ORDER — MOMETASONE FUROATE 50 MCG/ACT NA SUSP: 2.0000 | Freq: Every day | NASAL | 12 refills | Status: DC

## 2016-08-08 MED ORDER — ERGOCALCIFEROL 1.25 MG (50000 UT) PO CAPS: 50000.0000 [IU] | ORAL_CAPSULE | ORAL | 3 refills | Status: DC

## 2016-08-08 MED ORDER — MOMETASONE FUROATE 0.1 % EX CREA: 1.0000 "application " | TOPICAL_CREAM | Freq: Every day | CUTANEOUS | 2 refills | Status: DC

## 2016-08-08 MED FILL — MOMETASONE FUROATE 0.1% CRM: 0.1 | 14 days supply | Qty: 45 | Fill #0

## 2016-08-08 MED FILL — MOMETASONE FUROATE 50 MCG S: 50 | 30 days supply | Qty: 17 | Fill #0

## 2016-08-08 MED FILL — VIT D2 1.25 MG (50,000 UNIT: 1.25 MG | 84 days supply | Qty: 12 | Fill #0

## 2016-08-08 NOTE — Patient Instructions (Addendum)
Food Choices to Lower Your Triglycerides Triglycerides are a type of fat in your blood. High levels of triglycerides can increase the risk of heart disease and stroke. If your triglyceride levels are high, the foods you eat and your eating habits are very important. Choosing the right foods can help lower your triglycerides. What general guidelines do I need to follow?  Lose weight if you are overweight.  Limit or avoid alcohol.  Fill one half of your plate with vegetables and green salads.  Limit fruit to two servings a day. Choose fruit instead of juice.  Make one fourth of your plate whole grains. Look for the word "whole" as the first word in the ingredient list.  Fill one fourth of your plate with lean protein foods.  Enjoy fatty fish (such as salmon, mackerel, sardines, and tuna) three times a week.  Choose healthy fats.  Limit foods high in starch and sugar.  Eat more home-cooked food and less restaurant, buffet, and fast food.  Limit fried foods.  Cook foods using methods other than frying.  Limit saturated fats.  Check ingredient lists to avoid foods with partially hydrogenated oils (trans fats) in them. What foods can I eat? Grains  Whole grains, such as whole wheat or whole grain breads, crackers, cereals, and pasta. Unsweetened oatmeal, bulgur, barley, quinoa, or brown rice. Corn or whole wheat flour tortillas. Vegetables  Fresh or frozen vegetables (raw, steamed, roasted, or grilled). Green salads. Fruits  All fresh, canned (in natural juice), or frozen fruits. Meat and Other Protein Products  Ground beef (85% or leaner), grass-fed beef, or beef trimmed of fat. Skinless chicken or [place]. Ground chicken or [place]. Pork trimmed of fat. All fish and seafood. Eggs. Dried beans, peas, or lentils. Unsalted nuts or seeds. Unsalted canned or dry beans. Dairy  Low-fat dairy products, such as skim or 1% milk, 2% or reduced-fat cheeses, low-fat ricotta or cottage cheese,  or plain low-fat yogurt. Fats and Oils  Tub margarines without trans fats. Light or reduced-fat mayonnaise and salad dressings. Avocado. Safflower, olive, or canola oils. Natural peanut or almond butter. The items listed above may not be a complete list of recommended foods or beverages. Contact your dietitian for more options.  What foods are not recommended? Grains  White bread. White pasta. White rice. Cornbread. Bagels, pastries, and croissants. Crackers that contain trans fat. Vegetables  White potatoes. Corn. Creamed or fried vegetables. Vegetables in a cheese sauce. Fruits  Dried fruits. Canned fruit in light or heavy syrup. Fruit juice. Meat and Other Protein Products  Fatty cuts of meat. Ribs, chicken wings, bacon, sausage, bologna, salami, chitterlings, fatback, hot dogs, bratwurst, and packaged luncheon meats. Dairy  Whole or 2% milk, cream, half-and-half, and cream cheese. Whole-fat or sweetened yogurt. Full-fat cheeses. Nondairy creamers and whipped toppings. Processed cheese, cheese spreads, or cheese curds. Sweets and Desserts  Corn syrup, sugars, honey, and molasses. Candy. Jam and jelly. Syrup. Sweetened cereals. Cookies, pies, cakes, donuts, muffins, and ice cream. Fats and Oils  Butter, stick margarine, lard, shortening, ghee, or bacon fat. Coconut, palm kernel, or palm oils. Beverages  Alcohol. Sweetened drinks (such as sodas, lemonade, and fruit drinks or punches). The items listed above may not be a complete list of foods and beverages to avoid. Contact your dietitian for more information.  This information is not intended to replace advice given to you by your health care provider. Make sure you discuss any questions you have with your health care provider. Document  Released: 05/08/2004 Document Revised: 12/27/2015 Document Reviewed: 05/25/2013 Elsevier Interactive Patient Education  2017 [place] more consistent exercise, as discussed -Reduce  sugars and white starches -Continue with monthly B12 injections -let's plan on follow up in 3 months.

## 2016-08-08 NOTE — Progress Notes (Signed)
Pre visit review using our clinic review tool, if applicable. No additional management support is needed unless otherwise documented below in the visit note. 

## 2016-08-08 NOTE — Progress Notes (Signed)
Subjective:     Patient ID: Marissa Horn, female   DOB: [birth year], 46 y.o.   MRN: [id number]  HPI Patient here for physical. She sees gynecologist regularly. She's had previous hysterectomy. Chronic problems include history of prediabetes, vitamin D deficiency, perennial allergies, IBS, asthma, dyslipidemia, metabolic syndrome. No consistent exercise. Not monitoring blood pressures or blood sugars regularly. Somewhat inconsistent use of vitamin D recently. She had A1c of 6.5% back in August. Father had history of CAD.  Past Medical History:  Diagnosis Date  . Allergy   . Anemia   . Arthritis    hands  . Asthma    ?, inhaler use as needed  . Complication of anesthesia 1990   muscle relaxant caused lung to collapse  . Difficult intubation 1990  . Dyslipidemia   . History of chicken pox   . IBS (irritable bowel syndrome)    ?  [name] Kitchen Migraine   . UTI (lower urinary tract infection)    Past Surgical History:  Procedure Laterality Date  . ABDOMINOPLASTY  05/04/2012   Procedure: ABDOMINOPLASTY;  Surgeon: Crissie Reese, MD;  Location: [facility];  Service: Plastics;  Laterality: N/A;  . ANAL FISSURE REPAIR     age 23  . EYE SURGERY Left    age 28  . GYNECOLOGIC CRYOSURGERY    . WISDOM TOOTH EXTRACTION      reports that she has never smoked. She has never used smokeless tobacco. She reports that she does not drink alcohol or use drugs. family history includes Arthritis in her mother; Cataracts in her father and mother; Glaucoma in her mother; Heart disease (age of onset: 5) in her father; Hyperlipidemia in her brother, father, and mother; Irritable bowel syndrome in her mother; Thyroid disease in her sister; Ulcerative colitis in her brother. Allergies  Allergen Reactions  . Scopolamine Anaphylaxis  . Bactrim [Sulfamethoxazole-Trimethoprim]     Joint pain, fever  . Biaxin [Clarithromycin] Nausea And Vomiting    Projectile vomiting  . Tamiflu [Oseltamivir Phosphate] Nausea And Vomiting   Projectile vomiting  . Cephalexin Nausea And Vomiting  . Asa [Aspirin] Rash    Pt takes ibuprofen without problems     Review of Systems  Constitutional: Negative for activity change, appetite change, fatigue, fever and unexpected weight change.  HENT: Negative for ear pain, hearing loss, sore throat and trouble swallowing.   Eyes: Negative for visual disturbance.  Respiratory: Negative for cough and shortness of breath.   Cardiovascular: Negative for chest pain and palpitations.  Gastrointestinal: Negative for abdominal pain, blood in stool, constipation and diarrhea.  Genitourinary: Negative for dysuria and hematuria.  Musculoskeletal: Negative for arthralgias, back pain and myalgias.  Skin: Negative for rash.  Neurological: Negative for dizziness, syncope and headaches.  Hematological: Negative for adenopathy.  Psychiatric/Behavioral: Negative for confusion and dysphoric mood.       Objective:   Physical Exam  Constitutional: She is oriented to person, place, and time. She appears well-developed and well-nourished.  HENT:  Head: Normocephalic and atraumatic.  Mouth/Throat: Oropharynx is clear and moist.  Eyes: EOM are normal. Pupils are equal, round, and reactive to light.  Neck: Normal range of motion. Neck supple. No thyromegaly present.  Cardiovascular: Normal rate, regular rhythm and normal heart sounds.   No murmur heard. Pulmonary/Chest: Breath sounds normal. No respiratory distress. She has no wheezes. She has no rales.  Abdominal: Soft. Bowel sounds are normal. She exhibits no distension and no mass. There is no tenderness. There is no rebound  and no guarding.  Genitourinary:  Genitourinary Comments: Per gyn  Musculoskeletal: Normal range of motion. She exhibits no edema.  Lymphadenopathy:    She has no cervical adenopathy.  Neurological: She is alert and oriented to person, place, and time. She displays normal reflexes. No cranial nerve deficit.  Skin: No rash  noted.  Psychiatric: She has a normal mood and affect. Her behavior is normal. Judgment and thought content normal.       Assessment:     Physical exam. Patient has borderline elevated blood pressure. This improved significantly after rest. She has dyslipidemia. 10 year risk of CAD event 1.37%. Vitamin D level remains low. She has history of B12 deficiency with risk factor being that she is a vegetarian    Plan:     -Continue with monthly B12 injections (she does these at home) -Refill vitamin D -Check A1c today -Encourage more consistent exercise  Eulas Post MD [facility] at [facility]

## 2016-08-10 ENCOUNTER — Encounter: Payer: Self-pay | Admitting: Family Medicine

## 2016-08-10 MED ORDER — LEVOCETIRIZINE DIHYDROCHLORIDE 5 MG PO TABS: 5.0000 mg | ORAL_TABLET | Freq: Every day | ORAL | 3 refills | Status: DC | PRN

## 2016-08-10 MED ORDER — ATOVAQUONE-PROGUANIL HCL 250-100 MG PO TABS: ORAL_TABLET | ORAL | 0 refills | Status: DC

## 2016-08-10 MED ORDER — CYANOCOBALAMIN 1000 MCG/ML IJ SOLN: INTRAMUSCULAR | 2 refills | Status: DC

## 2016-08-10 NOTE — Addendum Note (Signed)
Addended by: Eulas Post on: 08/10/2016 02:24 PM   Modules accepted: Orders

## 2016-08-11 ENCOUNTER — Other Ambulatory Visit: Payer: Self-pay

## 2016-08-11 MED ORDER — CIPROFLOXACIN HCL 500 MG PO TABS: ORAL_TABLET | ORAL | 0 refills | Status: DC

## 2016-08-11 MED FILL — ATOVAQUONE-PROGUANIL 250-10: 250-100 | 16 days supply | Qty: 16 | Fill #0

## 2016-08-11 MED FILL — CYANOCOBALAMIN 1,000 MCG/ML: 1000 | 90 days supply | Qty: 5 | Fill #0

## 2016-08-11 MED FILL — CIPROFLOXACIN HCL 500 MG TA: 500 | 15 days supply | Qty: 30 | Fill #0

## 2016-08-11 MED FILL — LEVOCETIRIZINE 5 MG TABLET: 5 | 90 days supply | Qty: 90 | Fill #0

## 2016-09-19 DIAGNOSIS — H5213 Myopia, bilateral: Secondary | ICD-10-CM | POA: Diagnosis not present

## 2016-09-19 DIAGNOSIS — H5010 Unspecified exotropia: Secondary | ICD-10-CM | POA: Diagnosis not present

## 2016-09-19 DIAGNOSIS — H524 Presbyopia: Secondary | ICD-10-CM | POA: Diagnosis not present

## 2016-10-16 ENCOUNTER — Other Ambulatory Visit: Payer: Self-pay | Admitting: Family Medicine

## 2016-10-17 MED FILL — SYMBICORT 80-4.5 MCG INH: 80-4.5 | 30 days supply | Qty: 10 | Fill #0

## 2016-10-24 ENCOUNTER — Ambulatory Visit (INDEPENDENT_AMBULATORY_CARE_PROVIDER_SITE_OTHER): Payer: 59 | Admitting: Internal Medicine

## 2016-10-24 ENCOUNTER — Encounter: Payer: Self-pay | Admitting: Internal Medicine

## 2016-10-24 VITALS — BP 140/100 | HR 81 | Temp 98.2°F | Ht 61.0 in | Wt 157.4 lb

## 2016-10-24 DIAGNOSIS — J4521 Mild intermittent asthma with (acute) exacerbation: Secondary | ICD-10-CM | POA: Diagnosis not present

## 2016-10-24 DIAGNOSIS — K219 Gastro-esophageal reflux disease without esophagitis: Secondary | ICD-10-CM | POA: Diagnosis not present

## 2016-10-24 DIAGNOSIS — R0789 Other chest pain: Secondary | ICD-10-CM

## 2016-10-24 MED ORDER — RANITIDINE HCL 75 MG PO TABS: 150.0000 mg | ORAL_TABLET | Freq: Two times a day (BID) | ORAL | 0 refills | Status: DC

## 2016-10-24 MED ORDER — LEVALBUTEROL TARTRATE 45 MCG/ACT IN AERO: 1.0000 | INHALATION_SPRAY | Freq: Four times a day (QID) | RESPIRATORY_TRACT | 0 refills | Status: DC | PRN

## 2016-10-24 MED ORDER — LEVALBUTEROL HCL 1.25 MG/3ML IN NEBU: 1.2500 mg | INHALATION_SOLUTION | RESPIRATORY_TRACT | 0 refills | Status: DC | PRN

## 2016-10-24 MED ORDER — METHYLPREDNISOLONE ACETATE 80 MG/ML IJ SUSP
120.0000 mg | Freq: Once | INTRAMUSCULAR | Status: AC
  Administered 2016-10-24: 120 mg via INTRAMUSCULAR

## 2016-10-24 MED FILL — LEVALBUTEROL 1.25 MG/3 ML S: 1.25 | 4 days supply | Qty: 72 | Fill #0

## 2016-10-24 MED FILL — LEVALBUTEROL TAR HFA 45MCG: 45 | 25 days supply | Qty: 15 | Fill #0

## 2016-10-24 NOTE — Progress Notes (Signed)
Chief Complaint  Patient presents with  . Asthma    tried symbicort and xopenex /hurts to breath     HPI: Marissa Horn 46 y.o.  sda  PCP NA She has a history of asthma that had been well controlled on seasonal use of Symbicort. However yesterday had some mid chest symptoms and shortness of breath last evening without history of upper respiratory infection fever or predated cough. She states it was tightening felt like her asthma took Symbicort without relief nor her Xopenex HFA. She finally did a Xopenex nebulizer which did help her go to sleep. States she usually gets a shot of cortisone and Rocephin when she gets an asthma attack. Drank something and then closoing feeling hard to catch breath and since    So took symbicort x 2 didn't owrk and xopenex not helping and gets jittery side effect   Took neb  Nebulizer helped some.   Fall and winter  . No fever  And  Uri sx.  And had       Late at tnight had closing up  And thought food stuck  feeling at first. She has been getting heartburn recently and taking intermittent 75 mg of ranitidine. Apparently has some difficulty swallowing large pills. But no dysphagia otherwise. Asked for refill of her medicine discussed Burns time  Since last attack.  ROS: See pertinent positives and negatives per HPI. No fever or rhinorrhea itching sneezing hives. She was at a meeting might of been exposed to some dust. No coughing right now. Chest just feels tight.   Past Medical History:  Diagnosis Date  . Allergy   . Anemia   . Arthritis    hands  . Asthma    ?, inhaler use as needed  . Complication of anesthesia 1990   muscle relaxant caused lung to collapse  . Difficult intubation 1990  . Dyslipidemia   . History of chicken pox   . IBS (irritable bowel syndrome)    ?  [name] Kitchen Migraine   . UTI (lower urinary tract infection)     Family History  Problem Relation Age of Onset  . Arthritis Mother   . Hyperlipidemia Mother   .  Glaucoma Mother   . Irritable bowel syndrome Mother   . Cataracts Mother   . Hyperlipidemia Father   . Heart disease Father 31    CAD  . Cataracts Father   . Thyroid disease Sister   . Hyperlipidemia Brother   . Ulcerative colitis Brother     Social History   Social History  . Marital status: Married    Spouse name: N/A  . Number of children: 2  . Years of education: N/A   Occupational History  . clinical dietitian    Social History Main Topics  . Smoking status: Never Smoker  . Smokeless tobacco: Never Used  . Alcohol use No  . Drug use: No  . Sexual activity: Yes    Partners: Male    Birth control/ protection: Surgical     Comment: hysterectomy   Other Topics Concern  . None   Social History Narrative   Married, 2 sons. Husband is a pediatrician.      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 Approximately one maximum caffeinated beverages daily.    Outpatient Medications Prior to Visit  Medication Sig Dispense Refill  . atovaquone-proguanil (MALARONE) 250-100 MG TABS tablet Take one tablet daily starting 1-2 days prior to  travel, during travel, and for one week after return. 16 tablet 0  . cyanocobalamin (,VITAMIN B-12,) 1000 MCG/ML injection Take B12 1,000 mcg IM once every 2 weeks for 2 months, then once monthly. 10 mL 2  . ergocalciferol (VITAMIN D2) 50000 units capsule Take 1 capsule (50,000 Units total) by mouth once a week. 12 capsule 3  . ketotifen (ZADITOR) 0.025 % ophthalmic solution Place 1 drop into both eyes 2 (two) times daily.    [name] Kitchen levocetirizine (XYZAL) 5 MG tablet Take 1 tablet (5 mg total) by mouth daily as needed. 90 tablet 3  . mometasone (ELOCON) 0.1 % cream Apply 1 application topically daily. 45 g 2  . mometasone (NASONEX) 50 MCG/ACT nasal spray Place 2 sprays into the nose daily. 17 g 12  . NEEDLE, DISP, 25 G (BD ECLIPSE NEEDLE) 25G X 5/8" MISC Use as directed. 10 each 5  . SYMBICORT 80-4.5 MCG/ACT inhaler INHALE 2 PUFFS BY MOUTH  INTO THE LUNGS 2 TIMES DAILY. 10.2 g 5  . levalbuterol (XOPENEX HFA) 45 MCG/ACT inhaler Inhale 1-2 puffs into the lungs every 6 (six) hours as needed for wheezing. 1 Inhaler 2  . levalbuterol (XOPENEX) 1.25 MG/3ML nebulizer solution Take 1.25 mg by nebulization every 4 (four) hours as needed for wheezing. 72 mL 12  . ciprofloxacin (CIPRO) 500 MG tablet Take one tablet twice daily for 3 days as needed for travelers diarrhea. 30 tablet 0   No facility-administered medications prior to visit.      EXAM:  BP (!) 140/100 (BP Location: Right Arm, Patient Position: Sitting, Cuff Size: Normal)   Pulse 81   Temp 98.2 F (36.8 C) (Oral)   Ht 5' 1"  (1.549 m)   Wt 157 lb 6.4 oz (71.4 kg)   LMP 04/14/2012   SpO2 97%   BMI 29.74 kg/m   Body mass index is 29.74 kg/m.  GENERAL: vitals reviewed and listed above, alert, oriented, appears well hydrated and in no acute distressShe is not breathless and does not cough but looks uncomfortable. Them a bit worried. noreteraction of breathless ness  HEENT: atraumatic, conjunctiva  clear, no obvious abnormalities on inspection of external nose and ears OP : no lesion edema or exudate  Tongue midline   NECK: no obvious masses on inspection palpation  LUNGS: clear to auscultation bilaterally, no wheezes, rales or rhonchi,  Dec air movement  CV: HRRR, no clubbing cyanosis or  peripheral edema nl cap refill  MS: moves all extremities without noticeable focal  abnormality   ASSESSMENT AND PLAN:  Discussed the following assessment and plan:  Intermittent asthma with acute exacerbation, unspecified asthma severity - Plan: methylPREDNISolone acetate (DEPO-MEDROL) injection 120 mg  Gastroesophageal reflux disease, esophagitis presence not specified  Tightness in chest - w midline soreness  Patient reports that a shot of Rocephin and steroid usually gets her out of these attacks. However advised that antibiotics would not be helpful for her at this time  without signs of infection. Depo-Medrol to be given refilled her Xopenex 1 further refills through her PCP. I suspect she may have some reflux adding to some of her symptoms. To take ranitidine 150 mg twice a day and follow-up with Dr. Elease Hashimoto. Reviewed chart the Symbicort had already been sent in. -Patient advised to return or notify health care team  if symptoms worsen ,persist or new concerns arise.  Patient Instructions  Injectable steroid today. Please take acid blocker ranitidine equivalent 150 mg twice a day. We'll send in a 31  mg equivalent to the pharmacy. Follow-up appointment with Dr. Elease Hashimoto in 2 weeks. At this time I don't see evidence of bacterial infection and thus antibiotics may be more harmful than helpful. Make sure pharmacy since request for refills electronically in the future so hopefully refills will not get confused. I will send in one month worth in the short term by Dr. Elease Hashimoto will have to take over her under a new prescription.    Standley Brooking. Ganon Demasi M.D.

## 2016-10-24 NOTE — Patient Instructions (Signed)
Injectable steroid today. Please take acid blocker ranitidine equivalent 150 mg twice a day. We'll send in a 75 mg equivalent to the pharmacy. Follow-up appointment with Dr. Elease Hashimoto in 2 weeks. At this time I don't see evidence of bacterial infection and thus antibiotics may be more harmful than helpful. Make sure pharmacy since request for refills electronically in the future so hopefully refills will not get confused. I will send in one month worth in the short term by Dr. Elease Hashimoto will have to take over her under a new prescription.

## 2016-10-28 ENCOUNTER — Encounter: Payer: Self-pay | Admitting: Family Medicine

## 2016-10-28 ENCOUNTER — Ambulatory Visit (INDEPENDENT_AMBULATORY_CARE_PROVIDER_SITE_OTHER): Payer: 59 | Admitting: Family Medicine

## 2016-10-28 VITALS — BP 140/90 | HR 74 | Temp 98.4°F | Wt 156.6 lb

## 2016-10-28 DIAGNOSIS — J4540 Moderate persistent asthma, uncomplicated: Secondary | ICD-10-CM

## 2016-10-28 MED ORDER — PREDNISONE 20 MG PO TABS: ORAL_TABLET | ORAL | 0 refills | Status: DC

## 2016-10-28 MED FILL — predniSONE 20 MG TABS: 20 | 10 days supply | Qty: 20 | Fill #0

## 2016-10-28 NOTE — Progress Notes (Signed)
Pre visit review using our clinic review tool, if applicable. No additional management support is needed unless otherwise documented below in the visit note. 

## 2016-10-28 NOTE — Progress Notes (Signed)
Subjective:     Patient ID: Marissa Horn, female   DOB: May 07, 1971, 46 y.o.   MRN: [id number]  HPI Patient is her follow-up asthma. She generally states she has had about one exacerbation per year since starting Symbicort. Recent exacerbation after going out in cold air. There is some question of GERD related but she states she was already taking Zantac prior to last visit. Takes Xopenex as a rescue inhaler and Symbicort 2 puffs twice daily. She states she had a couple episodes where she took 3 puffs recently and considered taking Symbicort 3 times daily and we have strongly advised against that and reviewed potential dangers of overuse of long-acting bronchodilators (including increased of death from studies). She received Depo-Medrol 120 mg and feels much better today. Occasional cough. Nonproductive. No fever. Never smoked  Past Medical History:  Diagnosis Date  . Allergy   . Anemia   . Arthritis    hands  . Asthma    ?, inhaler use as needed  . Complication of anesthesia 1990   muscle relaxant caused lung to collapse  . Difficult intubation 1990  . Dyslipidemia   . History of chicken pox   . IBS (irritable bowel syndrome)    ?  [name] Kitchen Migraine   . UTI (lower urinary tract infection)    Past Surgical History:  Procedure Laterality Date  . ABDOMINOPLASTY  05/04/2012   Procedure: ABDOMINOPLASTY;  Surgeon: Crissie Reese, MD;  Location: [facility];  Service: Plastics;  Laterality: N/A;  . ANAL FISSURE REPAIR     age 56  . EYE SURGERY Left    age 78  . GYNECOLOGIC CRYOSURGERY    . WISDOM TOOTH EXTRACTION      reports that she has never smoked. She has never used smokeless tobacco. She reports that she does not drink alcohol or use drugs. family history includes Arthritis in her mother; Cataracts in her father and mother; Glaucoma in her mother; Heart disease (age of onset: 60) in her father; Hyperlipidemia in her brother, father, and mother; Irritable bowel syndrome in her mother; Thyroid  disease in her sister; Ulcerative colitis in her brother. Allergies  Allergen Reactions  . Scopolamine Anaphylaxis  . Bactrim [Sulfamethoxazole-Trimethoprim]     Joint pain, fever  . Biaxin [Clarithromycin] Nausea And Vomiting    Projectile vomiting  . Tamiflu [Oseltamivir Phosphate] Nausea And Vomiting    Projectile vomiting  . Cephalexin Nausea And Vomiting  . Asa [Aspirin] Rash    Pt takes ibuprofen without problems     Review of Systems  Constitutional: Negative for chills and fever.       Objective:   Physical Exam  Constitutional: She appears well-developed and well-nourished.  Cardiovascular: Normal rate and regular rhythm.   Pulmonary/Chest: Effort normal and breath sounds normal. No respiratory distress. She has no wheezes. She has no rales.       Assessment:     History of persistent asthma which is generally well-controlled with Symbicort with recent exacerbation. Improving    Plan:     -We explained extreme caution not to ever overuse Symbicort. Strict use of 2 puffs twice daily -Continue Zantac as needed for GERD symptoms -We wrote prescription for prednisone 20 mgs take 2 tablets once daily for 5 days for future severe exacerbation -We discussed potential titration of Symbicort but at this point she is fairly well-controlled  Eulas Post MD [facility] at [facility]

## 2016-10-28 NOTE — Patient Instructions (Signed)
Do not ever take Symbicort > bid- limit to 2 puffs twice daily.

## 2016-11-03 ENCOUNTER — Ambulatory Visit
Admission: RE | Admit: 2016-11-03 | Discharge: 2016-11-03 | Disposition: A | Discharge status: Home / Self Care | Payer: 59 | Source: Ambulatory Visit | Attending: Family Medicine | Admitting: Family Medicine

## 2016-11-03 ENCOUNTER — Encounter: Payer: Self-pay | Admitting: Family Medicine

## 2016-11-03 DIAGNOSIS — R0602 Shortness of breath: Secondary | ICD-10-CM | POA: Diagnosis not present

## 2016-11-03 NOTE — Telephone Encounter (Signed)
Pt would like to have a call once this has been called in.

## 2016-11-04 ENCOUNTER — Ambulatory Visit (INDEPENDENT_AMBULATORY_CARE_PROVIDER_SITE_OTHER): Payer: 59 | Admitting: Family Medicine

## 2016-11-04 ENCOUNTER — Encounter: Payer: Self-pay | Admitting: Family Medicine

## 2016-11-04 VITALS — BP 140/90 | HR 91 | Temp 98.2°F | Wt 156.7 lb

## 2016-11-04 DIAGNOSIS — J4540 Moderate persistent asthma, uncomplicated: Secondary | ICD-10-CM

## 2016-11-04 DIAGNOSIS — R0600 Dyspnea, unspecified: Secondary | ICD-10-CM | POA: Diagnosis not present

## 2016-11-04 NOTE — Progress Notes (Signed)
Pre visit review using our clinic review tool, if applicable. No additional management support is needed unless otherwise documented below in the visit note. 

## 2016-11-04 NOTE — Patient Instructions (Signed)
Continue with night time use of Zantac and Xyzal Your EKG looks good We will call you with the lab done today.

## 2016-11-04 NOTE — Progress Notes (Signed)
Subjective:     Patient ID: Marissa Horn, female   DOB: 25-May-1971, 46 y.o.   MRN: [id number]  HPI Pt seen back today to evaluate some ongoing dyspnea.  She has hx of persistent asthma and takes Symbicort daily.  See note from visit last week.  Recent ?asthma flare and treated with both depomedrol and subsequent oral prednisone for 5 days.  Felt some better and than at beach over weekend developed some recurrent dyspnea.  Her husband started her on Zithromax which she is completing now.  No fever.  Her symptoms are improved somewhat today.  Denies any pleuritic pain, hemoptysis, prolonged travels, calf/leg pain or edema. Dyspnea very intermittent.  She denies panic attack hx.  Using Xopenex which helps some.   Never smoked.  CXR yesterday no acute findings. She took Zantac and Xyzal last night and rested better.  Past Medical History:  Diagnosis Date  . Allergy   . Anemia   . Arthritis    hands  . Asthma    ?, inhaler use as needed  . Complication of anesthesia 1990   muscle relaxant caused lung to collapse  . Difficult intubation 1990  . Dyslipidemia   . History of chicken pox   . IBS (irritable bowel syndrome)    ?  [name] Kitchen Migraine   . UTI (lower urinary tract infection)    Past Surgical History:  Procedure Laterality Date  . ABDOMINOPLASTY  05/04/2012   Procedure: ABDOMINOPLASTY;  Surgeon: Crissie Reese, MD;  Location: [facility];  Service: Plastics;  Laterality: N/A;  . ANAL FISSURE REPAIR     age 3  . EYE SURGERY Left    age 76  . GYNECOLOGIC CRYOSURGERY    . WISDOM TOOTH EXTRACTION      reports that she has never smoked. She has never used smokeless tobacco. She reports that she does not drink alcohol or use drugs. family history includes Arthritis in her mother; Cataracts in her father and mother; Glaucoma in her mother; Heart disease (age of onset: 33) in her father; Hyperlipidemia in her brother, father, and mother; Irritable bowel syndrome in her mother; Thyroid disease in  her sister; Ulcerative colitis in her brother. Allergies  Allergen Reactions  . Scopolamine Anaphylaxis  . Bactrim [Sulfamethoxazole-Trimethoprim]     Joint pain, fever  . Biaxin [Clarithromycin] Nausea And Vomiting    Projectile vomiting  . Tamiflu [Oseltamivir Phosphate] Nausea And Vomiting    Projectile vomiting  . Cephalexin Nausea And Vomiting  . Asa [Aspirin] Rash    Pt takes ibuprofen without problems     Review of Systems  Constitutional: Negative for chills and fever.  Respiratory: Positive for shortness of breath.   Cardiovascular: Negative for chest pain, palpitations and leg swelling.  Neurological: Negative for dizziness and syncope.       Objective:   Physical Exam  Constitutional: She appears well-developed and well-nourished.  Neck: Neck supple. No thyromegaly present.  Cardiovascular: Normal rate and regular rhythm.  Exam reveals no gallop and no friction rub.   No murmur heard. Pulmonary/Chest: Effort normal and breath sounds normal. No respiratory distress. She has no wheezes. She has no rales.  Musculoskeletal: She exhibits no edema.       Assessment:     Intermittent dyspnea.  Etiology unclear.  Not clear this is all related to her asthma as she has no wheezing on exam today and pulse ox 98% and no respiratory distress.  Low clinical suspicion for PE.  Plan:     -EKG NSR with no acute changes -CXR as above normal. -continue with Zantac and Xyzal -consider PFTs/pulmonary referral if symptoms persist/recur -check D-dimer  Eulas Post MD [facility]

## 2016-11-05 ENCOUNTER — Ambulatory Visit (INDEPENDENT_AMBULATORY_CARE_PROVIDER_SITE_OTHER)
Admission: RE | Admit: 2016-11-05 | Discharge: 2016-11-05 | Disposition: A | Discharge status: Home / Self Care | Payer: 59 | Source: Ambulatory Visit | Attending: Family Medicine | Admitting: Family Medicine

## 2016-11-05 ENCOUNTER — Other Ambulatory Visit: Payer: Self-pay | Admitting: Family Medicine

## 2016-11-05 DIAGNOSIS — R0602 Shortness of breath: Secondary | ICD-10-CM

## 2016-11-05 LAB — D-DIMER, QUANTITATIVE (NOT AT ARMC): D DIMER QUANT: 0.6 ug{FEU}/mL — AB (ref <0.50)

## 2016-11-07 ENCOUNTER — Ambulatory Visit: Payer: 59 | Admitting: Family Medicine

## 2016-11-30 ENCOUNTER — Encounter: Payer: Self-pay | Admitting: Family Medicine

## 2016-11-30 DIAGNOSIS — R053 Chronic cough: Secondary | ICD-10-CM

## 2016-11-30 DIAGNOSIS — R05 Cough: Secondary | ICD-10-CM

## 2016-11-30 DIAGNOSIS — R0600 Dyspnea, unspecified: Secondary | ICD-10-CM

## 2016-12-02 MED FILL — SYMBICORT 80-4.5 MCG INH: 80-4.5 | 30 days supply | Qty: 10 | Fill #1

## 2017-01-01 ENCOUNTER — Encounter: Payer: Self-pay | Admitting: Internal Medicine

## 2017-01-01 ENCOUNTER — Ambulatory Visit (INDEPENDENT_AMBULATORY_CARE_PROVIDER_SITE_OTHER): Payer: 59 | Admitting: Internal Medicine

## 2017-01-01 VITALS — BP 136/88 | HR 74 | Ht 61.0 in | Wt 159.0 lb

## 2017-01-01 DIAGNOSIS — J45991 Cough variant asthma: Secondary | ICD-10-CM | POA: Diagnosis not present

## 2017-01-01 LAB — NITRIC OXIDE: Nitric Oxide: 13

## 2017-01-01 NOTE — Progress Notes (Signed)
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  from [place]no indoor smoke)  until age 79 with bronchitis requiring abx no pred or abx and able to do sports fine s inhaler then moved to [place] 2003-10 where similar onset "bronchitis " rx abx but then needed pred/ neb but no maint rx sev times a year would occur esp with colds more in winter  Then moved to [place] rx symbicort helped some as less severe but still needed prednisone frequently so saw allergist in [place] cockroach and dust and moved to [place] 2012 where pattern changed with acute episodes s warning sometimes monthly and require neb which only works a little if at all then ends up needing "cortisone" shot works w/in a few minutes so referred to pulmonary clinic 01/01/2017 by Dr   Georgiann Mohs     01/01/2017 1st [facility] office visit/ Nema Oatley   Chief Complaint  Patient presents with  . Pulmonary Consult    Referred by Dr. Carolann Littler.  Pt c/o "asthma attacks" for years- she states her throat closes up and she can't breathe.   last spell occurred one month prior to OV  Requiring 2 steroid shots then about 2 weeks worth  Of prednisone  Many of the spells wake her from her sleep assoc severe cough assoc with loss voice and typically don't respond to saba.   Admits missing doses of symb 80 frequently and hfa is poor (see a/p)  No obvious day to day or daytime variability or assoc excess/ purulent sputum or mucus plugs or hemoptysis or cp or chest tightness, subjective wheeze or overt sinus or hb symptoms. No unusual exp hx or h/o childhood pna/ asthma or knowledge of premature birth.  Sleeping ok without nocturnal  or early am exacerbation  of respiratory  c/o's or need for noct saba. Also denies any obvious fluctuation of symptoms with weather or environmental changes or other aggravating or alleviating  factors except as outlined above   Current Medications, Allergies, Complete Past Medical History, Past Surgical History, Family History, and Social History were reviewed in [employer] record.  ROS  The following are not active complaints unless bolded sore throat, dysphagia, dental problems, itching, sneezing,  nasal congestion or excess/ purulent secretions, ear ache,   fever, chills, sweats, unintended wt loss, classically pleuritic or exertional cp,  orthopnea pnd or leg swelling, presyncope, palpitations, abdominal pain, anorexia, nausea, vomiting, diarrhea  or change in bowel or bladder habits, change in stools or urine, dysuria,hematuria,  rash, arthralgias, visual complaints, headache, numbness, weakness or ataxia or problems with walking or coordination,  change in mood/affect or memory.               Review of Systems     Objective:   Physical Exam   Very Pleasant quite talkative  amb wf nad s voice fatigue   Wt Readings from Last 3 Encounters:  01/01/17 159 lb (72.1 kg)  11/04/16 156 lb 11.2 oz (71.1 kg)  10/28/16 156 lb 9.6 oz (71 kg)    Vital signs reviewed  - Note on arrival 02 sats  96% on RA     HEENT: nl dentition, turbinates bilaterally, and oropharynx. Nl external ear canals without cough reflex   NECK :  without JVD/Nodes/TM/ nl carotid upstrokes bilaterally  LUNGS: no acc muscle use,  Nl contour chest which is clear to A and P bilaterally without cough on insp or exp maneuvers   CV:  RRR  no s3 or murmur or increase in P2, and no edema   ABD:  soft and nontender with nl inspiratory excursion in the supine position. No bruits or organomegaly appreciated, bowel sounds nl  MS:  Nl gait/ ext warm without deformities, calf tenderness, cyanosis or clubbing No obvious joint restrictions   SKIN: warm and dry without lesions    NEURO:  alert, approp, nl sensorium with  no motor or cerebellar deficits apparent.      I personally  reviewed images and agree with radiology impression as follows:   Chest CT a 11/05/16 Negative CT angiogram of the chest. No evidence of acute pulmonary embolism.    Assessment:

## 2017-01-01 NOTE — Patient Instructions (Signed)
Plan A = Automatic = symbicort 80 Take 2 puffs first thing in am and then another 2 puffs about 12 hours later.    Work on inhaler technique:  relax and gently blow all the way out then take a nice smooth deep breath back in, triggering the inhaler at same time you start breathing in.  Hold for up to 5 seconds if you can. Blow out thru nose. Rinse and gargle with water when done      Plan B = Backup Only use your xopenex hfa  as a rescue medication to be used if you can't catch your breath by resting or doing a relaxed purse lip breathing pattern.  - The less you use it, the better it will work when you need it. - Ok to use the inhaler up to 2 puffs  every 4 hours if you must but call for appointment if use goes up over your usual need - Don't leave home without it !!  (think of it like the spare tire for your car)   Plan C = Crisis - only use your xopenex nebulizer if you first try Plan B and it fails to help > ok to use the nebulizer up to every 4 hours but if start needing it regularly call for immediate appointment     GERD (REFLUX)  is an extremely common cause of respiratory symptoms just like yours , many times with no obvious heartburn at all.    It can be treated with medication, but also with lifestyle changes including elevation of the head of your bed (ideally with 6 inch  bed blocks),  Smoking cessation, avoidance of late meals, excessive alcohol, and avoid fatty foods, chocolate, peppermint, colas, red wine, and acidic juices such as orange juice.  NO MINT OR MENTHOL PRODUCTS SO NO COUGH DROPS   USE SUGARLESS CANDY INSTEAD (Jolley ranchers or Stover's or Life Savers) or even ice chips will also do - the key is to swallow to prevent all throat clearing. NO OIL BASED VITAMINS - use powdered substitutes.     At next spell strongly recommend immediately starting: Try prilosec otc 19m  Take 30-60 min before first meal of the day and Zantac 150 one @  bedtime until spell is gone  and back to your baseline   Please schedule a follow up office visit in 6 weeks, call sooner if needed

## 2017-01-02 NOTE — Assessment & Plan Note (Addendum)
FENO 01/01/2017  =   13 on symb 80 with poor hfa/ last dose > 12 h prior - Spirometry 01/01/2017  No obst/ no significant curvature s am rx  - 01/01/2017  After extensive coaching HFA effectiveness =    50% from near 0   The sudden onset of refractory dyspnea without the typical scenario of increasing cough congestion and  Partial  response to albuterol over 3-5 day period (hers occurs over 3-5 seconds does not respond albuterol but within seconds of getting a steroid shot, which is physiologically and pharmacologically impossible, making me wonder if she is not actually getting an adrenaline shot from her husband) is extremely unusual for asthma although there is no reason to believe she can't have both asthma and vocal cord dysfunction. If she does have vocal cord dysfunction the question is whether to being fueled by rhinitis or reflux or both with the latter being more likely in the absence of clear-cut rhinitis or seasonal pattern.  I would therefore first like her to use dark on Symbicort used perfectly regularly at a dose of only 82 puffs every 12 hours and institute a reflux diet along with acid suppression at the very onset of any symptoms.  I would be happy to see her during a flare. Alternatively, if we miss opportunity a methacholine challenge test  needs to be considered.  Total time devoted to counseling  > 50 % of initial 60 min office visit:  review case with pt/ discussion of options/alternatives/ personally creating written customized instructions  in presence of pt  then going over those specific  Instructions directly with the pt including how to use all of the meds but in particular covering each new medication in detail and the difference between the maintenance= "automatic" meds and the prns using an action plan format for the latter (If this problem/symptom => do that organization reading Left to right).  Please see AVS from this visit for a full list of these instructions which I  personally wrote for this pt and  are unique to this visit.

## 2017-01-08 ENCOUNTER — Encounter: Payer: Self-pay | Admitting: Obstetrics & Gynecology

## 2017-01-08 ENCOUNTER — Ambulatory Visit (INDEPENDENT_AMBULATORY_CARE_PROVIDER_SITE_OTHER): Payer: 59 | Admitting: Obstetrics & Gynecology

## 2017-01-08 VITALS — BP 128/86 | HR 80 | Resp 16 | Ht 60.5 in | Wt 158.0 lb

## 2017-01-08 DIAGNOSIS — Z01419 Encounter for gynecological examination (general) (routine) without abnormal findings: Secondary | ICD-10-CM

## 2017-01-08 MED ORDER — FLUCONAZOLE 150 MG PO TABS: 150.0000 mg | ORAL_TABLET | Freq: Once | ORAL | 1 refills | Status: AC

## 2017-01-08 NOTE — Progress Notes (Signed)
46 y.o. G3P2 Married [place] F here for annual exam.  Denies vaginal bleeding.  Having some issues with asthma this year.  Has been evaluated with Dr. Marlyn Corporal.  Has seen Dr. Melvyn Novas.    Patient's last menstrual period was 04/14/2012.          Sexually active: Yes.    The current method of family planning is status post hysterectomy.    Exercising: No.  The patient does not participate in regular exercise at present. Smoker:  no  Health Maintenance: Pap:  01/08/12 Normal  History of abnormal Pap:  Yes, remote Hx  MMG:  06/18/16 BIRADS1:Neg  Colonoscopy:  Never BMD:   never TDaP:  04/2013  Pneumonia vaccine(s):  N/A Zostavax:   N/A Hep C testing: N/A Screening Labs: Here   reports that she has never smoked. She has never used smokeless tobacco. She reports that she does not drink alcohol or use drugs.  Past Medical History:  Diagnosis Date  . Allergy   . Anemia   . Arthritis    hands  . Asthma    ?, inhaler use as needed  . Complication of anesthesia 1990   muscle relaxant caused lung to collapse  . Difficult intubation 1990  . Dyslipidemia   . History of chicken pox   . IBS (irritable bowel syndrome)    ?  [name] Kitchen Migraine   . UTI (lower urinary tract infection)     Past Surgical History:  Procedure Laterality Date  . ABDOMINOPLASTY  05/04/2012   Procedure: ABDOMINOPLASTY;  Surgeon: Crissie Reese, MD;  Location: [facility];  Service: Plastics;  Laterality: N/A;  . ANAL FISSURE REPAIR     age 33  . EYE SURGERY Left    age 75  . GYNECOLOGIC CRYOSURGERY    . WISDOM TOOTH EXTRACTION      Current Outpatient Prescriptions  Medication Sig Dispense Refill  . cyanocobalamin (,VITAMIN B-12,) 1000 MCG/ML injection Take B12 1,000 mcg IM once every 2 weeks for 2 months, then once monthly. 10 mL 2  . ergocalciferol (VITAMIN D2) 50000 units capsule Take 1 capsule (50,000 Units total) by mouth once a week. 12 capsule 3  . ketotifen (ZADITOR) 0.025 % ophthalmic solution Place 1 drop into both  eyes 2 (two) times daily.    [name] Kitchen levalbuterol (XOPENEX HFA) 45 MCG/ACT inhaler Inhale 1-2 puffs into the lungs every 6 (six) hours as needed for wheezing. 1 Inhaler 0  . levalbuterol (XOPENEX) 1.25 MG/3ML nebulizer solution Take 1.25 mg by nebulization every 4 (four) hours as needed for wheezing. 72 mL 0  . levocetirizine (XYZAL) 5 MG tablet Take 1 tablet (5 mg total) by mouth daily as needed. 90 tablet 3  . mometasone (ELOCON) 0.1 % cream Apply 1 application topically daily. 45 g 2  . mometasone (NASONEX) 50 MCG/ACT nasal spray Place 2 sprays into the nose daily. 17 g 12  . NEEDLE, DISP, 25 G (BD ECLIPSE NEEDLE) 25G X 5/8" MISC Use as directed. 10 each 5  . ranitidine (ZANTAC) 150 MG capsule Take 150 mg by mouth daily.     . SYMBICORT 80-4.5 MCG/ACT inhaler INHALE 2 PUFFS BY MOUTH INTO THE LUNGS 2 TIMES DAILY. 10.2 g 5   No current facility-administered medications for this visit.     Family History  Problem Relation Age of Onset  . Arthritis Mother   . Hyperlipidemia Mother   . Glaucoma Mother   . Irritable bowel syndrome Mother   . Cataracts Mother   .  Hyperlipidemia Father   . Heart disease Father 9       CAD  . Cataracts Father   . Thyroid disease Sister   . Hyperlipidemia Brother   . Ulcerative colitis Brother     ROS:  Pertinent items are noted in HPI.  Otherwise, a comprehensive ROS was negative.  Exam:   BP 128/86 (BP Location: Left Arm, Patient Position: Sitting, Cuff Size: Large)   Pulse 80   Resp 16   Ht 5' 0.5" (1.537 m)   Wt 158 lb (71.7 kg)   LMP 04/14/2012   BMI 30.35 kg/m   Weight change: +3#    Height: 5' 0.5" (153.7 cm)  Ht Readings from Last 3 Encounters:  01/08/17 5' 0.5" (1.537 m)  01/01/17 5' 1"  (1.549 m)  10/24/16 5' 1"  (1.549 m)    General appearance: alert, cooperative and appears stated age Head: Normocephalic, without obvious abnormality, atraumatic Neck: no adenopathy, supple, symmetrical, trachea midline and thyroid normal to inspection and  palpation Lungs: clear to auscultation bilaterally Breasts: normal appearance, no masses or tenderness Heart: regular rate and rhythm Abdomen: soft, non-tender; bowel sounds normal; no masses,  no organomegaly Extremities: extremities normal, atraumatic, no cyanosis or edema Skin: Skin color, texture, turgor normal. No rashes or lesions Lymph nodes: Cervical, supraclavicular, and axillary nodes normal. No abnormal inguinal nodes palpated Neurologic: Grossly normal   Pelvic: External genitalia:  Multiple small areas of folliculitis on mons              Urethra:  normal appearing urethra with no masses, tenderness or lesions              Bartholins and Skenes: normal                 Vagina: normal appearing vagina with normal color and discharge, no lesions              Cervix: absent              Pap taken: No. Bimanual Exam:  Uterus:  uterus absent              Adnexa: no mass, fullness, tenderness               Rectovaginal: Confirms               Anus:  normal sphincter tone, no lesions  Chaperone was present for exam.  A:  Well Woman with normal exam S/p robotic assisted TLH/bilateral salpingectomy 10/13 H/o elevated lipids followed by Dr. Elease Hashimoto H/O long term anemia that resolved with hysterectomy Recent asthma issues, seeing pulmologist Skin folliculitis (possibly from shaving)  P:   Mammogram guidelines reviewe pap smear not indicated Lab work is UTD Diflucan 113m po x 1, repeat 72 hours.  #2/1RF Recommended pt consider using Hibiclens to cleanse skin, stop shaving, or consider waxing Return annually or prn

## 2017-01-08 NOTE — Patient Instructions (Signed)
Hibiclens

## 2017-01-09 ENCOUNTER — Encounter: Payer: Self-pay | Admitting: Internal Medicine

## 2017-01-14 ENCOUNTER — Ambulatory Visit (INDEPENDENT_AMBULATORY_CARE_PROVIDER_SITE_OTHER): Payer: 59 | Admitting: Family Medicine

## 2017-01-14 ENCOUNTER — Encounter: Payer: Self-pay | Admitting: Family Medicine

## 2017-01-14 VITALS — BP 118/80 | HR 91 | Temp 98.5°F | Wt 158.9 lb

## 2017-01-14 DIAGNOSIS — J45991 Cough variant asthma: Secondary | ICD-10-CM

## 2017-01-14 DIAGNOSIS — R739 Hyperglycemia, unspecified: Secondary | ICD-10-CM

## 2017-01-14 DIAGNOSIS — E538 Deficiency of other specified B group vitamins: Secondary | ICD-10-CM | POA: Diagnosis not present

## 2017-01-14 NOTE — Progress Notes (Signed)
Subjective:     Patient ID: Marissa Horn, female   DOB: Jun 15, 1971, 46 y.o.   MRN: [id number]  HPI Patient here for follow-up regarding hyperglycemia. A1c back in January 6.8%. She's had poor compliance with diet and has gained a little weight since then. Not monitoring blood sugars. No polyuria or polydipsia. No family history of type 2 diabetes.  She's had some intermittent wheezing and suspected cough variant asthma possibly related to reflux. She saw pulmonologist. They recommended Prilosec which she has not yet started. She remains on Zantac twice daily. Pulmonary symptoms are stable currently on Symbicort  Past Medical History:  Diagnosis Date  . Allergy   . Anemia   . Arthritis    hands  . Asthma    ?, inhaler use as needed  . Complication of anesthesia 1990   muscle relaxant caused lung to collapse  . Difficult intubation 1990  . Dyslipidemia   . History of chicken pox   . IBS (irritable bowel syndrome)    ?  [name] Kitchen Migraine   . UTI (lower urinary tract infection)    Past Surgical History:  Procedure Laterality Date  . ABDOMINOPLASTY  05/04/2012   Procedure: ABDOMINOPLASTY;  Surgeon: Crissie Reese, MD;  Location: [facility];  Service: Plastics;  Laterality: N/A;  . ANAL FISSURE REPAIR     age 2  . EYE SURGERY Left    age 55  . GYNECOLOGIC CRYOSURGERY    . WISDOM TOOTH EXTRACTION      reports that she has never smoked. She has never used smokeless tobacco. She reports that she does not drink alcohol or use drugs. family history includes Arthritis in her mother; Cataracts in her father and mother; Glaucoma in her mother; Heart disease (age of onset: 66) in her father; Hyperlipidemia in her brother, father, and mother; Irritable bowel syndrome in her mother; Thyroid disease in her sister; Ulcerative colitis in her brother. Allergies  Allergen Reactions  . Scopolamine Anaphylaxis  . Bactrim [Sulfamethoxazole-Trimethoprim]     Joint pain, fever  . Biaxin [Clarithromycin] Nausea  And Vomiting    Projectile vomiting  . Tamiflu [Oseltamivir Phosphate] Nausea And Vomiting    Projectile vomiting  . Cephalexin Nausea And Vomiting  . Sulfa Antibiotics     Unknown reaction  . Asa [Aspirin] Rash    Pt takes ibuprofen without problems     Review of Systems  Constitutional: Negative for fatigue.  Eyes: Negative for visual disturbance.  Respiratory: Negative for cough, chest tightness, shortness of breath and wheezing.   Cardiovascular: Negative for chest pain, palpitations and leg swelling.  Gastrointestinal: Negative for abdominal pain.  Endocrine: Negative for polydipsia and polyuria.  Genitourinary: Negative for dysuria.  Neurological: Negative for dizziness, seizures, syncope, weakness, light-headedness and headaches.       Objective:   Physical Exam  Constitutional: She appears well-developed and well-nourished.  Eyes: Pupils are equal, round, and reactive to light.  Neck: Neck supple. No JVD present. No thyromegaly present.  Cardiovascular: Normal rate and regular rhythm.  Exam reveals no gallop.   Pulmonary/Chest: Effort normal and breath sounds normal. No respiratory distress. She has no wheezes. She has no rales.  Musculoskeletal: She exhibits no edema.  Neurological: She is alert.       Assessment:     #1 probably early type 2 diabetes. Recent poor compliance with diet and exercise.  A1C repeat today 6.4%.    #2 history of cough variant asthma possibly exacerbated by reflux  #3 history  of B12 deficiency    Plan:     -Recheck hemoglobin A1c -Continue with monthly B12 replacement-especially on PPI -Strongly encouraged to lose some weight and establish more consistent exercise. We've recommended combination of resistance training and aerobic -follow up in 6 months and repeat A1C then   Eulas Post MD [facility]

## 2017-02-09 ENCOUNTER — Ambulatory Visit: Payer: 59 | Admitting: Internal Medicine

## 2017-02-13 ENCOUNTER — Ambulatory Visit: Payer: 59 | Admitting: Internal Medicine

## 2017-02-24 ENCOUNTER — Ambulatory Visit: Payer: 59 | Admitting: Internal Medicine

## 2017-03-03 ENCOUNTER — Ambulatory Visit (INDEPENDENT_AMBULATORY_CARE_PROVIDER_SITE_OTHER): Payer: 59 | Admitting: Internal Medicine

## 2017-03-03 ENCOUNTER — Encounter: Payer: Self-pay | Admitting: Internal Medicine

## 2017-03-03 VITALS — BP 116/78 | HR 85 | Ht 60.5 in | Wt 157.0 lb

## 2017-03-03 DIAGNOSIS — J45991 Cough variant asthma: Secondary | ICD-10-CM | POA: Diagnosis not present

## 2017-03-03 NOTE — Progress Notes (Signed)
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  from [place] (no indoor smoke)  until age 87 with bronchitis requiring abx no pred or abx and able to do sports fine s inhaler then moved to [place] 2003-10 where similar onset "bronchitis " rx abx but then needed pred/ neb but no maint rx sev times a year would occur esp with colds more in winter  Then moved to [place] rx symbicort helped some as less severe but still needed prednisone frequently so saw allergist in [place] cockroach and dust and moved to [place] 2012 where pattern changed with acute episodes s warning sometimes monthly and require neb which only works a little if at all then ends up needing "cortisone" shot works w/in a few minutes so referred to pulmonary clinic 01/01/2017 by Dr   Georgiann Mohs    History of Present Illness  01/01/2017 1st [facility] office visit/ [name]   Chief Complaint  Patient presents with  . Pulmonary Consult    Referred by Dr. Carolann Littler.  Pt c/o "asthma attacks" for years- she states her throat closes up and she can't breathe.   last spell occurred one month prior to OV  Requiring 2 steroid shots then about 2 weeks worth  Of prednisone Many of the spells wake her from her sleep assoc severe cough assoc with loss voice and typically don't respond to saba.  Admits missing doses of symb 80 frequently and hfa is poor (see a/p)    03/03/2017  f/u ov/[name] re:  Chief Complaint  Patient presents with  . Follow-up    Breathing is doing well. No new co's today. She has not had to use her rescue inhaler.   Plan A = Automatic = symbicort 80 Take 2 puffs first thing in am and then another 2 puffs about 12 hours later.  Work on inhaler technique:  relax and gently blow all the way out then take a nice smooth deep breath back in, triggering the inhaler at same time you  start breathing in.  Hold for up to 5 seconds if you can. Blow out thru nose. Rinse and gargle with water when done Plan B = Backup Only use your xopenex hfa  as a rescue medication  Plan C = Crisis - only use your xopenex nebulizer if you first try Plan B and it fails to help > ok to use the nebulizer up to every 4 hours but if start needing it regularly call for immediate appointment GERD (REFLUX)   At next spell strongly recommend immediately starting: Try prilosec otc 91m  Take 30-60 min before first meal of the day and Zantac 150 one @  bedtime until spell is gone and back to your baseline   03/03/2017  f/u ov/[name] re: intermittent asthma vs vcd  Chief Complaint  Patient presents with  . Follow-up    Breathing is doing well. No new co's today. She has not had to use her rescue inhaler.   sleeping fine/ acitivtiy fine but no aerobics  Spells of "asthma" always Heralded by cough then severe obst w/in seconds and can't speak If touches dust mite then 3-5 days before "feels like throat closing up"  symb 80 1 puff twice daily / zantac 150 mg twice daily   No obvious day to  day or daytime variability or assoc excess/ purulent sputum or mucus plugs or hemoptysis or cp or chest tightness, subjective wheeze or overt sinus or hb symptoms. No unusual exp hx or h/o childhood pna/ asthma or knowledge of premature birth.  Sleeping ok without nocturnal  or early am exacerbation  of respiratory  c/o's or need for noct saba. Also denies any obvious fluctuation of symptoms with weather or environmental changes or other aggravating or alleviating factors except as outlined above   Current Medications, Allergies, Complete Past Medical History, Past Surgical History, Family History, and Social History were reviewed in [employer] record.  ROS  The following are not active complaints unless bolded sore throat, dysphagia, dental problems, itching, sneezing,  nasal congestion or excess/  purulent secretions, ear ache,   fever, chills, sweats, unintended wt loss, classically pleuritic or exertional cp,  orthopnea pnd or leg swelling, presyncope, palpitations, abdominal pain, anorexia, nausea, vomiting, diarrhea  or change in bowel or bladder habits, change in stools or urine, dysuria,hematuria,  rash, arthralgias, visual complaints, headache, numbness, weakness or ataxia or problems with walking or coordination,  change in mood/affect or memory.                    Objective:   Physical Exam   Very Pleasant [place] female nad   03/03/2017        157   01/01/17 159 lb (72.1 kg)  11/04/16 156 lb 11.2 oz (71.1 kg)  10/28/16 156 lb 9.6 oz (71 kg)    Vital signs reviewed  - Note on arrival 02 sats  96% on RA     HEENT: nl dentition, turbinates bilaterally, and oropharynx. Nl external ear canals without cough reflex   NECK :  without JVD/Nodes/TM/ nl carotid upstrokes bilaterally   LUNGS: no acc muscle use,  Nl contour chest which is clear to A and P bilaterally without cough on insp or exp maneuvers   CV:  RRR  no s3 or murmur or increase in P2, and no edema   ABD:  soft and nontender with nl inspiratory excursion in the supine position. No bruits or organomegaly appreciated, bowel sounds nl  MS:  Nl gait/ ext warm without deformities, calf tenderness, cyanosis or clubbing No obvious joint restrictions   SKIN: warm and dry without lesions    NEURO:  alert, approp, nl sensorium with  no motor or cerebellar deficits apparent.      I personally reviewed images and agree with radiology impression as follows:   Chest CT a 11/05/16 Negative CT angiogram of the chest. No evidence of acute pulmonary embolism.    Assessment:

## 2017-03-03 NOTE — Patient Instructions (Addendum)
At the very onset of any flare:   symbicort 80 Take 2 puffs first thing in am and then another 2 puffs about 12 hours later then resume prev rx    Prilosec 20 mg before bfast and zantac 300 mg at bedtime until all better x 2 weeks then resume previous rx   Work on inhaler technique:  relax and gently blow all the way out then take a nice smooth deep breath back in, triggering the inhaler at same time you start breathing in.  Hold for up to 5 seconds if you can. Blow out thru nose. Rinse and gargle with water when done   If not effective return asap - otherwise follow up her is as needed

## 2017-03-04 ENCOUNTER — Encounter: Payer: Self-pay | Admitting: Internal Medicine

## 2017-03-04 NOTE — Assessment & Plan Note (Addendum)
FENO 01/01/2017  =   13 on symb 80 with poor hfa/ last dose > 12 h prior - Spirometry 01/01/2017  No obst/ no significant curvature s am rx  - 01/01/2017  After extensive coaching HFA effectiveness =    50% from near 0   - 03/03/2017  After extensive coaching HFA effectiveness =    50% (delayed insp)   All goals of chronic asthma control met including optimal function and elimination of symptoms with minimal need for rescue therapy.  Contingencies discussed in full including contacting this office immediately if not controlling the symptoms using the rule of two's.     I had an extended discussion with the patient reviewing all relevant studies completed to date and  lasting 15 to 20 minutes of a 25 minute visit on the following ongoing concerns:   Doing better since rx with low dose symbicort (despite poor technique) and moderate acid suppression with h2 blockers so no change in rx needed  However, extensive contingency planning was reviewed - At the very onset of any flare:   symbicort 80 Take 2 puffs first thing in am and then another 2 puffs about 12 hours later then resume prev rx    Prilosec 20 mg before bfast and zantac 300 mg at bedtime until all better x 2 weeks then resume previous rx     Each maintenance medication was reviewed in detail including most importantly the difference between maintenance and prns and under what circumstances the prns are to be triggered using an action plan format that is not reflected in the computer generated alphabetically organized AVS.    Please see AVS for specific instructions unique to this visit that I personally wrote and verbalized to the the pt in detail and then reviewed with pt  by my nurse highlighting any  changes in therapy recommended at today's visit to their plan of care.

## 2017-03-26 MED FILL — SYMBICORT 80-4.5 MCG INH: 80-4.5 | 30 days supply | Qty: 10 | Fill #2

## 2017-04-01 ENCOUNTER — Ambulatory Visit (INDEPENDENT_AMBULATORY_CARE_PROVIDER_SITE_OTHER): Payer: 59 | Admitting: Family Medicine

## 2017-04-01 ENCOUNTER — Encounter: Payer: Self-pay | Admitting: Family Medicine

## 2017-04-01 VITALS — BP 110/80 | HR 95 | Temp 99.1°F | Wt 157.0 lb

## 2017-04-01 DIAGNOSIS — Z23 Encounter for immunization: Secondary | ICD-10-CM

## 2017-04-01 DIAGNOSIS — J069 Acute upper respiratory infection, unspecified: Secondary | ICD-10-CM | POA: Diagnosis not present

## 2017-04-01 DIAGNOSIS — B9789 Other viral agents as the cause of diseases classified elsewhere: Secondary | ICD-10-CM | POA: Diagnosis not present

## 2017-04-01 MED ORDER — PREDNISONE 20 MG PO TABS: ORAL_TABLET | ORAL | 0 refills | Status: DC

## 2017-04-01 NOTE — Progress Notes (Signed)
Subjective:     Patient ID: Marissa Horn, female   DOB: Jul 03, 1971, 46 y.o.   MRN: [id number]  HPI Patient seen with upper respiratory symptoms. She has history of asthma which is generally well-controlled with Symbicort. Also sore throat 3 days ago. Son has similar symptoms. Minimal nasal congestion. Increasing cough. She feels she may be having some wheezing intermittently. She is using her rescue inhaler past few days. No fever. No body aches. Denies any nausea, vomiting, or diarrhea.  Past Medical History:  Diagnosis Date  . Allergy   . Anemia   . Arthritis    hands  . Asthma    ?, inhaler use as needed  . Complication of anesthesia 1990   muscle relaxant caused lung to collapse  . Difficult intubation 1990  . Dyslipidemia   . History of chicken pox   . IBS (irritable bowel syndrome)    ?  [name] Kitchen Migraine   . UTI (lower urinary tract infection)    Past Surgical History:  Procedure Laterality Date  . ABDOMINOPLASTY  05/04/2012   Procedure: ABDOMINOPLASTY;  Surgeon: Crissie Reese, MD;  Location: [facility];  Service: Plastics;  Laterality: N/A;  . ANAL FISSURE REPAIR     age 7  . EYE SURGERY Left    age 55  . GYNECOLOGIC CRYOSURGERY    . WISDOM TOOTH EXTRACTION      reports that she has never smoked. She has never used smokeless tobacco. She reports that she does not drink alcohol or use drugs. family history includes Arthritis in her mother; Cataracts in her father and mother; Glaucoma in her mother; Heart disease (age of onset: 51) in her father; Hyperlipidemia in her brother, father, and mother; Irritable bowel syndrome in her mother; Thyroid disease in her sister; Ulcerative colitis in her brother. Allergies  Allergen Reactions  . Scopolamine Anaphylaxis  . Bactrim [Sulfamethoxazole-Trimethoprim]     Joint pain, fever  . Biaxin [Clarithromycin] Nausea And Vomiting    Projectile vomiting  . Tamiflu [Oseltamivir Phosphate] Nausea And Vomiting    Projectile vomiting  .  Cephalexin Nausea And Vomiting  . Sulfa Antibiotics     Unknown reaction  . Asa [Aspirin] Rash    Pt takes ibuprofen without problems     Review of Systems  Constitutional: Negative for chills and fever.  HENT: Positive for congestion and sore throat.   Respiratory: Positive for cough and wheezing.   Cardiovascular: Negative for chest pain.       Objective:   Physical Exam  Constitutional: She appears well-developed and well-nourished.  HENT:  Right Ear: External ear normal.  Left Ear: External ear normal.  Mouth/Throat: Oropharynx is clear and moist.  Neck: Neck supple.  Cardiovascular: Normal rate and regular rhythm.   Pulmonary/Chest: Effort normal and breath sounds normal. No respiratory distress. She has no wheezes. She has no rales.  Lymphadenopathy:    She has no cervical adenopathy.       Assessment:     Probable viral URI with cough. She has history of asthma and describes some intermittent wheezing though none heard on exam. No respiratory distress    Plan:     -Continue with her Symbicort and rescue inhaler as needed -Wrote prescription for prednisone 20 mg take 2 tablets once daily for 5 days if she has any concern for increased asthma flare wheezing -Follow-up promptly for any fever or other concerns  Eulas Post MD [facility] at [facility]

## 2017-04-10 MED FILL — VIT D2 1.25 MG (50,000 UNIT: 1.25 MG | 84 days supply | Qty: 12 | Fill #1

## 2017-04-10 MED FILL — MOMETASONE FUROATE 50 MCG S: 50 | 30 days supply | Qty: 17 | Fill #1

## 2017-04-10 MED FILL — CYANOCOBALAMIN 1,000 MCG/ML: 1000 | 90 days supply | Qty: 5 | Fill #1

## 2017-04-23 ENCOUNTER — Encounter: Payer: Self-pay | Admitting: Family Medicine

## 2017-05-10 ENCOUNTER — Encounter: Payer: Self-pay | Admitting: Obstetrics & Gynecology

## 2017-05-11 ENCOUNTER — Encounter: Payer: Self-pay | Admitting: Obstetrics & Gynecology

## 2017-05-11 ENCOUNTER — Ambulatory Visit (INDEPENDENT_AMBULATORY_CARE_PROVIDER_SITE_OTHER): Payer: 59 | Admitting: Obstetrics & Gynecology

## 2017-05-11 VITALS — BP 136/96 | HR 100 | Resp 16 | Ht 60.5 in | Wt 156.0 lb

## 2017-05-11 DIAGNOSIS — N764 Abscess of vulva: Secondary | ICD-10-CM

## 2017-05-11 MED ORDER — DOXYCYCLINE HYCLATE 100 MG PO CAPS: 100.0000 mg | ORAL_CAPSULE | Freq: Two times a day (BID) | ORAL | 0 refills | Status: DC

## 2017-05-11 MED ORDER — FLUCONAZOLE 150 MG PO TABS: 150.0000 mg | ORAL_TABLET | Freq: Once | ORAL | 0 refills | Status: AC

## 2017-05-11 MED ORDER — HYDROCODONE-ACETAMINOPHEN 5-325 MG PO TABS: 1.0000 | ORAL_TABLET | Freq: Four times a day (QID) | ORAL | 0 refills | Status: DC | PRN

## 2017-05-11 MED FILL — HYDROCODON-APAP 5-325: 5-325 | 2 days supply | Qty: 10 | Fill #0

## 2017-05-11 MED FILL — DOXYCYCLINE HYC 100 MG TAB: 100 | 7 days supply | Qty: 14 | Fill #0

## 2017-05-11 MED FILL — FLUCONAZOLE 150 MG TABLET: 150 | 6 days supply | Qty: 3 | Fill #0

## 2017-05-11 NOTE — Progress Notes (Signed)
GYNECOLOGY  VISIT  CC:   Painful vulvar boil  HPI: 46 y.o. G3P2 Married [place] female here for vulvar boil that has been present about five days.  Pt leaving on Friday to go to [facility].  Thought she could treat this at home at first.  Used hot compresses, baths but has gradually worsened.  Having trouble sitting and walking due to pain.  No fevers.  No vaginal discharge or bleeding.  No aches/chilss.  Pt watched "pimple popper" videos on You Tube and wants to know if I will be able to remove cyst wall from boil today.  D/w sebaceous cyst and vulvar abscess are not the same thing and that there won't be a cyst wall for removal.    Denies bowel or bladder changes.  GYNECOLOGIC HISTORY: Patient's last menstrual period was 04/14/2012. Contraception: hysterectomy  Menopausal hormone therapy: none  Patient Active Problem List   Diagnosis Date Noted  . 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 Hyperglycemia 11/29/2015  . Vitamin D deficiency 07/25/2015  . Vitamin B 12 deficiency 07/25/2015  . Perennial allergic rhinitis 09/26/2014  . IBS (irritable bowel syndrome) 05/10/2014  . Other and unspecified hyperlipidemia 12/15/2012  . Overactive bladder 05/04/2012  . Asthma, moderate persistent 01/14/2012    Past Medical History:  Diagnosis Date  . Allergy   . Anemia   . Arthritis    hands  . Asthma    ?, inhaler use as needed  . Complication of anesthesia 1990   muscle relaxant caused lung to collapse  . Difficult intubation 1990  . Dyslipidemia   . History of chicken pox   . IBS (irritable bowel syndrome)    ?  [name] Kitchen Migraine   . UTI (lower urinary tract infection)     Past Surgical History:  Procedure Laterality Date  . ABDOMINOPLASTY  05/04/2012   Procedure: ABDOMINOPLASTY;  Surgeon: Crissie Reese, MD;  Location: [facility];  Service: Plastics;  Laterality: N/A;  . ANAL FISSURE REPAIR     age 3  . EYE SURGERY Left    age 79  . GYNECOLOGIC CRYOSURGERY    . WISDOM TOOTH  EXTRACTION      MEDS:   Current Outpatient Prescriptions on File Prior to Visit  Medication Sig Dispense Refill  . cyanocobalamin (,VITAMIN B-12,) 1000 MCG/ML injection Take B12 1,000 mcg IM once every 2 weeks for 2 months, then once monthly. 10 mL 2  . ergocalciferol (VITAMIN D2) 50000 units capsule Take 1 capsule (50,000 Units total) by mouth once a week. 12 capsule 3  . ketotifen (ZADITOR) 0.025 % ophthalmic solution Place 1 drop into both eyes 2 (two) times daily.    [name] Kitchen levalbuterol (XOPENEX HFA) 45 MCG/ACT inhaler Inhale 1-2 puffs into the lungs every 6 (six) hours as needed for wheezing. 1 Inhaler 0  . levalbuterol (XOPENEX) 1.25 MG/3ML nebulizer solution Take 1.25 mg by nebulization every 4 (four) hours as needed for wheezing. 72 mL 0  . levocetirizine (XYZAL) 5 MG tablet Take 1 tablet (5 mg total) by mouth daily as needed. 90 tablet 3  . mometasone (ELOCON) 0.1 % cream Apply 1 application topically daily. (Patient taking differently: Apply 1 application topically daily as needed. ) 45 g 2  . mometasone (NASONEX) 50 MCG/ACT nasal spray Place 2 sprays into the nose daily. 17 g 12  . NEEDLE, DISP, 25 G (BD ECLIPSE NEEDLE) 25G X 5/8" MISC Use as directed. 10 each 5  . ranitidine (ZANTAC) 150 MG capsule Take  150 mg by mouth 2 (two) times daily.     . SYMBICORT 80-4.5 MCG/ACT inhaler INHALE 2 PUFFS BY MOUTH INTO THE LUNGS 2 TIMES DAILY. 10.2 g 5   No current facility-administered medications on file prior to visit.     ALLERGIES: Scopolamine; Bactrim [sulfamethoxazole-trimethoprim]; Biaxin [clarithromycin]; Tamiflu [oseltamivir phosphate]; Cephalexin; Sulfa antibiotics; and Asa [aspirin]  Family History  Problem Relation Age of Onset  . Arthritis Mother   . Hyperlipidemia Mother   . Glaucoma Mother   . Irritable bowel syndrome Mother   . Cataracts Mother   . Hyperlipidemia Father   . Heart disease Father 32       CAD  . Cataracts Father   . Thyroid disease Sister   .  Hyperlipidemia Brother   . Ulcerative colitis Brother     SH:  Married, non smoker  Review of Systems  Constitutional: Negative.   Respiratory: Negative.   Cardiovascular: Negative.   Genitourinary:       Vulvar pain    PHYSICAL EXAMINATION:    BP (!) 136/96 (BP Location: Left Arm, Patient Position: Sitting, Cuff Size: Normal)   Pulse 100   Resp 16   Ht 5' 0.5" (1.537 m)   Wt 156 lb (70.8 kg)   LMP 04/14/2012   BMI 29.97 kg/m     Physical Exam  Constitutional: She is oriented to person, place, and time. She appears well-developed.  Genitourinary: Vagina normal.    There is tenderness and lesion on the right labia. There is no rash or injury on the right labia. There is no rash, tenderness, lesion or injury on the left labia.  Lymphadenopathy:       Right: No inguinal adenopathy present.       Left: No inguinal adenopathy present.  Neurological: She is alert and oriented to person, place, and time.  Skin: Skin is warm and dry.  Psychiatric: She has a normal mood and affect.   Chaperone was present for exam.  I&D recommended.  Pt gave verbal consent.  Area cleansed with Betadine x 3.  Using sterile technique, lesion anesthetized with 1% Lidocaine Lot:  [id number]  Esp:  08/2018. 2cc total used.  Using #11 blade, lesion opened with purulent drainage noted.  Culture obtained.  Lesion probed.  Less than 1cm deep.  No loculations.  Could not pack as not very deep.  No dressing applied.  Skin cleaned.  Assessment: Right labia minor abscess, s/p I&D today  Plan: Doxycline 144m bid x 7 days.  Warm compressed TID.  Hibiclens for washing advised.  Pt will give update tomorrow to see if needs tt be seen again tomorrow or on Thursday.

## 2017-05-11 NOTE — Patient Instructions (Signed)
Hibiclens. Grandfather

## 2017-05-12 ENCOUNTER — Encounter: Payer: Self-pay | Admitting: Obstetrics & Gynecology

## 2017-05-12 ENCOUNTER — Telehealth: Payer: Self-pay | Admitting: *Deleted

## 2017-05-12 ENCOUNTER — Ambulatory Visit (INDEPENDENT_AMBULATORY_CARE_PROVIDER_SITE_OTHER): Payer: 59 | Admitting: Obstetrics & Gynecology

## 2017-05-12 VITALS — BP 116/82 | HR 86 | Temp 98.2°F | Resp 14 | Ht 60.5 in | Wt 157.0 lb

## 2017-05-12 DIAGNOSIS — N764 Abscess of vulva: Secondary | ICD-10-CM

## 2017-05-12 NOTE — Telephone Encounter (Signed)
Call to patient. States area initially much better yesterday. Now area has started with new "bubble" and increased discomfort, not as painful as yesterday but is increasing again.  Advised should have recheck appointment. Patient with very limited availability this afternoon so will come now for work-in.   Routing to provider for final review. Patient agreeable to disposition. Will close encounter.

## 2017-05-12 NOTE — Telephone Encounter (Signed)
My Chart message from patient 05-12-17 approx 11:00am  So sorry for sending you a message now only. I was trying to assess my situation better, before communicating with you.  The sight was draining long after the procedure yesterday, and when it stopped it became a bubble again. Is that normal?     Marissa Horn,  [place]

## 2017-05-13 ENCOUNTER — Encounter: Payer: Self-pay | Admitting: Obstetrics & Gynecology

## 2017-05-14 LAB — WOUND CULTURE: ORGANISM ID, BACTERIA: NONE SEEN

## 2017-05-15 ENCOUNTER — Ambulatory Visit: Payer: 59 | Admitting: Obstetrics & Gynecology

## 2017-05-15 NOTE — Progress Notes (Signed)
GYNECOLOGY  VISIT  CC:   Still feel "bubble"  HPI: 46 y.o. G3P2 Married [place] female here for recheck after I&D of labia minora abscess.  States area drained yesterday and tenderness is much better but today it still feels like there is a bubble present, like the abscess is reforming.  Denies fever.  Pain is improved as well.Marissa Horn  GYNECOLOGIC HISTORY: Patient's last menstrual period was 04/14/2012. Contraception: hysterectomy Menopausal hormone therapy: none  Patient Active Problem List   Diagnosis Date Noted  . 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 Hyperglycemia 11/29/2015  . Vitamin D deficiency 07/25/2015  . Vitamin B 12 deficiency 07/25/2015  . Perennial allergic rhinitis 09/26/2014  . IBS (irritable bowel syndrome) 05/10/2014  . Other and unspecified hyperlipidemia 12/15/2012  . Overactive bladder 05/04/2012  . Asthma, moderate persistent 01/14/2012    Past Medical History:  Diagnosis Date  . Allergy   . Anemia   . Arthritis    hands  . Asthma    ?, inhaler use as needed  . Complication of anesthesia 1990   muscle relaxant caused lung to collapse  . Difficult intubation 1990  . Dyslipidemia   . History of chicken pox   . IBS (irritable bowel syndrome)    ?  Marissa Horn Migraine   . UTI (lower urinary tract infection)     Past Surgical History:  Procedure Laterality Date  . ABDOMINOPLASTY  05/04/2012   Procedure: ABDOMINOPLASTY;  Surgeon: Crissie Reese, MD;  Location: [facility];  Service: Plastics;  Laterality: N/A;  . ANAL FISSURE REPAIR     age 42  . EYE SURGERY Left    age 89  . GYNECOLOGIC CRYOSURGERY    . WISDOM TOOTH EXTRACTION      MEDS:   Current Outpatient Prescriptions on File Prior to Visit  Medication Sig Dispense Refill  . cyanocobalamin (,VITAMIN B-12,) 1000 MCG/ML injection Take B12 1,000 mcg IM once every 2 weeks for 2 months, then once monthly. 10 mL 2  . doxycycline (VIBRAMYCIN) 100 MG capsule Take 1 capsule (100 mg total) by mouth 2 (two) times  daily. 14 capsule 0  . ergocalciferol (VITAMIN D2) 50000 units capsule Take 1 capsule (50,000 Units total) by mouth once a week. 12 capsule 3  . HYDROcodone-acetaminophen (NORCO/VICODIN) 5-325 MG tablet Take 1-2 tablets by mouth every 6 (six) hours as needed for moderate pain or severe pain. 10 tablet 0  . ketotifen (ZADITOR) 0.025 % ophthalmic solution Place 1 drop into both eyes 2 (two) times daily.    Marissa Horn levalbuterol (XOPENEX HFA) 45 MCG/ACT inhaler Inhale 1-2 puffs into the lungs every 6 (six) hours as needed for wheezing. 1 Inhaler 0  . levalbuterol (XOPENEX) 1.25 MG/3ML nebulizer solution Take 1.25 mg by nebulization every 4 (four) hours as needed for wheezing. 72 mL 0  . levocetirizine (XYZAL) 5 MG tablet Take 1 tablet (5 mg total) by mouth daily as needed. 90 tablet 3  . mometasone (ELOCON) 0.1 % cream Apply 1 application topically daily. (Patient taking differently: Apply 1 application topically daily as needed. ) 45 g 2  . mometasone (NASONEX) 50 MCG/ACT nasal spray Place 2 sprays into the nose daily. 17 g 12  . NEEDLE, DISP, 25 G (BD ECLIPSE NEEDLE) 25G X 5/8" MISC Use as directed. 10 each 5  . ranitidine (ZANTAC) 150 MG capsule Take 150 mg by mouth 2 (two) times daily.     . SYMBICORT 80-4.5 MCG/ACT inhaler INHALE 2 PUFFS BY MOUTH INTO THE  LUNGS 2 TIMES DAILY. 10.2 g 5   No current facility-administered medications on file prior to visit.     ALLERGIES: Scopolamine; Bactrim [sulfamethoxazole-trimethoprim]; Biaxin [clarithromycin]; Tamiflu [oseltamivir phosphate]; Cephalexin; Sulfa antibiotics; and Asa [aspirin]  Family History  Problem Relation Age of Onset  . Arthritis Mother   . Hyperlipidemia Mother   . Glaucoma Mother   . Irritable bowel syndrome Mother   . Cataracts Mother   . Hyperlipidemia Father   . Heart disease Father 74       CAD  . Cataracts Father   . Thyroid disease Sister   . Hyperlipidemia Brother   . Ulcerative colitis Brother     SH:  Married, non  smoker  Review of Systems  Constitutional: Negative.   Gastrointestinal: Negative.   Genitourinary: Negative for dysuria, flank pain, frequency, hematuria and urgency.    PHYSICAL EXAMINATION:    BP 116/82 (BP Location: Right Arm, Patient Position: Sitting, Cuff Size: Normal)   Pulse 86   Temp 98.2 F (36.8 C) (Oral)   Resp 14   Ht 5' 0.5" (1.537 m)   Wt 157 lb (71.2 kg)   LMP 04/14/2012   BMI 30.16 kg/m     General appearance: alert, cooperative and appears stated age  Pelvic: External genitalia:  Much smaller right labia minora with less erythema present, induration of labia minor present but no fluctuance and no evidence for recurrent abscess.              Urethra:  normal appearing urethra with no masses, tenderness or lesions              Bartholins and Skenes: normal                Chaperone was present for exam.  Assessment: S/p I&D vulvar abscess yesterday, improved  Plan: Culture still pending Pt will finish the doxycycline Sitz bath/hot compresses or showeirng/bathing 3 times daily

## 2017-05-27 ENCOUNTER — Encounter: Payer: Self-pay | Admitting: Family Medicine

## 2017-05-27 ENCOUNTER — Ambulatory Visit (INDEPENDENT_AMBULATORY_CARE_PROVIDER_SITE_OTHER): Payer: 59 | Admitting: Family Medicine

## 2017-05-27 VITALS — BP 108/78 | HR 69 | Temp 98.3°F | Wt 158.7 lb

## 2017-05-27 DIAGNOSIS — R7303 Prediabetes: Secondary | ICD-10-CM

## 2017-05-27 NOTE — Patient Instructions (Signed)

## 2017-05-27 NOTE — Progress Notes (Signed)
Subjective:     Patient ID: Marissa Horn, female   DOB: April 04, 1971, 46 y.o.   MRN: [id number]  HPI Patient comes in today initially thinking she was scheduled for complete physical. She actually had physical back in January 2018. She does have history of prediabetes. Last A1c on record 6.8%. She states that we checked A1c last visit that we have no record. Her weight is essentially unchanged. She is fairly cautious in watching out for sugar and starch intake but does not do any exercise currently. No polyuria or polydipsia. She recalls most recent A1c 6.1%  Past Medical History:  Diagnosis Date  . Allergy   . Anemia   . Arthritis    hands  . Asthma    ?, inhaler use as needed  . Complication of anesthesia 1990   muscle relaxant caused lung to collapse  . Difficult intubation 1990  . Dyslipidemia   . History of chicken pox   . IBS (irritable bowel syndrome)    ?  [name] Kitchen Migraine   . UTI (lower urinary tract infection)    Past Surgical History:  Procedure Laterality Date  . ABDOMINOPLASTY  05/04/2012   Procedure: ABDOMINOPLASTY;  Surgeon: Crissie Reese, MD;  Location: [facility];  Service: Plastics;  Laterality: N/A;  . ANAL FISSURE REPAIR     age 5  . EYE SURGERY Left    age 49  . GYNECOLOGIC CRYOSURGERY    . WISDOM TOOTH EXTRACTION      reports that she has never smoked. She has never used smokeless tobacco. She reports that she does not drink alcohol or use drugs. family history includes Arthritis in her mother; Cataracts in her father and mother; Glaucoma in her mother; Heart disease (age of onset: 50) in her father; Hyperlipidemia in her brother, father, and mother; Irritable bowel syndrome in her mother; Thyroid disease in her sister; Ulcerative colitis in her brother. Allergies  Allergen Reactions  . Scopolamine Anaphylaxis  . Bactrim [Sulfamethoxazole-Trimethoprim]     Joint pain, fever  . Biaxin [Clarithromycin] Nausea And Vomiting    Projectile vomiting  . Tamiflu  [Oseltamivir Phosphate] Nausea And Vomiting    Projectile vomiting  . Cephalexin Nausea And Vomiting  . Sulfa Antibiotics     Unknown reaction  . Asa [Aspirin] Rash    Pt takes ibuprofen without problems     Review of Systems  Constitutional: Negative for fatigue.  Eyes: Negative for visual disturbance.  Respiratory: Negative for cough, chest tightness, shortness of breath and wheezing.   Cardiovascular: Negative for chest pain, palpitations and leg swelling.  Endocrine: Negative for polydipsia and polyuria.  Neurological: Negative for dizziness, seizures, syncope, weakness, light-headedness and headaches.       Objective:   Physical Exam  Constitutional: She appears well-developed and well-nourished.  Eyes: Pupils are equal, round, and reactive to light.  Neck: Neck supple. No JVD present. No thyromegaly present.  Cardiovascular: Normal rate and regular rhythm.  Exam reveals no gallop.   Pulmonary/Chest: Effort normal and breath sounds normal. No respiratory distress. She has no wheezes. She has no rales.  Musculoskeletal: She exhibits no edema.  Neurological: She is alert.       Assessment:     History of prediabetes    Plan:     -discussed prevention and lifestyle management -Hopefully can keep her off medications. We offered A1c today and have decided to wait and obtain full labs in January at follow-up physical -Strongly advocate increasing her exercise with combination  of aerobic and resistance training  Eulas Post MD [facility] at [facility]

## 2017-06-19 ENCOUNTER — Other Ambulatory Visit: Payer: Self-pay | Admitting: Obstetrics & Gynecology

## 2017-06-19 DIAGNOSIS — Z1231 Encounter for screening mammogram for malignant neoplasm of breast: Secondary | ICD-10-CM

## 2017-06-22 MED FILL — SYMBICORT 80-4.5 MCG INH: 80-4.5 | 30 days supply | Qty: 10 | Fill #3

## 2017-07-20 ENCOUNTER — Ambulatory Visit
Admission: RE | Admit: 2017-07-20 | Discharge: 2017-07-20 | Disposition: A | Discharge status: Home / Self Care | Payer: 59 | Source: Ambulatory Visit | Attending: Obstetrics & Gynecology | Admitting: Obstetrics & Gynecology

## 2017-07-20 DIAGNOSIS — Z1231 Encounter for screening mammogram for malignant neoplasm of breast: Secondary | ICD-10-CM

## 2017-08-05 DIAGNOSIS — L738 Other specified follicular disorders: Secondary | ICD-10-CM | POA: Diagnosis not present

## 2017-08-05 DIAGNOSIS — L700 Acne vulgaris: Secondary | ICD-10-CM | POA: Diagnosis not present

## 2017-08-05 MED FILL — VIT D2 1.25 MG (50,000 UNIT: 1.25 MG | 84 days supply | Qty: 12 | Fill #2

## 2017-08-05 MED FILL — CYANOCOBALAMIN 1,000 MCG/ML: 1000 | 84 days supply | Qty: 3 | Fill #2

## 2017-08-05 MED FILL — CLINDAMYCIN PHOSP 1% LOTION: 1 | 30 days supply | Qty: 60 | Fill #0

## 2017-08-20 MED FILL — SYMBICORT 80-4.5 MCG INH: 80-4.5 | 30 days supply | Qty: 10 | Fill #4

## 2017-08-24 ENCOUNTER — Encounter: Payer: 59 | Admitting: Family Medicine

## 2017-09-02 ENCOUNTER — Ambulatory Visit (INDEPENDENT_AMBULATORY_CARE_PROVIDER_SITE_OTHER): Payer: 59 | Admitting: Family Medicine

## 2017-09-02 ENCOUNTER — Encounter: Payer: Self-pay | Admitting: Family Medicine

## 2017-09-02 VITALS — BP 110/80 | HR 75 | Temp 98.3°F | Ht 60.0 in | Wt 155.7 lb

## 2017-09-02 DIAGNOSIS — E559 Vitamin D deficiency, unspecified: Secondary | ICD-10-CM | POA: Diagnosis not present

## 2017-09-02 DIAGNOSIS — R739 Hyperglycemia, unspecified: Secondary | ICD-10-CM

## 2017-09-02 DIAGNOSIS — E538 Deficiency of other specified B group vitamins: Secondary | ICD-10-CM

## 2017-09-02 DIAGNOSIS — Z789 Other specified health status: Secondary | ICD-10-CM

## 2017-09-02 DIAGNOSIS — J4520 Mild intermittent asthma, uncomplicated: Secondary | ICD-10-CM | POA: Diagnosis not present

## 2017-09-02 DIAGNOSIS — Z0000 Encounter for general adult medical examination without abnormal findings: Secondary | ICD-10-CM

## 2017-09-02 LAB — CBC WITH DIFFERENTIAL/PLATELET
BASOS ABS: 0.1 10*3/uL (ref 0.0–0.1)
BASOS PCT: 1.1 % (ref 0.0–3.0)
EOS ABS: 0.1 10*3/uL (ref 0.0–0.7)
Eosinophils Relative: 1.8 % (ref 0.0–5.0)
HEMATOCRIT: 35.9 % — AB (ref 36.0–46.0)
HEMOGLOBIN: 11.9 g/dL — AB (ref 12.0–15.0)
LYMPHS PCT: 36.3 % (ref 12.0–46.0)
Lymphs Abs: 2.1 10*3/uL (ref 0.7–4.0)
MCHC: 33.1 g/dL (ref 30.0–36.0)
MCV: 85 fl (ref 78.0–100.0)
Monocytes Absolute: 0.4 10*3/uL (ref 0.1–1.0)
Monocytes Relative: 6.4 % (ref 3.0–12.0)
NEUTROS ABS: 3.1 10*3/uL (ref 1.4–7.7)
Neutrophils Relative %: 54.4 % (ref 43.0–77.0)
PLATELETS: 323 10*3/uL (ref 150.0–400.0)
RBC: 4.22 Mil/uL (ref 3.87–5.11)
RDW: 14.7 % (ref 11.5–15.5)
WBC: 5.8 10*3/uL (ref 4.0–10.5)

## 2017-09-02 LAB — HEPATIC FUNCTION PANEL
ALT: 14 U/L (ref 0–35)
AST: 12 U/L (ref 0–37)
Albumin: 4.1 g/dL (ref 3.5–5.2)
Alkaline Phosphatase: 49 U/L (ref 39–117)
BILIRUBIN DIRECT: 0.1 mg/dL (ref 0.0–0.3)
BILIRUBIN TOTAL: 1 mg/dL (ref 0.2–1.2)
Total Protein: 6.6 g/dL (ref 6.0–8.3)

## 2017-09-02 LAB — BASIC METABOLIC PANEL
BUN: 7 mg/dL (ref 6–23)
CO2: 28 mEq/L (ref 19–32)
CREATININE: 0.64 mg/dL (ref 0.40–1.20)
Calcium: 8.7 mg/dL (ref 8.4–10.5)
Chloride: 100 mEq/L (ref 96–112)
GFR: 106.34 mL/min (ref >60.00)
Glucose, Bld: 103 mg/dL — ABNORMAL HIGH (ref 70–99)
Potassium: 3.9 mEq/L (ref 3.5–5.1)
Sodium: 136 mEq/L (ref 135–145)

## 2017-09-02 LAB — LIPID PANEL
CHOL/HDL RATIO: 6
CHOLESTEROL: 235 mg/dL — AB (ref 0–200)
HDL: 41.1 mg/dL (ref >39.00)
NonHDL: 193.93
Triglycerides: 287 mg/dL — ABNORMAL HIGH (ref 0.0–149.0)
VLDL: 57.4 mg/dL — ABNORMAL HIGH (ref 0.0–40.0)

## 2017-09-02 LAB — VITAMIN D 25 HYDROXY (VIT D DEFICIENCY, FRACTURES): VITD: 27.49 ng/mL — ABNORMAL LOW (ref 30.00–100.00)

## 2017-09-02 LAB — TSH: TSH: 2.66 u[IU]/mL (ref 0.35–4.50)

## 2017-09-02 LAB — VITAMIN B12: VITAMIN B 12: 323 pg/mL (ref 211–911)

## 2017-09-02 LAB — HEMOGLOBIN A1C: HEMOGLOBIN A1C: 6.6 % — AB (ref 4.6–6.5)

## 2017-09-02 LAB — LDL CHOLESTEROL, DIRECT: Direct LDL: 144 mg/dL

## 2017-09-02 MED ORDER — LEVALBUTEROL HCL 1.25 MG/3ML IN NEBU: 1.2500 mg | INHALATION_SOLUTION | RESPIRATORY_TRACT | 0 refills | Status: DC | PRN

## 2017-09-02 MED ORDER — MOMETASONE FUROATE 50 MCG/ACT NA SUSP: 2.0000 | Freq: Every day | NASAL | 12 refills | Status: DC

## 2017-09-02 MED ORDER — LEVOCETIRIZINE DIHYDROCHLORIDE 5 MG PO TABS: 5.0000 mg | ORAL_TABLET | Freq: Every day | ORAL | 3 refills | Status: DC | PRN

## 2017-09-02 MED ORDER — BUDESONIDE-FORMOTEROL FUMARATE 80-4.5 MCG/ACT IN AERO: INHALATION_SPRAY | RESPIRATORY_TRACT | 5 refills | Status: DC

## 2017-09-02 MED ORDER — LEVALBUTEROL TARTRATE 45 MCG/ACT IN AERO: 1.0000 | INHALATION_SPRAY | Freq: Four times a day (QID) | RESPIRATORY_TRACT | 0 refills | Status: DC | PRN

## 2017-09-02 MED ORDER — CYANOCOBALAMIN 1000 MCG/ML IJ SOLN: INTRAMUSCULAR | 2 refills | Status: DC

## 2017-09-02 MED ORDER — ERGOCALCIFEROL 1.25 MG (50000 UT) PO CAPS: 50000.0000 [IU] | ORAL_CAPSULE | ORAL | 3 refills | Status: DC

## 2017-09-02 MED FILL — LEVOCETIRIZINE 5 MG TABLET: 5 | 90 days supply | Qty: 90 | Fill #0

## 2017-09-02 MED FILL — LEVALBUTEROL HCL 1.25 MG/3M: 1.25 | 5 days supply | Qty: 90 | Fill #0

## 2017-09-02 MED FILL — MOMETASONE FUROATE 50 MCG S: 50 | 30 days supply | Qty: 17 | Fill #0

## 2017-09-02 MED FILL — LEVALBUTEROL TAR HFA 45MCG: 45 | 25 days supply | Qty: 15 | Fill #0

## 2017-09-02 NOTE — Progress Notes (Signed)
Subjective:     Patient ID: Marissa Horn, female   DOB: February 21, 1971, 47 y.o.   MRN: [id number]  HPI   Patient seen for physical exam. She has history of cough variant asthma, hyperglycemia, B12 deficiency, vitamin D deficiency, irritable bowel syndrome. She states that since she's gone on B12 replacement she's noticed much fewer symptoms with her IBS. She is a strict vegetarian and has had borderline low zinc levels in the past and requesting levels. Requesting refills of several medications. Generally feels good overall. Flu vaccine already given.  Past Medical History:  Diagnosis Date  . Allergy   . Anemia   . Arthritis    hands  . Asthma    ?, inhaler use as needed  . Complication of anesthesia 1990   muscle relaxant caused lung to collapse  . Difficult intubation 1990  . Dyslipidemia   . History of chicken pox   . IBS (irritable bowel syndrome)    ?  [name] Kitchen Migraine   . UTI (lower urinary tract infection)    Past Surgical History:  Procedure Laterality Date  . ABDOMINAL HYSTERECTOMY  05/2012  . ABDOMINOPLASTY  05/04/2012   Procedure: ABDOMINOPLASTY;  Surgeon: Crissie Reese, MD;  Location: [facility];  Service: Plastics;  Laterality: N/A;  . ANAL FISSURE REPAIR     age 44  . EYE SURGERY Left    age 57  . GYNECOLOGIC CRYOSURGERY    . WISDOM TOOTH EXTRACTION      reports that  has never smoked. she has never used smokeless tobacco. She reports that she does not drink alcohol or use drugs. family history includes Arthritis in her mother; Cataracts in her father and mother; Glaucoma in her mother; Heart disease (age of onset: 69) in her father; Hyperlipidemia in her brother, father, and mother; Irritable bowel syndrome in her mother; Thyroid disease in her sister; Ulcerative colitis in her brother. Allergies  Allergen Reactions  . Scopolamine Anaphylaxis  . Bactrim [Sulfamethoxazole-Trimethoprim]     Joint pain, fever  . Biaxin [Clarithromycin] Nausea And Vomiting    Projectile  vomiting  . Tamiflu [Oseltamivir Phosphate] Nausea And Vomiting    Projectile vomiting  . Cephalexin Nausea And Vomiting  . Sulfa Antibiotics     Unknown reaction  . Asa [Aspirin] Rash    Pt takes ibuprofen without problems     Review of Systems  Constitutional: Negative for activity change, appetite change, fatigue, fever and unexpected weight change.  HENT: Negative for ear pain, hearing loss, sore throat and trouble swallowing.   Eyes: Negative for visual disturbance.  Respiratory: Negative for cough and shortness of breath.   Cardiovascular: Negative for chest pain and palpitations.  Gastrointestinal: Negative for abdominal pain, blood in stool, constipation and diarrhea.  Genitourinary: Negative for dysuria and hematuria.  Musculoskeletal: Negative for arthralgias, back pain and myalgias.  Skin: Negative for rash.  Neurological: Negative for dizziness, syncope and headaches.  Hematological: Negative for adenopathy.  Psychiatric/Behavioral: Negative for confusion and dysphoric mood.       Objective:   Physical Exam  Constitutional: She is oriented to person, place, and time. She appears well-developed and well-nourished.  HENT:  Head: Normocephalic and atraumatic.  Eyes: EOM are normal. Pupils are equal, round, and reactive to light.  Neck: Normal range of motion. Neck supple. No thyromegaly present.  Cardiovascular: Normal rate, regular rhythm and normal heart sounds.  No murmur heard. Pulmonary/Chest: Breath sounds normal. No respiratory distress. She has no wheezes. She has no rales.  Abdominal: Soft. Bowel sounds are normal. She exhibits no distension and no mass. There is no tenderness. There is no rebound and no guarding.  Musculoskeletal: Normal range of motion. She exhibits no edema.  Lymphadenopathy:    She has no cervical adenopathy.  Neurological: She is alert and oriented to person, place, and time. She displays normal reflexes. No cranial nerve deficit.   Skin: No rash noted.  Psychiatric: She has a normal mood and affect. Her behavior is normal. Judgment and thought content normal.       Assessment:     Physical exam. Patient is a strict vegetarian. History of deficiencies as above of B12 and vitamin D. Following issues were addressed    Plan:     -obtain screening labs. Include A1c, B12 level, 25 hydroxy vitamin D, and zinc level -Refills of several regular medications given -Continue with yearly flu vaccine  Eulas Post MD [facility] at [facility]

## 2017-09-03 ENCOUNTER — Encounter: Payer: Self-pay | Admitting: Family Medicine

## 2017-09-04 ENCOUNTER — Encounter: Payer: Self-pay | Admitting: Family Medicine

## 2017-09-04 LAB — ZINC: ZINC: 60 ug/dL (ref 60–130)

## 2017-09-25 DIAGNOSIS — H5213 Myopia, bilateral: Secondary | ICD-10-CM | POA: Diagnosis not present

## 2017-09-25 DIAGNOSIS — H524 Presbyopia: Secondary | ICD-10-CM | POA: Diagnosis not present

## 2017-12-09 MED FILL — LEVOCETIRIZINE 5 MG TABLET: 5 | 90 days supply | Qty: 90 | Fill #1

## 2017-12-09 MED FILL — VIT D2 1.25 MG (50,000 UNIT: 1.25 MG | 84 days supply | Qty: 12 | Fill #0

## 2017-12-09 MED FILL — CYANOCOBALAMIN 1,000 MCG/ML: 1000 | 90 days supply | Qty: 5 | Fill #0

## 2018-01-13 ENCOUNTER — Encounter: Payer: Self-pay | Admitting: Family Medicine

## 2018-01-13 ENCOUNTER — Telehealth: Payer: Self-pay | Admitting: Obstetrics & Gynecology

## 2018-01-13 ENCOUNTER — Encounter: Payer: Self-pay | Admitting: Obstetrics & Gynecology

## 2018-01-13 NOTE — Telephone Encounter (Signed)
Left message to call back and ask for triage nurse -eh

## 2018-01-13 NOTE — Telephone Encounter (Signed)
Message   ----- Message from [place], Generic sent at 01/13/2018 2:19 PM EDT -----    Dear Dr. Sabra Heck,    [name] all is well with you?    I am dealing with frequent 'hot flashes' (I assume that's what they are?)...should I see you for this, and just let it pass?    Marissa Horn

## 2018-01-15 ENCOUNTER — Encounter: Payer: Self-pay | Admitting: Family Medicine

## 2018-01-15 ENCOUNTER — Ambulatory Visit: Payer: 59 | Admitting: Family Medicine

## 2018-01-15 VITALS — BP 110/80 | HR 72 | Temp 98.3°F | Wt 156.2 lb

## 2018-01-15 DIAGNOSIS — M7751 Other enthesopathy of right foot: Secondary | ICD-10-CM | POA: Diagnosis not present

## 2018-01-15 DIAGNOSIS — R419 Unspecified symptoms and signs involving cognitive functions and awareness: Secondary | ICD-10-CM | POA: Diagnosis not present

## 2018-01-15 DIAGNOSIS — M79672 Pain in left foot: Secondary | ICD-10-CM | POA: Diagnosis not present

## 2018-01-15 NOTE — Progress Notes (Signed)
Subjective:     Patient ID: Marissa Horn, female   DOB: February 02, 1971, 47 y.o.   MRN: [id number]  HPI Patient seen for multiple issues as follows  Left foot pain. Duration uncertain. Denies any injury. Pain is mostly around the left fourth metatarsal region but pain is inconsistent. She has periods during the middle day where she has no pain but mostly early morning and late in the day. No swelling. No change of shoe wear. No history of stress fracture  Second issue is right heel pain. This is in the retrocalcaneal bursa region. No change of shoes. No visible swelling. No Achilles tendon pain. No plantar fascial pain.  Patient has concerns about inability with focus and inability to carry out tasks- especially multitasking. She states that about 3  years ago very heavy object fell on her head. She had laceration which required 10 sutures. MRI scan at that point showed only a small incidental venous anomaly but no other abnormalities.  She thinks symptoms worsened after then.  She is not certain that she has had attention issues her entire life and she thinks there has been worsening over the past few years. She has had a few occasions where she has had difficulty with word finding. She feels her short-term memory has been impaired as well. She had recent TSH which was normal. She does have history of B12 deficiency but had recent level over 300 and does take supplement  Past Medical History:  Diagnosis Date  . Allergy   . Anemia   . Arthritis    hands  . Asthma    ?, inhaler use as needed  . Complication of anesthesia 1990   muscle relaxant caused lung to collapse  . Difficult intubation 1990  . Dyslipidemia   . History of chicken pox   . IBS (irritable bowel syndrome)    ?  [name] Kitchen Migraine   . UTI (lower urinary tract infection)    Past Surgical History:  Procedure Laterality Date  . ABDOMINAL HYSTERECTOMY  05/2012  . ABDOMINOPLASTY  05/04/2012   Procedure: ABDOMINOPLASTY;  Surgeon:  Crissie Reese, MD;  Location: [facility];  Service: Plastics;  Laterality: N/A;  . ANAL FISSURE REPAIR     age 87  . EYE SURGERY Left    age 78  . GYNECOLOGIC CRYOSURGERY    . WISDOM TOOTH EXTRACTION      reports that she has never smoked. She has never used smokeless tobacco. She reports that she does not drink alcohol or use drugs. family history includes Arthritis in her mother; Cataracts in her father and mother; Glaucoma in her mother; Heart disease (age of onset: 54) in her father; Hyperlipidemia in her brother, father, and mother; Irritable bowel syndrome in her mother; Thyroid disease in her sister; Ulcerative colitis in her brother. Allergies  Allergen Reactions  . Scopolamine Anaphylaxis  . Bactrim [Sulfamethoxazole-Trimethoprim]     Joint pain, fever  . Biaxin [Clarithromycin] Nausea And Vomiting    Projectile vomiting  . Tamiflu [Oseltamivir Phosphate] Nausea And Vomiting    Projectile vomiting  . Cephalexin Nausea And Vomiting  . Sulfa Antibiotics     Unknown reaction  . Asa [Aspirin] Rash    Pt takes ibuprofen without problems     Review of Systems  Constitutional: Negative for fatigue.  Eyes: Negative for visual disturbance.  Respiratory: Negative for cough, chest tightness, shortness of breath and wheezing.   Cardiovascular: Negative for chest pain, palpitations and leg swelling.  Neurological: Negative  for dizziness, seizures, syncope, weakness, light-headedness and headaches.       Objective:   Physical Exam  Constitutional: She appears well-developed and well-nourished.  Eyes: Pupils are equal, round, and reactive to light.  Neck: Neck supple. No JVD present. No thyromegaly present.  Cardiovascular: Normal rate and regular rhythm. Exam reveals no gallop.  Pulmonary/Chest: Effort normal and breath sounds normal. No respiratory distress. She has no wheezes. She has no rales.  Musculoskeletal: She exhibits no edema.  Right left feet reveal no swelling. She has  some tenderness over the right retrocalcaneal bursa region. No Achilles tenderness. Full ROM motion with flexion dorsiflexion  Left foot no reproducible tenderness at this time. No visible swelling. No warmth. No erythema  Neurological: She is alert. No cranial nerve deficit.  Psychiatric: She has a normal mood and affect. Her behavior is normal.       Assessment:     #1 probable right retrocalcaneal bursitis  #2 left foot pain. This appears more metatarsal. Question tendinitis as her pain is somewhat inconsistent. Less likely stress fracture  #3 patient has questions for some recent cognitive changes- mostly inability to focus, possibly worsening after blunt head injury a few years ago.    Plan:     -Recommend icing right foot 2-3 times daily 15-20 minutes -Consider topical diclofenac gel if not improving in a few weeks. -Also recommended some icing left foot. Consider x-rays if not improved in 2 weeks -Set up referral for further neuropsychological testing  Eulas Post MD [facility]

## 2018-01-15 NOTE — Patient Instructions (Signed)
Try icing left and right foot 15-20 minutes 2-3 times daily  If foot not better in 2-3 weeks let me know and we can get some x-rays  We will set up neuropsychology referral.

## 2018-01-19 ENCOUNTER — Ambulatory Visit: Payer: 59 | Admitting: Psychology

## 2018-01-19 DIAGNOSIS — R413 Other amnesia: Secondary | ICD-10-CM

## 2018-01-19 DIAGNOSIS — S060X0S Concussion without loss of consciousness, sequela: Secondary | ICD-10-CM | POA: Diagnosis not present

## 2018-01-19 DIAGNOSIS — R41840 Attention and concentration deficit: Secondary | ICD-10-CM

## 2018-01-19 NOTE — Progress Notes (Signed)
NEUROBEHAVIORAL STATUS EXAM   Name: Marissa Horn Date of Birth: 1971-06-23 Date of Interview: 01/19/2018  Reason for Referral:  Marissa Horn is a 47 y.o. female who is referred for neuropsychological evaluation by Dr. Carolann Littler of [facility] due to concerns about cognitive changes. This patient is unaccompanied in the office for today's appointment.  History of Presenting Problem:  Ms. Scherer reported that almost 3 years ago she experienced blunt force trauma to the top of her head when a large, heavy beam/valance fell on the top of her head. She did not lose consciousness. She had a laceration which required 10 sutures. MRI brain at that point showed nothing acute. 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 Over time since then she believes her cognitive abilities have been declining. She was concerned about cognitive symptoms a few months after the head trauma and spoke to her friend who is a neurologist, who assured her that the symptoms were normal, probably related to concussion, and would get better over time. However, the patient feels symptoms have gotten worse instead of better, particularly in the past 6 months. Symptoms of concern to her include: word finding difficulty, inability to think in a fluent manner, forgetfulness, mental fatigue and overwhelm, inability to multi-task (which she feels she was very good at in the past), zoning out, difficulty paying attention in conversation, and lowered frustration tolerance.   In addition, she does note a lifelong history of attention difficulties. She wonders if the possible concussion she sustained in 2016 exacerbated underlying ADD. As a child, she had difficulty concentrating in class. She was always talking to other people instead of listening, and she would never finish her homework. If a class or topic was of interest to her, she would excel, but she did  poorly with everything else. As a result she had very high grades and very low grades depending on the subject or teacher. She had a hard time reading books because of her shortened attention span. In college, she required an extra year to complete her program. She was then in a master's degree program but never completed the final internship so never got her master's degree. She worked as a Water quality scientist. She loved the hands-on part of her work and working directly with people, but it took her much longer to do charting than it should have. She was never formally evaluated for or diagnosed with ADD. However, her son has been diagnosed with ADHD, her brother has ADD ("but also other issues"), and she thinks her mother probably has undiagnosed ADD.  The patient is not employed but stays quite busy serving on several committees and boards, and caring for her children. She is busy but notes that she was able to do even more in the past. She manages all instrumental ADLs, including driving (denies any problems with this), medications (does not have any she has to take on a daily basis), appointments (she notes she has to write everything down now and she relies on her planner and sets reminder alarms), and cooking (inattention and difficulty dividing attention has been causing problems of late).   She denies any psychiatric history. She has never been treated for any mental health condition. She denies any history of suicidal ideation or intention. She reported she is typically a happy-go-lucky person, never one to be depressed. As indicated previously, she is feeling more frustrated and irritable lately, which she attributes  to the change in her cognitive functioning. Her family notices she is more quick to "lose my cool" which was very atypical of her in the past. She denies any sleep difficulty. She has not had any change in appetite or weight. She denies any significant psychosocial stress at the present  time.   Physically, she complains of some aches and pains in her feet which she is addressing with Dr. Elease Hashimoto. She reports she has a very high pain tolerance, though. She has a history of migraines (typically associated with heat and occurring more often in the summer) but this has not been a major problem of late. She has no other history of head trauma aside from the blunt force trauma with possible concussion in 2016. Family medical history is significant for ADD (as discussed earlier) and Parkinson's disease (2 uncles). There is no family history of dementia.   Social History: Born/Raised: [place] First language is [place]. Also was exposed to colloquial English since birth.  Education: Just short of master's degree (didn't finish internship program) Occupational history: Previously a Water quality scientist. Stopped working after 2nd child, about 10 years ago Marital history: Married x16 years, with 2 sons, ages 20 and 49 Alcohol: None at all Tobacco: Never SA: never   Medical History: Past Medical History:  Diagnosis Date  . Allergy   . Anemia   . Arthritis    hands  . Asthma    ?, inhaler use as needed  . Complication of anesthesia 1990   muscle relaxant caused lung to collapse  . Difficult intubation 1990  . Dyslipidemia   . History of chicken pox   . IBS (irritable bowel syndrome)    ?  [name] Kitchen Migraine   . UTI (lower urinary tract infection)      Current Medications:  Outpatient Encounter Medications as of 01/19/2018  Medication Sig  . budesonide-formoterol (SYMBICORT) 80-4.5 MCG/ACT inhaler INHALE 2 PUFFS BY MOUTH INTO THE LUNGS 2 TIMES DAILY.  . cyanocobalamin (,VITAMIN B-12,) 1000 MCG/ML injection Take B12 1,000 mcg IM once every 2 weeks for 2 months, then once monthly.  . ergocalciferol (VITAMIN D2) 50000 units capsule Take 1 capsule (50,000 Units total) by mouth once a week.  [name] Kitchen ketotifen (ZADITOR) 0.025 % ophthalmic solution  Place 1 drop into both eyes 2 (two) times daily.  [name] Kitchen levalbuterol (XOPENEX HFA) 45 MCG/ACT inhaler Inhale 1-2 puffs into the lungs every 6 (six) hours as needed for wheezing.  . levalbuterol (XOPENEX) 1.25 MG/3ML nebulizer solution Take 1.25 mg by nebulization every 4 (four) hours as needed for wheezing.  [name] Kitchen levocetirizine (XYZAL) 5 MG tablet Take 1 tablet (5 mg total) by mouth daily as needed.  . mometasone (ELOCON) 0.1 % cream Apply 1 application topically daily. (Patient taking differently: Apply 1 application topically daily as needed. )  . mometasone (NASONEX) 50 MCG/ACT nasal spray Place 2 sprays into the nose daily.  [name] Kitchen NEEDLE, DISP, 25 G (BD ECLIPSE NEEDLE) 25G X 5/8" MISC Use as directed.  . ranitidine (ZANTAC) 150 MG capsule Take 150 mg by mouth 2 (two) times daily.    No facility-administered encounter medications on file as of 01/19/2018.      Behavioral Observations:   Appearance: Neatly and appropriately dressed and groomed Gait: Ambulated independently, no gross abnormalities observed Speech: Fluent; mildly increased rate, normal rhythm and mildly to moderately increased volume. Mild to moderate word finding difficulty. Thought process: Generally lineaer Affect: Full, anxious Interpersonal: Very pleasant, appropriate  60 minutes spent face-to-face with patient completing neurobehavioral status exam. 45 minutes spent integrating medical records/clinical data and completing this report. [id number] unit; [id number] unit.   TESTING: There is medical necessity to proceed with neuropsychological assessment as the results will be used to aid in differential diagnosis and clinical decision-making and to inform specific treatment recommendations. Per the patient and medical records reviewed, there has been a change in cognitive functioning and a reasonable suspicion of postconcussion syndrome superimposed on previously undiagnosed ADHD.  Clinical Decision Making: In considering the  patient's current level of functioning, level of presumed impairment, nature of symptoms, emotional and behavioral responses during the interview, level of literacy, and observed level of motivation, a battery of tests was selected and communicated to the psychometrician.    PLAN: The patient will return to complete the above referenced full battery of neuropsychological testing with a psychometrician under my supervision. Education regarding testing procedures was provided to the patient. Subsequently, the patient will see this provider for a follow-up session at which time her test performances and my impressions and treatment recommendations will be reviewed in detail.  Evaluation ongoing; full report to follow.

## 2018-01-20 ENCOUNTER — Encounter: Payer: Self-pay | Admitting: Psychology

## 2018-01-21 NOTE — Telephone Encounter (Signed)
Left message to return call [facility] at [phone]. MyChart message sent as well requesting a return call to discuss and schedule an OV.

## 2018-01-22 ENCOUNTER — Telehealth: Payer: Self-pay | Admitting: Obstetrics & Gynecology

## 2018-01-22 NOTE — Telephone Encounter (Signed)
Patient sent the following message through [facility]. Routing to triage to assist patient with request.   ----- Message from [place], Generic sent at 01/22/2018 12:01 AM EDT -----    Dear Marissa Horn,    I did get your message! I am so sorry for not being able to call back during working hours. Glad you emailed me!   I should be able to speak tomorrow AM? Will that work for you?    Sincerely,    Araina  [phone]    ----- Message -----  From: Nurse Naaman Plummer  Sent: 01/21/18, 9:51 AM  To: Marissa Horn  Subject: Hot Flashes    Marissa Horn,    Our office has attempted to reach you via telephone to discuss your symptoms further. Please contact the office at your convenience to discuss and schedule an office visit.    Thank you,    Reesa Chew, RN

## 2018-01-22 NOTE — Telephone Encounter (Signed)
Called patient and she is complaining of hot flashes. She states she is "on fire" inside and out multiple times of the day and assumes this is hot flashes. More frequently at night. Advised patient she would need appointment to discuss with Dr.Miller for recommendations. Has AEX 03-26-18 but would like to come discuss hot flashes sometime in July. Appointment for AEX and discuss hot flashes made for 02-12-18. Advised patient she could try [place] until appointment for hot flashes.

## 2018-01-27 ENCOUNTER — Ambulatory Visit: Payer: 59 | Admitting: Psychology

## 2018-01-27 ENCOUNTER — Encounter: Payer: Self-pay | Admitting: Psychology

## 2018-01-27 DIAGNOSIS — S060X0S Concussion without loss of consciousness, sequela: Secondary | ICD-10-CM

## 2018-01-27 NOTE — Progress Notes (Signed)
   Neuropsychology Note  Feleshia S Smither completed 180 minutes of neuropsychological testing with technician, Milana Kidney, BS, under the supervision of Dr. Macarthur Critchley, Licensed Psychologist. The patient did not appear overtly distressed by the testing session, per behavioral observation or via self-report to the technician. Rest breaks were offered.   Clinical Decision Making: In considering the patient's current level of functioning, level of presumed impairment, nature of symptoms, emotional and behavioral responses during the interview, level of literacy, and observed level of motivation/effort, a battery of tests was selected and communicated to the psychometrician.  Communication between the psychologist and technician was ongoing throughout the testing session and changes were made as deemed necessary based on patient performance on testing, technician observations and additional pertinent factors such as those listed above.  Marissa Horn will return within approximately 2 weeks for an interactive feedback session with Dr. Si Raider at which time her test performances, clinical impressions and treatment recommendations will be reviewed in detail. The patient understands she can contact our office should she require our assistance before this time.  35 minutes spent performing neuropsychological evaluation services/clinical decision making (psychologist). [CPT 82641] 180 minutes spent face-to-face with patient administering standardized tests, 60 minutes spent scoring (technician). [CPT [id number], 58309]  Full report to follow.

## 2018-02-09 NOTE — Progress Notes (Signed)
NEUROPSYCHOLOGICAL EVALUATION   Name:    Marissa Horn  Date of Birth:   1971/03/24 Date of Interview:  01/19/2018 Date of Testing:  01/27/2018   Date of Feedback:  02/11/2018       Background Information:  Reason for Referral:  Marissa Horn is a 47 y.o. female referred by Dr. Carolann Littler of [facility] to assess her current level of cognitive functioning and assist in differential diagnosis. The current evaluation consisted of a review of available medical records, an interview with the patient, and the completion of a neuropsychological testing battery. Informed consent was obtained.  History of Presenting Problem:  Marissa Horn reported that almost 3 years ago she experienced blunt force trauma to the top of her head when a large, heavy beam/valance fell on the top of her head. She did not lose consciousness. She had a laceration which required 10 sutures. MRI brain at that point showed nothing acute. 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 Over time since then she believes her cognitive abilities have been declining. She was concerned about cognitive symptoms a few months after the head trauma and spoke to her friend who is a neurologist, who assured her that the symptoms were normal, probably related to concussion, and would get better over time. However, the patient feels symptoms have gotten worse instead of better, particularly in the past 6 months. Symptoms of concern to her include: word finding difficulty, inability to think in a fluent manner, forgetfulness, mental fatigue and overwhelm, inability to multi-task (which she feels she was very good at in the past), zoning out, difficulty paying attention in conversation, and lowered frustration tolerance.   In addition, she does note a lifelong history of attention difficulties. She wonders if the possible concussion she sustained in 2016 exacerbated underlying ADD. As a  child, she had difficulty concentrating in class. She was always talking to other people instead of listening, and she would never finish her homework. If a class or topic was of interest to her, she would excel, but she did poorly with everything else. As a result she had very high grades and very low grades depending on the subject or teacher. She had a hard time reading books because of her shortened attention span. In college, she required an extra year to complete her program. She was then in a master's degree program but never completed the final internship so never got her master's degree. She worked as a Water quality scientist. She loved the hands-on part of her work and working directly with people, but it took her much longer to do charting than it should have. She was never formally evaluated for or diagnosed with ADD. However, her son has been diagnosed with ADHD, her brother has ADD ("but also other issues"), and she thinks her mother probably has undiagnosed ADD.  The patient is not employed but stays quite busy serving on several committees and boards, and caring for her children. She is busy but notes that she was able to do even more in the past. She manages all instrumental ADLs, including driving (denies any problems with this), medications (does not have any she has to take on a daily basis), appointments (she notes she has to write everything down now and she relies on her planner and sets reminder alarms), and cooking (inattention and difficulty dividing attention has been causing problems of late).   She denies any psychiatric history. She has  never been treated for any mental health condition. She denies any history of suicidal ideation or intention. She reported she is typically a happy-go-lucky person, never one to be depressed. As indicated previously, she is feeling more frustrated and irritable lately, which she attributes to the change in her cognitive functioning. Her family notices  she is more quick to "lose my cool" which was very atypical of her in the past. She denies any sleep difficulty. She has not had any change in appetite or weight. She denies any significant psychosocial stress at the present time.   Physically, she complains of some aches and pains in her feet which she is addressing with Dr. Elease Hashimoto. She reports she has a very high pain tolerance, though. She has a history of migraines (typically associated with heat and occurring more often in the summer) but this has not been a major problem of late. She has no other history of head trauma aside from the blunt force trauma with possible concussion in 2016. Family medical history is significant for ADD (as discussed earlier) and Parkinson's disease (2 uncles). There is no family history of dementia.   Social History: Born/Raised: [place] First language is [place]. Also was exposed to colloquial English since birth.  Education: Just short of master's degree (didn't finish internship program) Occupational history: Previously a Water quality scientist. Stopped working after 2nd child, about 10 years ago Marital history: Married x16 years, with 2 sons, ages 69 and 21 Alcohol: None at all Tobacco: Never SA: never   Medical History:  Past Medical History:  Diagnosis Date  . Allergy   . Anemia   . Arthritis    hands  . Asthma    ?, inhaler use as needed  . Complication of anesthesia 1990   muscle relaxant caused lung to collapse  . Difficult intubation 1990  . Dyslipidemia   . History of chicken pox   . IBS (irritable bowel syndrome)    ?  [name] Kitchen Migraine   . UTI (lower urinary tract infection)     Current medications:  Outpatient Encounter Medications as of 02/11/2018  Medication Sig  . budesonide-formoterol (SYMBICORT) 80-4.5 MCG/ACT inhaler INHALE 2 PUFFS BY MOUTH INTO THE LUNGS 2 TIMES DAILY.  . cyanocobalamin (,VITAMIN B-12,) 1000 MCG/ML injection Take B12  1,000 mcg IM once every 2 weeks for 2 months, then once monthly.  . ergocalciferol (VITAMIN D2) 50000 units capsule Take 1 capsule (50,000 Units total) by mouth once a week.  [name] Kitchen ketotifen (ZADITOR) 0.025 % ophthalmic solution Place 1 drop into both eyes 2 (two) times daily.  [name] Kitchen levalbuterol (XOPENEX HFA) 45 MCG/ACT inhaler Inhale 1-2 puffs into the lungs every 6 (six) hours as needed for wheezing.  . levalbuterol (XOPENEX) 1.25 MG/3ML nebulizer solution Take 1.25 mg by nebulization every 4 (four) hours as needed for wheezing.  [name] Kitchen levocetirizine (XYZAL) 5 MG tablet Take 1 tablet (5 mg total) by mouth daily as needed.  . mometasone (ELOCON) 0.1 % cream Apply 1 application topically daily. (Patient taking differently: Apply 1 application topically daily as needed. )  . mometasone (NASONEX) 50 MCG/ACT nasal spray Place 2 sprays into the nose daily.  [name] Kitchen NEEDLE, DISP, 25 G (BD ECLIPSE NEEDLE) 25G X 5/8" MISC Use as directed.  . ranitidine (ZANTAC) 150 MG capsule Take 150 mg by mouth 2 (two) times daily.    No facility-administered encounter medications on file as of 02/11/2018.      Current Examination:  Behavioral  Observations:  Appearance: Neatly and appropriately dressed and groomed Gait: Ambulated independently, no gross abnormalities observed Speech: Fluent; increased rate, increased volume. Mild to moderate word finding difficulty. Thought process: Generally lineaer Affect: Full, anxious Interpersonal: Very pleasant, appropriate Orientation: Oriented to all spheres. Accurately named the current President and his predecessor.   Tests Administered: . Test of Premorbid Functioning (TOPF) . Wechsler Adult Intelligence Scale-Fourth Edition (WAIS-IV): Similarities, Information, Block Design, Matrix Reasoning, Arithmetic, Symbol Search, Coding and Digit Span subtests . Wechsler Memory Scale-Fourth Edition (WMS-IV) Adult Version (ages 69-69): Logical Memory I, II and Recognition subtests   . [place] Verbal Learning Test - 2nd Edition (CVLT-2) Standard Form . Conners Continuous Performance Test 3rd Edition (CPT3) . Repeatable Battery for the Assessment of Neuropsychological Status (RBANS) Form A:  Figure Copy and Figure Recall subtests and Semantic Fluency subtest . Controlled Oral Word Association Test (COWAT) . Trail Making Test A and B . [place] (BNT) . Wender [place] Rating Scale [facility]) . Personality Assessment Inventory (PAI)  Test Results: Note: Standardized scores are presented only for use by appropriately trained professionals and to allow for any future test-retest comparison. These scores should not be interpreted without consideration of all the information that is contained in the rest of the report. The most recent standardization samples from the test publisher or other sources were used whenever possible to derive standard scores; scores were corrected for age, gender, ethnicity and education when available.   Test Scores:  Test Name Raw Score Standardized Score Descriptor  TOPF 42/70 SS= 100 Average  WAIS-IV Subtests     Similarities 25/36 ss= 10 Average  Information 11/26 ss= 8 Average  Block Design 38/66 ss= 10 Average  Matrix Reasoning 19/26 ss= 11 Average  Arithmetic 17/22 ss= 12 High average  Symbol Search 30/60 ss= 10 Average  Coding 64/135 ss= 10 Average  Digit Span 33/48 ss= 13 High average  WAIS-IV Index Scores     Verbal Comprehension  SS= 95 Average  Perceptual Reasoning  SS= 104 Average  Working Memory  SS= 114 High average  Processing Speed  SS= 100 Average  Full Scale IQ (8 subtest)  SS= 103 Average   WMS-IV Subtests     LM I 20/50 ss= 8 Average  LM II 16/50 ss= 8 Average  LM II Recognition 24/30 Cum %: 26-50 WNL  CVLT-II Scores     Trial 1 4/16 Z= -1.5 Borderline  Trial 5 10/16 Z= -1 Low average  Trials 1-5 total 33/80 T= 32 Borderline  SD Free Recall 3/16 Z= -2.5 Impaired  SD Cued Recall 6/16 Z= -2.5 Impaired  LD  Free Recall 8/16 Z= -1.5 Borderline   LD Cued Recall 7/16 Z= -2 Impaired  Recognition Hits 16/16 Z= 0.5 Average  Recognition False Positives 10 Z= -2 Impaired  Forced Choice Recognition 16/16  WNL  CPT3     Detectability  T= 55 High average  Omissions  T= 45 Average  Commissions  T= 61 Elevated  Perseverations  T= 46 Average  RBANS Subtests     Figure Copy 20/20 Z= 1.2 High average  Figure Recall 12/20 Z= -0.5 Average  Semantic Fluency 20 Z= -0.2 Average  COWAT-FAS 39 T= 45 Average  COWAT-Animals 21 T= 48 Average  BNT 51/60 T= 41 Low average  Trail Making Test A  36" 0 errors T= 44 Average  Trail Making Test B  77" 0 errors T= 43 Average  WURS 26/100  Below cutoff  PAI  Only elevated  clinical scales are shown here:   ARD  T= 77   MAN  T= 80   SCZ  T= 89      Description of Test Results:  Embedded performance validity indicators were within normal limits. As such, the patient's current performance on neurocognitive testing is judged to be a relatively accurate representation of her current level of neurocognitive functioning.   Premorbid verbal intellectual abilities were estimated to have been within the average range based on a test of word reading. Consistent with this, her current full scale IQ fell within the average range.  Psychomotor processing speed was average.   Auditory attention and working memory were high average. Meanwhile, on a task of sustained visual attention, she committed increased commission errors and demonstrated an impulsive response style.   Visual-spatial construction was average.   With regard to language, semantic retrieval abilities were within normal limits. Specifically, confrontation naming was low average, and semantic verbal fluency was average.   With regard to verbal memory, encoding and acquisition of non-contextual information (i.e., word list) was borderline impaired across five learning trials. After a brief interference task, free  recall was impaired (3/16 items recalled). She was able to recall an additional three items with semantic cueing, but her score remained within the impaired range for cued recall. After a delay, free recall was borderline but improved from immediate recall (8/16 items). Cued recall was impaired. Performance on a yes/no recognition task was impaired due to a high number of false positive errors. On another verbal memory test, encoding and acquisition of contextual auditory information (i.e., short stories) was average. After a delay, free recall was average. Performance on a yes/no recognition task was within normal limits. With regard to non-verbal memory, delayed free recall of visual information was average.   On tests measuring various aspects of executive functioning, performances were within normal limits. Mental flexibility and set-shifting were average on Trails B. Verbal fluency with phonemic search restrictions was average. Verbal abstract reasoning was average. Non-verbal abstract reasoning was average.   On an extensive self-report measure assessing for the presence of psychopathology and/or personality disorder (PAI), the patient produced a valid profile. The configuration of the clinical scales suggests a person with significant thinking and concentration problems (marked by confusion and distractibility), accompanied by heightened activity levels and irritable and expansive mood.  The patient describes significant problems frequently associated with aspects of a manic episode at a level of severity that is uncommon even in clinical samples.  She is probably quite impulsive and unusually energetic.  It appears that her clinical picture is characterized primarily by heightened energy levels and irritability.  She is likely to have an activity level that is perceptibly high to most observers.  She is probably involved in these activities in an overcommitted and disorganized manner, and she may  experience her thought processes as being accelerated.  However, grandiose thoughts and plans do not appear to be a strong feature of the clinical picture at this time.  The patient indicates that she is experiencing specific fears or anxiety surrounding some situations.  The pattern of responses reveals that she is likely to display significant symptoms related to traumatic stress.  She has likely experienced a disturbing traumatic event in the past-an event that continues to distress her and produce recurrent episodes of anxiety.  Whereas the item content of the PAI does not address specific causes of traumatic stress, possible traumatic events involve victimization (e.g., rape, abuse), combat experiences, life-threatening accidents,  and natural disasters. The patient mentions that she is experiencing some degree of anxiety and stress; this degree of worry and sensitivity is still within what would be considered the normal range. It appears that the patient has a history of involvement in intense and volatile relationships.  In these relationships, she tends to be preoccupied with fears of being abandoned or rejected by those people important to her. The patient reports some difficulties consistent with relatively mild or transient depressive symptomatology.   The patient indicates some concerns about physical functioning and health matters in general.   According to the patient's self-report, she describes NO significant problems in the following areas: antisocial behavior; problems with empathy; undue suspiciousness or hostility.  Also, she reports NO significant problems with alcohol or drug abuse or dependence.  With respect to suicidal ideation, the patient is NOT reporting distress from thoughts of self-harm.   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  Results of cognitive testing demonstrated focal deficits in sustained attention  and in encoding/retrieval of non-contextual information (with intact memory for contextual information). Otherwise, cognitive testing was within normal limits. The patient's cognitive profile on objective testing, alongside history of lifelong attention difficulties and current reported symptoms, is highly indicative of ADHD. She is also reporting some symptoms of anxiety which may represent subclinical generalized anxiety but on the PAI her responses also suggested a possibility of posttraumatic stress. I do not see any signs concerning for any neurocognitive disorder other than ADHD, and I do not see any signs of neurodegenerative disorder at this time. I also doubt that concussion is a contributing factor at this point, but it is possible that it could have exacerbated underlying ADHD initially, and may have also caused her to be more vigilant of cognitive challenges.    Recommendations/Plan: Based on the findings of the present evaluation, the following recommendations are offered:  1. I highly recommend cognitive behavioral treatment for ADHD with an ADHD specialist; the patient is amenable to this and I will place a referral to Dr. Royetta Crochet of [facility] on her behalf.  2. A trial of stimulant medication could also be considered for treatment of ADHD, although this would need to be closely monitored as it can increase anxiety in some patients.    Feedback to Patient: Marissa Horn and her husband returned for a feedback appointment on 02/11/2018 to review the results of her neuropsychological evaluation with this provider. 30 minutes face-to-face time was spent reviewing her test results, my impressions and my recommendations as detailed above.    Total time spent on this patient's case: 105 minutes for neurobehavioral status exam with psychologist (CPT code [phone], [phone] unit); 240 minutes of testing/scoring by psychometrician under psychologist's supervision (CPT codes  [phone], [phone] units); 240 minutes for integration of patient data, interpretation of standardized test results and clinical data, clinical decision making, treatment planning and preparation of this report, and interactive feedback with review of results to the patient/family by psychologist (CPT codes [phone], [phone] units).      Thank you for your referral of Marissa Horn. Please feel free to contact me if you have any questions or concerns regarding this report.

## 2018-02-11 ENCOUNTER — Encounter: Payer: Self-pay | Admitting: Psychology

## 2018-02-11 ENCOUNTER — Ambulatory Visit: Payer: 59 | Admitting: Psychology

## 2018-02-11 ENCOUNTER — Encounter

## 2018-02-11 ENCOUNTER — Encounter: Payer: Self-pay | Admitting: Family Medicine

## 2018-02-11 DIAGNOSIS — S060X0S Concussion without loss of consciousness, sequela: Secondary | ICD-10-CM

## 2018-02-11 DIAGNOSIS — F902 Attention-deficit hyperactivity disorder, combined type: Secondary | ICD-10-CM

## 2018-02-11 NOTE — Patient Instructions (Signed)
Description of Test Results:  Embedded performance validity indicators were within normal limits. As such, the patient's current performance on neurocognitive testing is judged to be a relatively accurate representation of her current level of neurocognitive functioning.   Premorbid verbal intellectual abilities were estimated to have been within the average range based on a test of word reading. Consistent with this, her current full scale IQ fell within the average range.  Psychomotor processing speed was average.   Auditory attention and working memory were high average. Meanwhile, on a task of sustained visual attention, she committed increased commission errors and demonstrated an impulsive response style.   Visual-spatial construction was average.   With regard to language, semantic retrieval abilities were within normal limits. Specifically, confrontation naming was low average, and semantic verbal fluency was average.   With regard to verbal memory, encoding and acquisition of non-contextual information (i.e., word list) was borderline impaired across five learning trials. After a brief interference task, free recall was impaired (3/16 items recalled). She was able to recall an additional three items with semantic cueing, but her score remained within the impaired range for cued recall. After a delay, free recall was borderline but improved from immediate recall (8/16 items). Cued recall was impaired. Performance on a yes/no recognition task was impaired due to a high number of false positive errors. On another verbal memory test, encoding and acquisition of contextual auditory information (i.e., short stories) was average. After a delay, free recall was average. Performance on a yes/no recognition task was within normal limits. With regard to non-verbal memory, delayed free recall of visual information was average.   On tests measuring various aspects of executive functioning, performances  were within normal limits. Mental flexibility and set-shifting were average on Trails B. Verbal fluency with phonemic search restrictions was average. Verbal abstract reasoning was average. Non-verbal abstract reasoning was average.   On an extensive self-report measure assessing for the presence of psychopathology and/or personality disorder (PAI), the patient produced a valid profile. The configuration of the clinical scales suggests a person with significant thinking and concentration problems (marked by confusion and distractibility), accompanied by heightened activity levels and irritable and expansive mood.  The patient describes significant problems frequently associated with aspects of a manic episode at a level of severity that is uncommon even in clinical samples.  She is probably quite impulsive and unusually energetic.  It appears that her clinical picture is characterized primarily by heightened energy levels and irritability.  She is likely to have an activity level that is perceptibly high to most observers.  She is probably involved in these activities in an overcommitted and disorganized manner, and she may experience her thought processes as being accelerated.  However, grandiose thoughts and plans do not appear to be a strong feature of the clinical picture at this time.  The patient indicates that she is experiencing specific fears or anxiety surrounding some situations.  The pattern of responses reveals that she is likely to display significant symptoms related to traumatic stress.  She has likely experienced a disturbing traumatic event in the past-an event that continues to distress her and produce recurrent episodes of anxiety.  Whereas the item content of the PAI does not address specific causes of traumatic stress, possible traumatic events involve victimization (e.g., rape, abuse), combat experiences, life-threatening accidents, and natural disasters. The patient mentions that she is  experiencing some degree of anxiety and stress; this degree of worry and sensitivity is still within what would be  considered the normal range. It appears that the patient has a history of involvement in intense and volatile relationships.  In these relationships, she tends to be preoccupied with fears of being abandoned or rejected by those people important to her. The patient reports some difficulties consistent with relatively mild or transient depressive symptomatology.   The patient indicates some concerns about physical functioning and health matters in general.   According to the patient's self-report, she describes NO significant problems in the following areas: antisocial behavior; problems with empathy; undue suspiciousness or hostility.  Also, she reports NO significant problems with alcohol or drug abuse or dependence.  With respect to suicidal ideation, the patient is NOT reporting distress from thoughts of self-harm.   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  Results of cognitive testing demonstrated focal deficits in sustained attention and in encoding/retrieval of non-contextual information (with intact memory for contextual information). Otherwise, cognitive testing was within normal limits. The patient's cognitive profile on objective testing, alongside history of lifelong attention difficulties and current reported symptoms, is highly indicative of ADHD. She is also reporting some symptoms of anxiety which may represent subclinical generalized anxiety but on the PAI her responses also suggested a possibility of posttraumatic stress. I do not see any signs concerning for any neurocognitive disorder other than ADHD, and I do not see any signs of neurodegenerative disorder at this time. I also doubt that concussion is a contributing factor at this point, but it is possible that it could have exacerbated underlying ADHD  initially, and may have also caused her to be more vigilant of cognitive challenges.    Recommendations/Plan: Based on the findings of the present evaluation, the following recommendations are offered:  1. I highly recommend cognitive behavioral treatment for ADHD with an ADHD specialist; I can refer the patient to Dr. Royetta Crochet, who could also further evaluate anxiety.  2. A trial of stimulant medication could also be considered for treatment of ADHD, although this would need to be closely monitored as it can increase anxiety in some patients with underlying anxiety disorder.

## 2018-02-12 ENCOUNTER — Encounter: Payer: Self-pay | Admitting: Obstetrics & Gynecology

## 2018-02-12 ENCOUNTER — Ambulatory Visit (INDEPENDENT_AMBULATORY_CARE_PROVIDER_SITE_OTHER): Payer: 59 | Admitting: Obstetrics & Gynecology

## 2018-02-12 VITALS — BP 110/90 | HR 84 | Resp 16 | Ht 60.5 in | Wt 156.8 lb

## 2018-02-12 DIAGNOSIS — Z01419 Encounter for gynecological examination (general) (routine) without abnormal findings: Secondary | ICD-10-CM

## 2018-02-12 DIAGNOSIS — Z1211 Encounter for screening for malignant neoplasm of colon: Secondary | ICD-10-CM

## 2018-02-12 MED ORDER — ESTRADIOL 0.5 MG PO TABS: 0.5000 mg | ORAL_TABLET | Freq: Every day | ORAL | 1 refills | Status: DC

## 2018-02-12 MED FILL — SYMBICORT 80-4.5 MCG INH: 80-4.5 | 30 days supply | Qty: 10 | Fill #0

## 2018-02-12 MED FILL — CLINDAMYCIN PHOSP 1% LOTION: 1 | 30 days supply | Qty: 60 | Fill #1

## 2018-02-12 MED FILL — MOMETASONE FUROATE 50 MCG S: 50 | 30 days supply | Qty: 17 | Fill #1

## 2018-02-12 MED FILL — ESTRADIOL 0.5 MG TABLET: 0.5 | 90 days supply | Qty: 90 | Fill #0

## 2018-02-12 NOTE — Progress Notes (Signed)
47 y.o. Marissa Horn MarriedIndianF here for annual exam.  Doing well.  Reports her hot flashes are really more bothersome to her.  Having more issues at night.  Having increased anxiety as well.  HRT discussed with pt.  She is interested in starting.  Risks and benefits based on WHI reviewed including but not limited to MI, stroke, DVT/PE, breast cancer.  She does not need progesterone as she's had a hysterectomy.  Saw Dr. Halina Andreas yesterday.  Diagnosed with ADHD.  Will be starting treatment for this as well.  Denies vaginal bleeding.  Has continued to have some skin boils on the vulva/mons/inner thighs.  Still shaving.  Has considered waxing.  Hibiclens helped.  This does seem to have occurred about the same time as her menopausal symptoms so we discussed possible changes/improvement with HRT.  I, of course, can't guarantee this but it would be interesting to see if it does help.  Patient's last menstrual period was 04/14/2012.          Sexually active: Yes.    The current method of family planning is status post hysterectomy.    Exercising: No.   Smoker:  no  Health Maintenance: Pap:  01/08/12 Normal History of abnormal Pap:  Yes, remote hx MMG:  07/20/17 BIRADS1:neg  Colonoscopy:  Never BMD:   Never TDaP:  2014 Screening Labs: PCP   reports that she has never smoked. She has never used smokeless tobacco. She reports that she does not drink alcohol or use drugs.  Past Medical History:  Diagnosis Date  . Adult ADHD   . Allergy   . Anemia   . Arthritis    hands  . Asthma    ?, inhaler use as needed  . Complication of anesthesia 1990   muscle relaxant caused lung to collapse  . Difficult intubation 1990  . Dyslipidemia   . History of chicken pox   . IBS (irritable bowel syndrome)    ?  [name] Kitchen Migraine   . UTI (lower urinary tract infection)     Past Surgical History:  Procedure Laterality Date  . ABDOMINAL HYSTERECTOMY  05/2012  . ABDOMINOPLASTY  05/04/2012   Procedure:  ABDOMINOPLASTY;  Surgeon: Crissie Reese, MD;  Location: [facility];  Service: Plastics;  Laterality: N/A;  . ANAL FISSURE REPAIR     age 23  . EYE SURGERY Left    age 30  . GYNECOLOGIC CRYOSURGERY    . WISDOM TOOTH EXTRACTION      Current Outpatient Medications  Medication Sig Dispense Refill  . budesonide-formoterol (SYMBICORT) 80-4.5 MCG/ACT inhaler INHALE 2 PUFFS BY MOUTH INTO THE LUNGS 2 TIMES DAILY. 10.2 g 5  . cyanocobalamin (,VITAMIN B-12,) 1000 MCG/ML injection Take B12 1,000 mcg IM once every 2 weeks for 2 months, then once monthly. 10 mL 2  . ergocalciferol (VITAMIN D2) 50000 units capsule Take 1 capsule (50,000 Units total) by mouth once a week. 12 capsule 3  . ketotifen (ZADITOR) 0.025 % ophthalmic solution Place 1 drop into both eyes 2 (two) times daily.    [name] Kitchen levalbuterol (XOPENEX HFA) 45 MCG/ACT inhaler Inhale 1-2 puffs into the lungs every 6 (six) hours as needed for wheezing. 1 Inhaler 0  . levalbuterol (XOPENEX) 1.25 MG/3ML nebulizer solution Take 1.25 mg by nebulization every 4 (four) hours as needed for wheezing. 72 mL 0  . levocetirizine (XYZAL) 5 MG tablet Take 1 tablet (5 mg total) by mouth daily as needed. 90 tablet 3  . mometasone (ELOCON) 0.1 % cream  Apply 1 application topically daily. (Patient taking differently: Apply 1 application topically daily as needed. ) 45 g 2  . mometasone (NASONEX) 50 MCG/ACT nasal spray Place 2 sprays into the nose daily. 17 g 12  . NEEDLE, DISP, 25 G (BD ECLIPSE NEEDLE) 25G X 5/8" MISC Use as directed. 10 each 5  . ranitidine (ZANTAC) 150 MG capsule Take 150 mg by mouth 2 (two) times daily.      No current facility-administered medications for this visit.     Family History  Problem Relation Age of Onset  . Arthritis Mother   . Hyperlipidemia Mother   . Glaucoma Mother   . Irritable bowel syndrome Mother   . Cataracts Mother   . Hyperlipidemia Father   . Heart disease Father 39       CAD  . Cataracts Father   . Thyroid disease  Sister   . Hyperlipidemia Brother   . Ulcerative colitis Brother     Review of Systems  All other systems reviewed and are negative.   Exam:   BP 110/90 (BP Location: Right Arm, Patient Position: Sitting, Cuff Size: Normal)   Pulse 84   Resp 16   Ht 5' 0.5" (1.537 m)   Wt 156 lb 12.8 oz (71.1 kg)   LMP 04/14/2012   BMI 30.12 kg/m    Height: 5' 0.5" (153.7 cm)  Ht Readings from Last 3 Encounters:  02/12/18 5' 0.5" (1.537 m)  09/02/17 5' (1.524 m)  05/12/17 5' 0.5" (1.537 m)    General appearance: alert, cooperative and appears stated age Head: Normocephalic, without obvious abnormality, atraumatic Neck: no adenopathy, supple, symmetrical, trachea midline and thyroid normal to inspection and palpation Lungs: clear to auscultation bilaterally Breasts: normal appearance, no masses or tenderness Heart: regular rate and rhythm Abdomen: soft, non-tender; bowel sounds normal; no masses,  no organomegaly Extremities: extremities normal, atraumatic, no cyanosis or edema Skin: Skin color, texture, turgor normal. No rashes or lesions Lymph nodes: Cervical, supraclavicular, and axillary nodes normal. No abnormal inguinal nodes palpated Neurologic: Grossly normal   Pelvic: External genitalia:  no lesions              Urethra:  normal appearing urethra with no masses, tenderness or lesions              Bartholins and Skenes: normal                 Vagina: normal appearing vagina with normal color and discharge, no lesions              Cervix: absent              Pap taken: No. Bimanual Exam:  Uterus:  uterus absent              Adnexa: no mass, fullness, tenderness               Rectovaginal: Confirms               Anus:  normal sphincter tone, no lesions  Chaperone was present for exam.  A:  Well Woman with normal exam H/O robotic assisted TLH/bilateral salpingectomy 10/13 H/O elevated lipids (followed by Dr. Elease Hashimoto) Asthma H/O recurrent vulvar  abscesses/folliculitis Vasomotor symptoms  P:   Mammogram guidelines reviewed pap smear not indicated Estradiol 0.75m daily.  #90/1RF.  She is going to give an updated in 1-2 months.  Knows to call with any concerns.   New ACS colorectal screening guidelines reviewed.  IFOB given with instructions.  Order placed. Colonoscopy will be recommended at 44. Blood work with PCP.  Vaccines are UTD. Return annually or prn

## 2018-02-17 ENCOUNTER — Encounter: Payer: Self-pay | Admitting: Family Medicine

## 2018-02-17 ENCOUNTER — Ambulatory Visit: Payer: 59 | Admitting: Family Medicine

## 2018-02-17 DIAGNOSIS — F909 Attention-deficit hyperactivity disorder, unspecified type: Secondary | ICD-10-CM

## 2018-02-17 MED ORDER — AMPHETAMINE-DEXTROAMPHETAMINE 10 MG PO TABS: 10.0000 mg | ORAL_TABLET | Freq: Two times a day (BID) | ORAL | 0 refills | Status: DC

## 2018-02-17 MED FILL — AMPHETAMINE-DEXTROAMPHETAMI: 10 | 30 days supply | Qty: 60 | Fill #0

## 2018-02-17 NOTE — Telephone Encounter (Signed)
Okay to close this encounter?

## 2018-02-17 NOTE — Progress Notes (Signed)
Subjective:     Patient ID: Marissa Horn, female   DOB: Mar 01, 1971, 47 y.o.   MRN: [id number]  HPI Patient here to discuss possible initiation of medication for attention deficit hyperactivity disorder. Refer to recent note. She was seen here in June with concerns about possible cognitive decline. She had prior history of head injury about 3 years ago and she felt symptoms are worse since then. She was referred to neuropsychologist for further testing. She had very thorough testing which revealed clear evidence for attention deficit hyperactivity disorder. No evidence for major cognitive impairment. Recommendation was for further psychological counseling and consideration for medication  Patient has a son with attention deficit disorder. He is treated with Focalin.   Patient has never been on stimulant medication or any attention deficit hyperactivity disorder medications previously. No recent insomnia issues. Appetite and weight stable. No cardiac history. No history of hypertension. No history of seizures  Past Medical History:  Diagnosis Date  . Adult ADHD   . Allergy   . Anemia   . Arthritis    hands  . Asthma    ?, inhaler use as needed  . Complication of anesthesia 1990   muscle relaxant caused lung to collapse  . Difficult intubation 1990  . Dyslipidemia   . History of chicken pox   . IBS (irritable bowel syndrome)    ?  [name] Kitchen Migraine   . UTI (lower urinary tract infection)    Past Surgical History:  Procedure Laterality Date  . ABDOMINAL HYSTERECTOMY  05/2012  . ABDOMINOPLASTY  05/04/2012   Procedure: ABDOMINOPLASTY;  Surgeon: Crissie Reese, MD;  Location: [facility];  Service: Plastics;  Laterality: N/A;  . ANAL FISSURE REPAIR     age 56  . EYE SURGERY Left    age 8  . GYNECOLOGIC CRYOSURGERY    . WISDOM TOOTH EXTRACTION      reports that she has never smoked. She has never used smokeless tobacco. She reports that she does not drink alcohol or use drugs. family history  includes Arthritis in her mother; Cataracts in her father and mother; Glaucoma in her mother; Heart disease (age of onset: 61) in her father; Hyperlipidemia in her brother, father, and mother; Irritable bowel syndrome in her mother; Thyroid disease in her sister; Ulcerative colitis in her brother. Allergies  Allergen Reactions  . Scopolamine Anaphylaxis  . Bactrim [Sulfamethoxazole-Trimethoprim]     Joint pain, fever  . Biaxin [Clarithromycin] Nausea And Vomiting    Projectile vomiting  . Tamiflu [Oseltamivir Phosphate] Nausea And Vomiting    Projectile vomiting  . Cephalexin Nausea And Vomiting  . Sulfa Antibiotics     Unknown reaction  . Asa [Aspirin] Rash    Pt takes ibuprofen without problems     Review of Systems  Respiratory: Negative for shortness of breath.   Cardiovascular: Negative for chest pain.  Neurological: Negative for headaches.  Psychiatric/Behavioral: Negative for confusion and dysphoric mood.       Objective:   Physical Exam  Constitutional: She is oriented to person, place, and time. She appears well-developed and well-nourished.  Cardiovascular: Normal rate and regular rhythm.  No murmur heard. Pulmonary/Chest: Effort normal and breath sounds normal.  Neurological: She is alert and oriented to person, place, and time.  Psychiatric: She has a normal mood and affect. Her behavior is normal.       Assessment:     Attention deficit hyperactivity disorder with recent in-depth neuropsychological assessment    Plan:     -  Patient is waiting to set up referral to psychologist for further behavioral intervention -Long discussion regarding various medication options. We decided to start Adderall 10 mg twice daily and reviewed potential side effects. We'll schedule follow-up in one month reassess  Eulas Post MD [facility]

## 2018-02-17 NOTE — Patient Instructions (Signed)
Amphetamine; Dextroamphetamine tablets What is this medicine? AMPHETAMINE; DEXTROAMPHETAMINE(am FET a meen; dex troe am FET a meen) is used to treat attention-deficit hyperactivity disorder (ADHD). It may also be used for narcolepsy. Federal law prohibits giving this medicine to any person other than the person for whom it was prescribed. Do not share this medicine with anyone else. This medicine may be used for other purposes; ask your health care provider or pharmacist if you have questions. COMMON BRAND NAME(S): Adderall What should I tell my health care provider before I take this medicine? They need to know if you have any of these conditions: -anxiety or panic attacks -circulation problems in fingers and toes -glaucoma -hardening or blockages of the arteries or heart blood vessels -heart disease or a heart defect -high blood pressure -history of a drug or alcohol abuse problem -history of stroke -kidney disease -liver disease -mental illness -seizures -suicidal thoughts, plans, or attempt; a previous suicide attempt by you or a family member -thyroid disease -Tourette's syndrome -an unusual or allergic reaction to dextroamphetamine, other amphetamines, other medicines, foods, dyes, or preservatives -pregnant or trying to get pregnant -breast-feeding How should I use this medicine? Take this medicine by mouth with a glass of water. Follow the directions on the prescription label. Take your doses at regular intervals. Do not take your medicine more often than directed. Do not suddenly stop your medicine. You must gradually reduce the dose or you may feel withdrawal effects. Ask your doctor or health care professional for advice. Talk to your pediatrician regarding the use of this medicine in children. Special care may be needed. While this drug may be prescribed for children as young as 3 years for selected conditions, precautions do apply. Overdosage: If you think you have taken too  much of this medicine contact a poison control center or emergency room at once. NOTE: This medicine is only for you. Do not share this medicine with others. What if I miss a dose? If you miss a dose, take it as soon as you can. If it is almost time for your next dose, take only that dose. Do not take double or extra doses. What may interact with this medicine? Do not take this medicine with any of the following medications: -MAOIS like Carbex, Eldepryl, Marplan, Nardil, and Parnate -other stimulant medicines for attention disorders, weight loss, or to stay awake This medicine may also interact with the following medications: -acetazolamide -ammonium chloride -antacids -ascorbic acid -atomoxetine -caffeine -certain medicines for blood pressure -certain medicines for depression, anxiety, or psychotic disturbances -certain medicines for seizures like carbamazepine, phenobarbital, phenytoin -certain medicines for stomach problems like cimetidine, famotidine, omeprazole, lansoprazole -cold or allergy medicines -glutamic acid -lithium -meperidine -methenamine; sodium acid phosphate -narcotic medicines for pain -norepinephrine -phenothiazines like chlorpromazine, mesoridazine, prochlorperazine, thioridazine -sodium acid phosphate -sodium bicarbonate This list may not describe all possible interactions. Give your health care provider a list of all the medicines, herbs, non-prescription drugs, or dietary supplements you use. Also tell them if you smoke, drink alcohol, or use illegal drugs. Some items may interact with your medicine. What should I watch for while using this medicine? Visit your doctor or health care professional for regular checks on your progress. This prescription requires that you follow special procedures with your doctor and pharmacy. You will need to have a new written prescription from your doctor every time you need a refill. This medicine may affect your  concentration, or hide signs of tiredness. Until you know how this  medicine affects you, do not drive, ride a bicycle, use machinery, or do anything that needs mental alertness. Tell your doctor or health care professional if this medicine loses its effects, or if you feel you need to take more than the prescribed amount. Do not change the dosage without talking to your doctor or health care professional. Decreased appetite is a common side effect when starting this medicine. Eating small, frequent meals or snacks can help. Talk to your doctor if you continue to have poor eating habits. Height and weight growth of a child taking this medicine will be monitored closely. Do not take this medicine close to bedtime. It may prevent you from sleeping. If you are going to need surgery, a MRI, CT scan, or other procedure, tell your doctor that you are taking this medicine. You may need to stop taking this medicine before the procedure. Tell your doctor or healthcare professional right away if you notice unexplained wounds on your fingers and toes while taking this medicine. You should also tell your healthcare provider if you experience numbness or pain, changes in the skin color, or sensitivity to temperature in your fingers or toes. What side effects may I notice from receiving this medicine? Side effects that you should report to your doctor or health care professional as soon as possible: -allergic reactions like skin rash, itching or hives, swelling of the face, lips, or tongue -changes in vision -chest pain or chest tightness -confusion, trouble speaking or understanding -fast, irregular heartbeat -fingers or toes feel numb, cool, painful -hallucination, loss of contact with reality -high blood pressure -males: prolonged or painful erection -seizures -severe headaches -shortness of breath -suicidal thoughts or other mood changes -trouble walking, dizziness, loss of balance or  coordination -uncontrollable head, mouth, neck, arm, or leg movements Side effects that usually do not require medical attention (report to your doctor or health care professional if they continue or are bothersome): -anxious -headache -loss of appetite -nausea, vomiting -trouble sleeping -weight loss This list may not describe all possible side effects. Call your doctor for medical advice about side effects. You may report side effects to FDA at 1-800-FDA-1088. Where should I keep my medicine? Keep out of the reach of children. This medicine can be abused. Keep your medicine in a safe place to protect it from theft. Do not share this medicine with anyone. Selling or giving away this medicine is dangerous and against the law. Store at room temperature between 15 and 30 degrees C (59 and 86 degrees F). Keep container tightly closed. Throw away any unused medicine after the expiration date. Dispose of properly. This medicine may cause accidental overdose and death if it is taken by other adults, children, or pets. Mix any unused medicine with a substance like cat litter or coffee grounds. Then throw the medicine away in a sealed container like a sealed bag or a coffee can with a lid. Do not use the medicine after the expiration date. NOTE: This sheet is a summary. It may not cover all possible information. If you have questions about this medicine, talk to your doctor, pharmacist, or health care provider.  2018 Elsevier/Gold Standard (2014-05-24 18:44:41)

## 2018-02-17 NOTE — Telephone Encounter (Signed)
Patient was seen on 02/12/2018 for aex with Dr.Miller. Encounter closed.

## 2018-02-22 ENCOUNTER — Ambulatory Visit: Payer: 59 | Admitting: Family Medicine

## 2018-03-08 ENCOUNTER — Encounter: Payer: Self-pay | Admitting: Family Medicine

## 2018-03-08 ENCOUNTER — Ambulatory Visit: Payer: 59 | Admitting: Family Medicine

## 2018-03-08 VITALS — BP 110/80 | HR 73 | Temp 98.5°F | Wt 158.2 lb

## 2018-03-08 DIAGNOSIS — J45901 Unspecified asthma with (acute) exacerbation: Secondary | ICD-10-CM | POA: Diagnosis not present

## 2018-03-08 DIAGNOSIS — F909 Attention-deficit hyperactivity disorder, unspecified type: Secondary | ICD-10-CM

## 2018-03-08 MED ORDER — LISDEXAMFETAMINE DIMESYLATE 30 MG PO CAPS
30.0000 mg | ORAL_CAPSULE | Freq: Every day | ORAL | 0 refills | Status: DC
Start: 2018-03-08 — End: 2018-05-07

## 2018-03-08 MED ORDER — PREDNISONE 20 MG PO TABS: ORAL_TABLET | ORAL | 0 refills | Status: DC

## 2018-03-08 MED FILL — VYVANSE 30 MG CAPSULE: 30 | 30 days supply | Qty: 30 | Fill #0

## 2018-03-08 MED FILL — predniSONE 20 MG TABS: 20 | 5 days supply | Qty: 10 | Fill #0

## 2018-03-08 NOTE — Progress Notes (Signed)
Subjective:     Patient ID: Marissa Horn, female   DOB: 1971-02-11, 47 y.o.   MRN: [id number]  HPI Patient seen with concern for possible asthma flare. She has in the past taken Symbicort but basically took herself off this regularly. She's not had an asthma flare in quite some time. Several days ago they had someone clean filters at her house and she thinks dust may be a trigger. She had some increased coughing and wheezing. Slightly improved today. She's had some postnasal drip symptoms. No fever. Nonsmoker. Using Xopenex without much improvement. She started herself back on Symbicort 2 puffs twice daily few days ago.  Second issue is attention deficit disorder. Recent psychological evaluation. We started low-dose Adderall. She did not see much improvement. She also had concerns for possible side effects with headache and upset stomach- with the Adderall. She would like to explore other possible medication options.  Past Medical History:  Diagnosis Date  . Adult ADHD   . Allergy   . Anemia   . Arthritis    hands  . Asthma    ?, inhaler use as needed  . Complication of anesthesia 1990   muscle relaxant caused lung to collapse  . Difficult intubation 1990  . Dyslipidemia   . History of chicken pox   . IBS (irritable bowel syndrome)    ?  [name] Kitchen Migraine   . UTI (lower urinary tract infection)    Past Surgical History:  Procedure Laterality Date  . ABDOMINAL HYSTERECTOMY  05/2012  . ABDOMINOPLASTY  05/04/2012   Procedure: ABDOMINOPLASTY;  Surgeon: Crissie Reese, MD;  Location: [facility];  Service: Plastics;  Laterality: N/A;  . ANAL FISSURE REPAIR     age 80  . EYE SURGERY Left    age 88  . GYNECOLOGIC CRYOSURGERY    . WISDOM TOOTH EXTRACTION      reports that she has never smoked. She has never used smokeless tobacco. She reports that she does not drink alcohol or use drugs. family history includes Arthritis in her mother; Cataracts in her father and mother; Glaucoma in her mother;  Heart disease (age of onset: 60) in her father; Hyperlipidemia in her brother, father, and mother; Irritable bowel syndrome in her mother; Thyroid disease in her sister; Ulcerative colitis in her brother. Allergies  Allergen Reactions  . Scopolamine Anaphylaxis  . Bactrim [Sulfamethoxazole-Trimethoprim]     Joint pain, fever  . Biaxin [Clarithromycin] Nausea And Vomiting    Projectile vomiting  . Tamiflu [Oseltamivir Phosphate] Nausea And Vomiting    Projectile vomiting  . Cephalexin Nausea And Vomiting  . Sulfa Antibiotics     Unknown reaction  . Asa [Aspirin] Rash    Pt takes ibuprofen without problems     Review of Systems  Constitutional: Negative for chills, fatigue and fever.  Eyes: Negative for visual disturbance.  Respiratory: Positive for cough and wheezing. Negative for chest tightness and shortness of breath.   Cardiovascular: Negative for chest pain, palpitations and leg swelling.  Neurological: Negative for dizziness, seizures, syncope, weakness, light-headedness and headaches.       Objective:   Physical Exam  Constitutional: She appears well-developed and well-nourished.  HENT:  Mouth/Throat: Oropharynx is clear and moist.  Neck: Neck supple.  Cardiovascular: Normal rate and regular rhythm.  Pulmonary/Chest:  No respiratory distress. Pulse oximetry 94%. No actual wheezes noted at this time. No retractions       Assessment:     #1 asthma history with recent acute exacerbation.  Patient in no respiratory distress  #2 attention deficit disorder. Possible side effects with Adderall    Plan:     -Prednisone 20 mg take 2 tablets daily for 5 days -Get back on regular use of Symbicort 2 puffs twice daily -Consider trial of Vyvanse 30 mg once daily and follow-up in one month to reassess  Eulas Post MD [facility] at [facility]

## 2018-03-19 ENCOUNTER — Ambulatory Visit: Payer: Self-pay | Admitting: Family Medicine

## 2018-03-26 ENCOUNTER — Ambulatory Visit: Payer: 59 | Admitting: Obstetrics & Gynecology

## 2018-03-29 ENCOUNTER — Encounter: Payer: Self-pay | Admitting: Family Medicine

## 2018-03-31 ENCOUNTER — Ambulatory Visit: Payer: 59 | Admitting: Psychology

## 2018-03-31 DIAGNOSIS — F902 Attention-deficit hyperactivity disorder, combined type: Secondary | ICD-10-CM

## 2018-04-16 ENCOUNTER — Encounter: Payer: Self-pay | Admitting: Family Medicine

## 2018-04-16 ENCOUNTER — Telehealth: Payer: Self-pay | Admitting: *Deleted

## 2018-04-16 ENCOUNTER — Encounter: Payer: Self-pay | Admitting: Obstetrics & Gynecology

## 2018-04-16 NOTE — Telephone Encounter (Signed)
Routing to Dr. Lestine Box.

## 2018-04-16 NOTE — Telephone Encounter (Signed)
Dr. Elease Hashimoto please advise. Thanks

## 2018-04-16 NOTE — Telephone Encounter (Signed)
Dear Dr. Sabra Heck,    I hope you are having a lovely week thus far! I have been meaning to write to you, as you requested, and failed to do so sooner. The Estrogen you have subscribed to me has been life changing!!  I have more energy, barely getting those horrible hot flashes, Anxiety level has lowered enormously, and I fall asleep right away at night! The dosage seems perfect thus far and I am very grateful to you for this, as well!!! It took my body about 2-3 weeks to be fully adjusted for full results, which is amazing as well. If I miss a day, which has happened a few times, I do notice a difference!     So thanks again, for making my life easier and happier!    [place],    [place]

## 2018-04-19 ENCOUNTER — Other Ambulatory Visit: Payer: Self-pay

## 2018-04-19 ENCOUNTER — Ambulatory Visit: Payer: 59 | Admitting: Family Medicine

## 2018-04-19 ENCOUNTER — Encounter: Payer: Self-pay | Admitting: Family Medicine

## 2018-04-19 VITALS — BP 134/84 | HR 76 | Temp 98.3°F | Resp 16 | Ht 60.5 in | Wt 157.2 lb

## 2018-04-19 DIAGNOSIS — Z789 Other specified health status: Secondary | ICD-10-CM

## 2018-04-19 DIAGNOSIS — Z888 Allergy status to other drugs, medicaments and biological substances status: Secondary | ICD-10-CM

## 2018-04-19 NOTE — Progress Notes (Signed)
Subjective:     Patient ID: Marissa Horn, female   DOB: August 26, 1970, 47 y.o.   MRN: [id number]  HPI Patient here to discuss possible pharmacogenetics testing. She was recently diagnosed with adult ADD. We initially tried Adderall but she had some GI upset. We then tried Vyvanse but this seemed to trigger headaches and she had continuous headaches for 3 days. She also did not see any clear benefit with her ADD. She had seen neuropsychologist for testing.  She has noted, in general ,that she does not tolerate many medications. She also states she has general intolerance of several things like alcohol. She has questions regarding whether she should look at pharmacogenetics testing. Apparently psychologist had recommended consideration for that. She had checked about one specific company but is not sure if her insurance will cover this.  Past Medical History:  Diagnosis Date  . Adult ADHD   . Allergy   . Anemia   . Arthritis    hands  . Asthma    ?, inhaler use as needed  . Complication of anesthesia 1990   muscle relaxant caused lung to collapse  . Difficult intubation 1990  . Dyslipidemia   . History of chicken pox   . IBS (irritable bowel syndrome)    ?  [name] Kitchen Migraine   . UTI (lower urinary tract infection)    Past Surgical History:  Procedure Laterality Date  . ABDOMINAL HYSTERECTOMY  05/2012  . ABDOMINOPLASTY  05/04/2012   Procedure: ABDOMINOPLASTY;  Surgeon: Crissie Reese, MD;  Location: [facility];  Service: Plastics;  Laterality: N/A;  . ANAL FISSURE REPAIR     age 73  . EYE SURGERY Left    age 74  . GYNECOLOGIC CRYOSURGERY    . WISDOM TOOTH EXTRACTION      reports that she has never smoked. She has never used smokeless tobacco. She reports that she does not drink alcohol or use drugs. family history includes Arthritis in her mother; Cataracts in her father and mother; Glaucoma in her mother; Heart disease (age of onset: 77) in her father; Hyperlipidemia in her brother, father,  and mother; Irritable bowel syndrome in her mother; Thyroid disease in her sister; Ulcerative colitis in her brother. Allergies  Allergen Reactions  . Scopolamine Anaphylaxis  . Vyvanse [Lisdexamfetamine Dimesylate]     Severe migraine  . Bactrim [Sulfamethoxazole-Trimethoprim]     Joint pain, fever  . Biaxin [Clarithromycin] Nausea And Vomiting    Projectile vomiting  . Tamiflu [Oseltamivir Phosphate] Nausea And Vomiting    Projectile vomiting  . Cephalexin Nausea And Vomiting  . Sulfa Antibiotics     Unknown reaction  . Asa [Aspirin] Rash    Pt takes ibuprofen without problems     Review of Systems  Constitutional: Negative for appetite change and unexpected weight change.  Respiratory: Negative for shortness of breath.   Cardiovascular: Negative for chest pain.  Neurological: Negative for headaches.       Objective:   Physical Exam  Constitutional: She appears well-developed and well-nourished.  Cardiovascular: Normal rate and regular rhythm.  Pulmonary/Chest: Effort normal and breath sounds normal.  Psychiatric: She has a normal mood and affect. Her behavior is normal.       Assessment:     History of frequent medication intolerance in the past. Patient has several questions regarding pharmacokinetic testing principles    Plan:     -we reviewed Up-to-Date regarding pharmacogenetics overview and gave her some information -patient's husband is a physician and they  will review this together. We've also asked that she check with her insurance, and see if they may cover any type of specific testing. We've explained that from our knowledge base pharmacogenetic testing makes sense for some specific medications but is not a standard of practice for many other medications.  Eulas Post MD [facility] at [facility]

## 2018-04-22 ENCOUNTER — Ambulatory Visit (INDEPENDENT_AMBULATORY_CARE_PROVIDER_SITE_OTHER): Payer: 59 | Admitting: Psychology

## 2018-04-22 DIAGNOSIS — F430 Acute stress reaction: Secondary | ICD-10-CM

## 2018-04-29 ENCOUNTER — Ambulatory Visit: Payer: 59 | Admitting: Psychology

## 2018-05-06 ENCOUNTER — Encounter: Payer: Self-pay | Admitting: Family Medicine

## 2018-05-07 ENCOUNTER — Encounter: Payer: Self-pay | Admitting: Family Medicine

## 2018-05-07 ENCOUNTER — Other Ambulatory Visit: Payer: Self-pay

## 2018-05-07 ENCOUNTER — Telehealth: Payer: Self-pay | Admitting: Family Medicine

## 2018-05-07 ENCOUNTER — Ambulatory Visit: Payer: 59 | Admitting: Family Medicine

## 2018-05-07 VITALS — BP 132/80 | HR 86 | Temp 98.3°F | Ht 60.5 in | Wt 155.3 lb

## 2018-05-07 DIAGNOSIS — J4520 Mild intermittent asthma, uncomplicated: Secondary | ICD-10-CM

## 2018-05-07 DIAGNOSIS — B9789 Other viral agents as the cause of diseases classified elsewhere: Secondary | ICD-10-CM

## 2018-05-07 DIAGNOSIS — J069 Acute upper respiratory infection, unspecified: Secondary | ICD-10-CM | POA: Diagnosis not present

## 2018-05-07 DIAGNOSIS — N3281 Overactive bladder: Secondary | ICD-10-CM | POA: Diagnosis not present

## 2018-05-07 DIAGNOSIS — R32 Unspecified urinary incontinence: Secondary | ICD-10-CM | POA: Diagnosis not present

## 2018-05-07 DIAGNOSIS — F909 Attention-deficit hyperactivity disorder, unspecified type: Secondary | ICD-10-CM | POA: Diagnosis not present

## 2018-05-07 LAB — POCT URINALYSIS DIPSTICK
Bilirubin, UA: NEGATIVE
Blood, UA: NEGATIVE
Glucose, UA: NEGATIVE
KETONES UA: NEGATIVE
LEUKOCYTES UA: NEGATIVE
NITRITE UA: NEGATIVE
PH UA: 6 (ref 5.0–8.0)
PROTEIN UA: NEGATIVE
SPEC GRAV UA: 1.025 (ref 1.010–1.025)
UROBILINOGEN UA: 1 U/dL

## 2018-05-07 MED ORDER — MOMETASONE FUROATE 0.1 % EX CREA: 1.0000 "application " | TOPICAL_CREAM | Freq: Every day | CUTANEOUS | 11 refills | Status: DC | PRN

## 2018-05-07 MED ORDER — MOMETASONE FUROATE 50 MCG/ACT NA SUSP: 2.0000 | Freq: Every day | NASAL | 12 refills | Status: DC

## 2018-05-07 MED ORDER — DEXMETHYLPHENIDATE HCL 5 MG PO TABS: 5.0000 mg | ORAL_TABLET | Freq: Two times a day (BID) | ORAL | 0 refills | Status: DC

## 2018-05-07 NOTE — Patient Instructions (Signed)
Consider saline nasal irrigation (with distilled water- not tap) with Netti pot.

## 2018-05-07 NOTE — Progress Notes (Signed)
Subjective:     Patient ID: Marissa Horn, female   DOB: October 30, 1970, 47 y.o.   MRN: [id number]  HPI Patient is a non-smoker with history of asthma who was seen with onset about Monday of this week of some URI type symptoms.  She had some initial sore throat.  She has had some nasal congestion and occasional dry cough.  She has not taken her temperature.  Question of low-grade fever intermittently.  She had some chills last night but none currently.  Increased fatigue.  Intermittent headaches.  Frequent nasal congestion.  Using Vicks over-the-counter with some improvement.  Requesting refills of Nasonex and Elocon cream.  History of ADD.  She was previously tried on Vyvanse but did not see much improvement.  She states her son is on Focalin and she took one and that seemed to work very well for her.  She had recent testing per neuropsychology which confirmed ADD.  She also states she has had a couple episodes of mild urine leakage with things like walking.  She was concerned she may have UTI.  Denies any significant burning with urination.  No hematuria.  Hx of some urgency     Past Medical History:  Diagnosis Date  . Adult ADHD   . Allergy   . Anemia   . Arthritis    hands  . Asthma    ?, inhaler use as needed  . Complication of anesthesia 1990   muscle relaxant caused lung to collapse  . Difficult intubation 1990  . Dyslipidemia   . History of chicken pox   . IBS (irritable bowel syndrome)    ?  [name] Kitchen Migraine   . UTI (lower urinary tract infection)    Past Surgical History:  Procedure Laterality Date  . ABDOMINAL HYSTERECTOMY  05/2012  . ABDOMINOPLASTY  05/04/2012   Procedure: ABDOMINOPLASTY;  Surgeon: Crissie Reese, MD;  Location: [facility];  Service: Plastics;  Laterality: N/A;  . ANAL FISSURE REPAIR     age 66  . EYE SURGERY Left    age 57  . GYNECOLOGIC CRYOSURGERY    . WISDOM TOOTH EXTRACTION      reports that she has never smoked. She has never used smokeless tobacco. She  reports that she does not drink alcohol or use drugs. family history includes Arthritis in her mother; Cataracts in her father and mother; Glaucoma in her mother; Heart disease (age of onset: 26) in her father; Hyperlipidemia in her brother, father, and mother; Irritable bowel syndrome in her mother; Thyroid disease in her sister; Ulcerative colitis in her brother. Allergies  Allergen Reactions  . Scopolamine Anaphylaxis  . Vyvanse [Lisdexamfetamine Dimesylate]     Severe migraine  . Bactrim [Sulfamethoxazole-Trimethoprim]     Joint pain, fever  . Biaxin [Clarithromycin] Nausea And Vomiting    Projectile vomiting  . Tamiflu [Oseltamivir Phosphate] Nausea And Vomiting    Projectile vomiting  . Cephalexin Nausea And Vomiting  . Sulfa Antibiotics     Unknown reaction  . Asa [Aspirin] Rash    Pt takes ibuprofen without problems     Review of Systems  Constitutional: Positive for chills and fatigue.  HENT: Positive for congestion and sinus pressure.   Respiratory: Positive for cough. Negative for shortness of breath.   Cardiovascular: Negative for chest pain.  Neurological: Positive for headaches.       Objective:   Physical Exam  Constitutional: She appears well-developed and well-nourished.  HENT:  Right Ear: External ear normal.  Left  Ear: External ear normal.  Mouth/Throat: Oropharynx is clear and moist.  Neck: Neck supple.  Cardiovascular: Normal rate and regular rhythm.  Pulmonary/Chest: Effort normal and breath sounds normal. She has no wheezes. She has no rales.  Musculoskeletal: She exhibits no edema.  Lymphadenopathy:    She has no cervical adenopathy.       Assessment:     #1 probable viral URI with cough.  Nonfocal exam.  No evidence for respiratory distress  #2 history of allergic rhinitis  #3 history of attention deficit disorder-recently tried Focalin which seemed to work well for her  #4 very mild urine incontinence.  Urine dipstick today is clear.  No  evidence for UTI    Plan:     -Plenty of fluids and rest regarding her URI symptoms.  Follow-up for any fever or worsening symptoms -Refilled Nasonex and Elocon -Agreed to trial of Focalin 5 mg twice daily with 1 month prescription written.  She will give some feedback in 1 month.  Eulas Post MD [facility] at [facility]

## 2018-05-07 NOTE — Telephone Encounter (Signed)
Copied from [place] [id number]. Topic: Quick Communication - Rx Refill/Question >> May 07, 2018  5:06 PM Marissa Horn wrote: Medication: mometasone (ELOCON) 0.1 % cream [[id number]]   Marissa Horn long called to ask to increase the medication up to 45g; contact to advise

## 2018-05-10 ENCOUNTER — Other Ambulatory Visit: Payer: Self-pay

## 2018-05-10 MED ORDER — MOMETASONE FUROATE 0.1 % EX CREA: 1.0000 "application " | TOPICAL_CREAM | Freq: Every day | CUTANEOUS | 11 refills | Status: DC | PRN

## 2018-05-10 MED FILL — MOMETASONE FUROATE 0.1% CRE: 0.1 | 30 days supply | Qty: 45 | Fill #0

## 2018-05-10 NOTE — Telephone Encounter (Signed)
Ok to increase dispense to 45 grams.

## 2018-05-10 NOTE — Telephone Encounter (Signed)
Please advise if okay?

## 2018-05-10 NOTE — Telephone Encounter (Signed)
Updated refill has been sent to [facility].

## 2018-05-13 ENCOUNTER — Other Ambulatory Visit: Payer: Self-pay | Admitting: Family Medicine

## 2018-05-13 ENCOUNTER — Ambulatory Visit (INDEPENDENT_AMBULATORY_CARE_PROVIDER_SITE_OTHER): Payer: 59 | Admitting: Psychology

## 2018-05-13 ENCOUNTER — Other Ambulatory Visit: Payer: Self-pay | Admitting: Internal Medicine

## 2018-05-13 DIAGNOSIS — F902 Attention-deficit hyperactivity disorder, combined type: Secondary | ICD-10-CM | POA: Diagnosis not present

## 2018-05-13 DIAGNOSIS — F430 Acute stress reaction: Secondary | ICD-10-CM

## 2018-05-13 DIAGNOSIS — J4520 Mild intermittent asthma, uncomplicated: Secondary | ICD-10-CM

## 2018-05-13 MED FILL — ESTRADIOL 0.5 MG TABLET: 0.5 | 90 days supply | Qty: 90 | Fill #1

## 2018-05-13 MED FILL — SYMBICORT 80-4.5 MCG INH: 80-4.5 | 30 days supply | Qty: 10 | Fill #1

## 2018-05-13 MED FILL — CYANOCOBALAMIN 1,000 MCG/ML: 1000 | 84 days supply | Qty: 3 | Fill #1

## 2018-05-13 MED FILL — MOMETASONE FUROATE 50 MCG S: 50 | 30 days supply | Qty: 17 | Fill #2

## 2018-05-13 MED FILL — VIT D2 1.25 MG (50,000 UNIT: 1.25 MG | 84 days supply | Qty: 12 | Fill #1

## 2018-05-14 MED FILL — LEVALBUTEROL HCL 1.25 MG/3M: 1.25 | 5 days supply | Qty: 90 | Fill #0

## 2018-05-14 MED FILL — LEVALBUTEROL TAR HFA 45MCG: 45 | 25 days supply | Qty: 15 | Fill #0

## 2018-05-18 ENCOUNTER — Other Ambulatory Visit: Payer: Self-pay | Admitting: Adult Health

## 2018-05-18 ENCOUNTER — Encounter: Payer: Self-pay | Admitting: Family Medicine

## 2018-05-18 MED ORDER — PREDNISONE 20 MG PO TABS: 20.0000 mg | ORAL_TABLET | Freq: Every day | ORAL | 0 refills | Status: DC

## 2018-05-19 MED FILL — predniSONE 20 MG TABS: 20 | 5 days supply | Qty: 5 | Fill #0

## 2018-05-27 ENCOUNTER — Ambulatory Visit: Payer: 59 | Admitting: Psychology

## 2018-06-29 MED FILL — DEXMETHYLPHENIDATE 5 MG TAB: 5 | 30 days supply | Qty: 60 | Fill #0

## 2018-07-09 ENCOUNTER — Other Ambulatory Visit: Payer: Self-pay | Admitting: Obstetrics & Gynecology

## 2018-07-09 DIAGNOSIS — Z1231 Encounter for screening mammogram for malignant neoplasm of breast: Secondary | ICD-10-CM

## 2018-07-20 MED FILL — MOMETASONE FUROATE 0.1% CRE: 0.1 | 30 days supply | Qty: 45 | Fill #1

## 2018-07-20 MED FILL — SYMBICORT 80-4.5 MCG INH: 80-4.5 | 30 days supply | Qty: 10 | Fill #2

## 2018-07-20 MED FILL — DEXMETHYLPHENIDATE 5 MG TAB: 5 | 30 days supply | Qty: 60 | Fill #0

## 2018-08-26 ENCOUNTER — Ambulatory Visit
Admission: RE | Admit: 2018-08-26 | Discharge: 2018-08-26 | Disposition: A | Discharge status: Home / Self Care | Payer: 59 | Source: Ambulatory Visit | Attending: Obstetrics & Gynecology | Admitting: Obstetrics & Gynecology

## 2018-08-26 DIAGNOSIS — Z1231 Encounter for screening mammogram for malignant neoplasm of breast: Secondary | ICD-10-CM

## 2018-09-08 DIAGNOSIS — H5213 Myopia, bilateral: Secondary | ICD-10-CM | POA: Diagnosis not present

## 2018-09-10 ENCOUNTER — Other Ambulatory Visit: Payer: Self-pay

## 2018-09-10 ENCOUNTER — Encounter: Payer: Self-pay | Admitting: Family Medicine

## 2018-09-10 ENCOUNTER — Ambulatory Visit (INDEPENDENT_AMBULATORY_CARE_PROVIDER_SITE_OTHER): Payer: 59 | Admitting: Family Medicine

## 2018-09-10 VITALS — BP 118/70 | HR 72 | Temp 98.2°F | Ht 59.75 in | Wt 142.4 lb

## 2018-09-10 DIAGNOSIS — Z0000 Encounter for general adult medical examination without abnormal findings: Secondary | ICD-10-CM

## 2018-09-10 DIAGNOSIS — E538 Deficiency of other specified B group vitamins: Secondary | ICD-10-CM | POA: Diagnosis not present

## 2018-09-10 DIAGNOSIS — Z789 Other specified health status: Secondary | ICD-10-CM

## 2018-09-10 DIAGNOSIS — E559 Vitamin D deficiency, unspecified: Secondary | ICD-10-CM

## 2018-09-10 LAB — CBC WITH DIFFERENTIAL/PLATELET
BASOS PCT: 1 % (ref 0.0–3.0)
Basophils Absolute: 0.1 10*3/uL (ref 0.0–0.1)
Eosinophils Absolute: 0.1 10*3/uL (ref 0.0–0.7)
Eosinophils Relative: 1.1 % (ref 0.0–5.0)
HEMATOCRIT: 37.5 % (ref 36.0–46.0)
HEMOGLOBIN: 12.4 g/dL (ref 12.0–15.0)
Lymphocytes Relative: 34.4 % (ref 12.0–46.0)
Lymphs Abs: 2.2 10*3/uL (ref 0.7–4.0)
MCHC: 33.1 g/dL (ref 30.0–36.0)
MCV: 85 fl (ref 78.0–100.0)
MONO ABS: 0.4 10*3/uL (ref 0.1–1.0)
Monocytes Relative: 5.7 % (ref 3.0–12.0)
Neutro Abs: 3.6 10*3/uL (ref 1.4–7.7)
Neutrophils Relative %: 57.8 % (ref 43.0–77.0)
Platelets: 342 10*3/uL (ref 150.0–400.0)
RBC: 4.41 Mil/uL (ref 3.87–5.11)
RDW: 14.3 % (ref 11.5–15.5)
WBC: 6.3 10*3/uL (ref 4.0–10.5)

## 2018-09-10 LAB — LIPID PANEL
Cholesterol: 253 mg/dL — ABNORMAL HIGH (ref 0–200)
HDL: 46.1 mg/dL (ref >39.00)
LDL CALC: 176 mg/dL — AB (ref 0–99)
NonHDL: 206.62
Total CHOL/HDL Ratio: 5
Triglycerides: 155 mg/dL — ABNORMAL HIGH (ref 0.0–149.0)
VLDL: 31 mg/dL (ref 0.0–40.0)

## 2018-09-10 LAB — VITAMIN B12: Vitamin B-12: 360 pg/mL (ref 211–911)

## 2018-09-10 LAB — HEPATIC FUNCTION PANEL
ALT: 13 U/L (ref 0–35)
AST: 12 U/L (ref 0–37)
Albumin: 4.5 g/dL (ref 3.5–5.2)
Alkaline Phosphatase: 54 U/L (ref 39–117)
Bilirubin, Direct: 0.1 mg/dL (ref 0.0–0.3)
TOTAL PROTEIN: 6.9 g/dL (ref 6.0–8.3)
Total Bilirubin: 1.1 mg/dL (ref 0.2–1.2)

## 2018-09-10 LAB — BASIC METABOLIC PANEL
BUN: 8 mg/dL (ref 6–23)
CHLORIDE: 100 meq/L (ref 96–112)
CO2: 27 mEq/L (ref 19–32)
Calcium: 9.5 mg/dL (ref 8.4–10.5)
Creatinine, Ser: 0.75 mg/dL (ref 0.40–1.20)
GFR: 82.94 mL/min (ref >60.00)
Glucose, Bld: 86 mg/dL (ref 70–99)
POTASSIUM: 4.5 meq/L (ref 3.5–5.1)
Sodium: 137 mEq/L (ref 135–145)

## 2018-09-10 LAB — VITAMIN D 25 HYDROXY (VIT D DEFICIENCY, FRACTURES): VITD: 38.31 ng/mL (ref 30.00–100.00)

## 2018-09-10 LAB — TSH: TSH: 1.91 u[IU]/mL (ref 0.35–4.50)

## 2018-09-10 LAB — HEMOGLOBIN A1C: Hgb A1c MFr Bld: 6.3 % (ref 4.6–6.5)

## 2018-09-10 MED ORDER — ERGOCALCIFEROL 1.25 MG (50000 UT) PO CAPS: 50000.0000 [IU] | ORAL_CAPSULE | ORAL | 3 refills | Status: DC

## 2018-09-10 MED ORDER — CYANOCOBALAMIN 1000 MCG/ML IJ SOLN: INTRAMUSCULAR | 2 refills | Status: DC

## 2018-09-10 MED FILL — VIT D2 1.25 MG (50,000 UNIT: 1.25 MG | 84 days supply | Qty: 12 | Fill #0

## 2018-09-10 MED FILL — CYANOCOBALAMIN 1,000 MCG/ML: 1000 | 84 days supply | Qty: 5 | Fill #0

## 2018-09-10 NOTE — Progress Notes (Signed)
Subjective:     Patient ID: Marissa Horn, female   DOB: Oct 16, 1970, 48 y.o.   MRN: [id number]  HPI Patient is seen for physical exam.  She is a vegetarian and has history of vitamin D deficiency as well as borderline low zinc and B12 deficiency  She has done a tremendous job with weight loss over the past several months and has lost about 15 pounds through intermittent fasting and also trying to reduce carbs  She has history of asthma which has been well controlled.  Tetanus was 2014.  She sees gynecologist and gets mammograms through them.  She has had previous hysterectomy for benign disease.  She does not need Pap smears.  No family history of colon cancer.  Past Medical History:  Diagnosis Date  . Adult ADHD   . Allergy   . Anemia   . Arthritis    hands  . Asthma    ?, inhaler use as needed  . Complication of anesthesia 1990   muscle relaxant caused lung to collapse  . Difficult intubation 1990  . Dyslipidemia   . History of chicken pox   . IBS (irritable bowel syndrome)    ?  [name] Kitchen Migraine   . UTI (lower urinary tract infection)    Past Surgical History:  Procedure Laterality Date  . ABDOMINAL HYSTERECTOMY  05/2012  . ABDOMINOPLASTY  05/04/2012   Procedure: ABDOMINOPLASTY;  Surgeon: Crissie Reese, MD;  Location: [facility];  Service: Plastics;  Laterality: N/A;  . ANAL FISSURE REPAIR     age 39  . EYE SURGERY Left    age 24  . GYNECOLOGIC CRYOSURGERY    . WISDOM TOOTH EXTRACTION      reports that she has never smoked. She has never used smokeless tobacco. She reports that she does not drink alcohol or use drugs. family history includes Arthritis in her mother; Cataracts in her father and mother; Glaucoma in her mother; Heart disease (age of onset: 98) in her father; Hyperlipidemia in her brother, father, and mother; Irritable bowel syndrome in her mother; Thyroid disease in her sister; Ulcerative colitis in her brother. Allergies  Allergen Reactions  . Scopolamine  Anaphylaxis  . Vyvanse [Lisdexamfetamine Dimesylate]     Severe migraine  . Bactrim [Sulfamethoxazole-Trimethoprim]     Joint pain, fever  . Biaxin [Clarithromycin] Nausea And Vomiting    Projectile vomiting  . Tamiflu [Oseltamivir Phosphate] Nausea And Vomiting    Projectile vomiting  . Cephalexin Nausea And Vomiting  . Sulfa Antibiotics     Unknown reaction  . Asa [Aspirin] Rash    Pt takes ibuprofen without problems     Review of Systems  Constitutional: Negative for activity change, appetite change, fatigue, fever and unexpected weight change.  HENT: Negative for ear pain, hearing loss, sore throat and trouble swallowing.   Eyes: Negative for visual disturbance.  Respiratory: Negative for cough and shortness of breath.   Cardiovascular: Negative for chest pain and palpitations.  Gastrointestinal: Negative for abdominal pain, blood in stool, constipation and diarrhea.  Endocrine: Negative for polydipsia and polyuria.  Genitourinary: Negative for dysuria and hematuria.  Musculoskeletal: Negative for arthralgias, back pain and myalgias.  Skin: Negative for rash.  Neurological: Negative for dizziness, syncope, weakness and headaches.  Hematological: Negative for adenopathy.  Psychiatric/Behavioral: Negative for confusion and dysphoric mood.       Objective:   Physical Exam Constitutional:      Appearance: Normal appearance.  HENT:     Right Ear: Tympanic  membrane normal.     Left Ear: Tympanic membrane normal.     Mouth/Throat:     Pharynx: Oropharynx is clear. No oropharyngeal exudate.  Neck:     Musculoskeletal: Neck supple.  Cardiovascular:     Rate and Rhythm: Normal rate and regular rhythm.     Heart sounds: No murmur.  Pulmonary:     Effort: Pulmonary effort is normal.     Breath sounds: Normal breath sounds.  Abdominal:     Palpations: Abdomen is soft. There is no mass.     Tenderness: There is no abdominal tenderness.  Musculoskeletal:     Right lower  leg: No edema.     Left lower leg: No edema.  Neurological:     Mental Status: She is alert.     Cranial Nerves: No cranial nerve deficit.        Assessment:     Physical exam.  Patient has done tremendous job with weight loss recently.  She is vegetarian and has past history of vitamin D and B12 deficiency    Plan:     -Continue weight loss/weight maintenance efforts -Check labs including B12 and vitamin D level.  Patient also requesting zinc level which has been borderline low in the past.  She is requesting A1c-though this has been steadily coming down and suspect will be even better with her recent weight loss -She plans to continue with GYN follow-up. -We will need colonoscopy by age 41  Eulas Post MD [facility] at [facility]

## 2018-09-13 LAB — ZINC: Zinc: 79 ug/dL (ref 60–130)

## 2018-11-24 ENCOUNTER — Encounter: Payer: Self-pay | Admitting: Obstetrics & Gynecology

## 2018-11-25 ENCOUNTER — Telehealth: Payer: Self-pay | Admitting: Obstetrics & Gynecology

## 2018-11-25 ENCOUNTER — Other Ambulatory Visit: Payer: Self-pay | Admitting: Obstetrics & Gynecology

## 2018-11-25 ENCOUNTER — Other Ambulatory Visit: Payer: Self-pay | Admitting: Family Medicine

## 2018-11-25 DIAGNOSIS — J4520 Mild intermittent asthma, uncomplicated: Secondary | ICD-10-CM

## 2018-11-25 MED FILL — VIT D2 1.25 MG (50,000 UNIT: 1.25 MG | 84 days supply | Qty: 12 | Fill #1

## 2018-11-25 MED FILL — ESTRADIOL 0.5 MG TABS: 0.5 | 90 days supply | Qty: 90 | Fill #0

## 2018-11-25 MED FILL — CYANOCOBALAMIN 1,000 MCG/ML: 1000 | 84 days supply | Qty: 5 | Fill #1

## 2018-11-25 MED FILL — LEVALBUTEROL TAR HFA 45MCG: 45 | 25 days supply | Qty: 15 | Fill #1

## 2018-11-25 NOTE — Telephone Encounter (Signed)
I just refilled her estradiol earlier today. Missing a few doses could cause symptoms (even if it didn't in the past). She should be on the lowest dose that is tolerable for her. If she was doing well prior to the last few days, I would recommend that she continue on the current dose.

## 2018-11-25 NOTE — Telephone Encounter (Signed)
Medication refill request: estradiol 0.5tab  Last AEX:  02/12/18 SM Next AEX: 06/03/19  Last MMG (if hormonal medication request): 08/26/18 [id number]:neg  Refill authorized: 02/12/18 #[id number]. Today please advise.

## 2018-11-25 NOTE — Telephone Encounter (Signed)
Message   Dear Dr. Sabra Heck,    [place] Day you are doing well under these crazy circumstances? I am in need of a refill for my estrogen? Can u kindly refill it for 90 days through [facility]? Also, was wondering if I need a slightly higher dose? If not, the current dose should do? Also, the hibiclense has helped me loads after I started shaving with disposable razors (one time use)...no more issues like before!    A big thank you in advance,    [place],    Marissa Horn

## 2018-11-25 NOTE — Telephone Encounter (Signed)
Spoke with patient, advised as seen below per Dr. Talbert Nan. Patient is agreeable to plan, verbalizes understanding, thankful for return call.   Encounter closed.

## 2018-11-25 NOTE — Telephone Encounter (Signed)
Spoke with patient. Reports increase in hot flashes for the past 3 nights and some during the day. Has missed a few doses of estradiol, but never experienced increase in hot flashes from missing doses. Patient states she discussed with Dr. Sabra Heck the fact that she was on a low dose, would like to increase estradiol, requesting new RX, has 2 days of current RX remaining. Denies any other symptoms. Advised Dr. Sabra Heck is out of the office, will review with covering provider and return call, patient agreeable.   Last AEX 02/12/18 Next AEX 06/03/19 MMG 08/26/18: Birads 1, neg  Dr. Talbert Nan  -please advise on increasing estradiol.

## 2018-11-26 MED FILL — LEVALBUTEROL HCL 1.25 MG/3M: 1.25 | 5 days supply | Qty: 90 | Fill #0

## 2019-02-01 ENCOUNTER — Other Ambulatory Visit: Payer: Self-pay

## 2019-02-01 ENCOUNTER — Encounter: Payer: Self-pay | Admitting: Family Medicine

## 2019-02-01 DIAGNOSIS — M542 Cervicalgia: Secondary | ICD-10-CM

## 2019-02-01 DIAGNOSIS — M25519 Pain in unspecified shoulder: Secondary | ICD-10-CM

## 2019-02-10 MED FILL — VIT D2 1.25 MG (50,000 UNIT: 1.25 MG | 84 days supply | Qty: 12 | Fill #2

## 2019-03-01 ENCOUNTER — Ambulatory Visit (INDEPENDENT_AMBULATORY_CARE_PROVIDER_SITE_OTHER): Payer: 59

## 2019-03-01 ENCOUNTER — Encounter: Payer: Self-pay | Admitting: Orthopaedic Surgery

## 2019-03-01 ENCOUNTER — Other Ambulatory Visit: Payer: Self-pay

## 2019-03-01 ENCOUNTER — Ambulatory Visit: Payer: 59 | Admitting: Orthopaedic Surgery

## 2019-03-01 VITALS — BP 141/96 | HR 82 | Ht 60.5 in | Wt 145.0 lb

## 2019-03-01 DIAGNOSIS — M542 Cervicalgia: Secondary | ICD-10-CM | POA: Diagnosis not present

## 2019-03-01 NOTE — Progress Notes (Signed)
Office Visit Note   Patient: Marissa Horn           Date of Birth: Dec 06, 1970           MRN: [id number] Visit Date: 03/01/2019              Requested by: Eulas Post, MD [place],  [place] [id number] PCP: Eulas Post, MD   Assessment & Plan: Visit Diagnoses:  1. Neck pain     Plan: Chronic recurrent cervical spine pain without radiculopathy.  X-rays demonstrate considerable osteoarthritis which I suspect is the cause of her problem.  There is reversal of the normal cervical lordosis.  I would like to obtain an MRI scan.  May continue with Advil ice and heat as she has been using in the past.  Would be a candidate for MRI scan assuming there are no significant abnormalities that would preclude her participating in exercises  Follow-Up Instructions: Return After MRI scan cervical spine.   Orders:  Orders Placed This Encounter  Procedures  . XR Cervical Spine 2 or 3 views   No orders of the defined types were placed in this encounter.     Procedures: No procedures performed   Clinical Data: No additional findings.   Subjective: Chief Complaint  Patient presents with  . Neck - Pain  Patient presents today with neck pain. She fell off a step one month ago and developed pain two days later. She has been unable to turn her head until two days ago. Her ability to turn her neck is very limited. She has pain that radiates from the back of her head and down her neck. The right side is worse than the left. She has went and tried to have it massaged out, but it did not help. She does not take anything for pain.  Has not experienced any numbness or tingling to either upper extremity. Has had chronic recurrent neck pain particularly posteriorly for "many years".  Recently had an exacerbation when she fell.  Since that time she has had difficulty with rotation of more to the right than the left.  Again, no numbness or tingling.  Pain seems to  migrate superiorly with associated with headaches.  No visual disturbances  HPI  Review of Systems   Objective: Vital Signs: BP (!) 141/96   Pulse 82   Ht 5' 0.5" (1.537 m)   Wt 145 lb (65.8 kg)   LMP 04/14/2012   BMI 27.85 kg/m   Physical Exam Constitutional:      Appearance: She is well-developed.  Eyes:     Pupils: Pupils are equal, round, and reactive to light.  Pulmonary:     Effort: Pulmonary effort is normal.  Skin:    General: Skin is warm and dry.  Neurological:     Mental Status: She is alert and oriented to person, place, and time.  Psychiatric:        Behavior: Behavior normal.     Ortho Exam awake alert and oriented x3.  Comfortable sitting.  Able to touch chin to her chest very slowly.  Has limited rotation more to the right than to the left with localized tenderness in the cervical spine.  No masses.  Very minimal tenderness posteriorly.  Had about 60 to 70% of neck extension without any referred pain.  Specialty Comments:  No specialty comments available.  Imaging: No results found.   PMFS History: Patient Active Problem List   Diagnosis  Date Noted  . Neck pain 03/01/2019  . Adult ADHD 02/17/2018  . 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 Vitamin D deficiency 07/25/2015  . Vitamin B 12 deficiency 07/25/2015  . Perennial allergic rhinitis 09/26/2014  . IBS (irritable bowel syndrome) 05/10/2014  . Other and unspecified hyperlipidemia 12/15/2012  . Overactive bladder 05/04/2012  . Asthma, moderate persistent 01/14/2012   Past Medical History:  Diagnosis Date  . Adult ADHD   . Allergy   . Anemia   . Arthritis    hands  . Asthma    ?, inhaler use as needed  . Complication of anesthesia 1990   muscle relaxant caused lung to collapse  . Difficult intubation 1990  . Dyslipidemia   . History of chicken pox   . IBS (irritable bowel syndrome)    ?  [name] Kitchen Migraine   . UTI (lower urinary tract infection)     Family History  Problem Relation  Age of Onset  . Arthritis Mother   . Hyperlipidemia Mother   . Glaucoma Mother   . Irritable bowel syndrome Mother   . Cataracts Mother   . Hyperlipidemia Father   . Heart disease Father 96       CAD  . Cataracts Father   . Thyroid disease Sister   . Hyperlipidemia Brother   . Ulcerative colitis Brother     Past Surgical History:  Procedure Laterality Date  . ABDOMINAL HYSTERECTOMY  05/2012  . ABDOMINOPLASTY  05/04/2012   Procedure: ABDOMINOPLASTY;  Surgeon: Crissie Reese, MD;  Location: [facility];  Service: Plastics;  Laterality: N/A;  . ANAL FISSURE REPAIR     age 46  . EYE SURGERY Left    age 66  . GYNECOLOGIC CRYOSURGERY    . WISDOM TOOTH EXTRACTION     Social History   Occupational History  . Occupation: clinical dietitian  Tobacco Use  . Smoking status: Never Smoker  . Smokeless tobacco: Never Used  Substance and Sexual Activity  . Alcohol use: No  . Drug use: No  . Sexual activity: Yes    Partners: Male    Birth control/protection: Surgical    Comment: hysterectomy

## 2019-03-01 NOTE — Addendum Note (Signed)
Addended by: Lendon Collar on: 03/01/2019 04:07 PM   Modules accepted: Orders

## 2019-03-22 ENCOUNTER — Ambulatory Visit: Payer: 59 | Admitting: Orthopaedic Surgery

## 2019-03-22 ENCOUNTER — Ambulatory Visit
Admission: RE | Admit: 2019-03-22 | Discharge: 2019-03-22 | Disposition: A | Discharge status: Home / Self Care | Payer: 59 | Source: Ambulatory Visit | Attending: Orthopaedic Surgery | Admitting: Orthopaedic Surgery

## 2019-03-22 DIAGNOSIS — M2578 Osteophyte, vertebrae: Secondary | ICD-10-CM | POA: Diagnosis not present

## 2019-03-22 DIAGNOSIS — M4802 Spinal stenosis, cervical region: Secondary | ICD-10-CM | POA: Diagnosis not present

## 2019-03-22 DIAGNOSIS — M542 Cervicalgia: Secondary | ICD-10-CM

## 2019-03-23 ENCOUNTER — Ambulatory Visit: Payer: 59 | Admitting: Orthopaedic Surgery

## 2019-03-23 ENCOUNTER — Encounter: Payer: Self-pay | Admitting: Orthopaedic Surgery

## 2019-03-23 ENCOUNTER — Other Ambulatory Visit: Payer: Self-pay

## 2019-03-23 VITALS — BP 133/90 | HR 80 | Ht 60.5 in | Wt 145.0 lb

## 2019-03-23 DIAGNOSIS — M542 Cervicalgia: Secondary | ICD-10-CM

## 2019-03-23 NOTE — Progress Notes (Signed)
Office Visit Note   Patient: Marissa Horn           Date of Birth: Oct 03, 1970           MRN: [id number] Visit Date: 03/23/2019              Requested by: Marissa Post, MD [place],  [place] [id number] PCP: Marissa Post, MD   Assessment & Plan: Visit Diagnoses:  1. Cervicalgia   2. Neck pain     Plan: MRI scan of cervical spine demonstrates diffuse areas of arthritis at virtually every level.  There is evidence of disc extrusion at C5-6 to the left and disc osteophyte complex at C4-5.  Either level could irritate either C5 or 6 nerve.  Marissa Horn has not experienced any radiculopathy to either upper extremity.  She is simply having a lot of neck pain and stiffness.  Most likely this is related to the arthritis.  Long discussion over half an hour regarding all of the above and treatment options.  We will try a course of physical therapy, okay for massage, heat or ice, Voltaren gel or Advil.  Office 1 month if no improvement.  Consider spine surgeon referral if no improvement.  Small focus of chronic myelomalacia at the level of C4-5.  Not sure how symptomatic that might be  Follow-Up Instructions: Return if symptoms worsen or fail to improve.   Orders:  Orders Placed This Encounter  Procedures   Ambulatory referral to Physical Therapy   No orders of the defined types were placed in this encounter.     Procedures: No procedures performed   Clinical Data: No additional findings.   Subjective: Chief Complaint  Patient presents with   Neck - Follow-up  Patient presents for a three week follow up on her neck pain. She had an MRI done on 03/22/19 and is here today for those results. She said that her neck is the same, no changes.   HPI  Review of Systems   Objective: Vital Signs: BP 133/90    Pulse 80    Ht 5' 0.5" (1.537 m)    Wt 145 lb (65.8 kg)    LMP 04/14/2012    BMI 27.85 kg/m   Physical Exam Constitutional:    Appearance: She is well-developed.  Eyes:     Pupils: Pupils are equal, round, and reactive to light.  Pulmonary:     Effort: Pulmonary effort is normal.  Skin:    General: Skin is warm and dry.  Neurological:     Mental Status: She is alert and oriented to person, place, and time.  Psychiatric:        Behavior: Behavior normal.     Ortho Exam awake alert and oriented x3.  Comfortable sitting.  Able to touch chin to chest.  Lacked about 30 degrees of full overhead motion.  Only had about 50% of normal rotation of the right the left.  No motion of the cervical spine created any discomfort to either shoulder or upper extremity.  Deep tendon reflexes were symmetrical.  Motor exam intact.  Does have an area of tenderness over the lateral epicondyles of the right elbow consistent with tennis elbow.  Pain with grip and extension.  Specialty Comments:  No specialty comments available.  Imaging: Mr Cervical Spine W/o Contrast  Result Date: 03/23/2019 CLINICAL DATA:  Initial evaluation for neck pain and stiffness, greater on the right. EXAM: MRI CERVICAL SPINE WITHOUT CONTRAST TECHNIQUE:  Multiplanar, multisequence MR imaging of the cervical spine was performed. No intravenous contrast was administered. COMPARISON:  None. FINDINGS: Alignment: Straightening of the normal cervical lordosis without listhesis or subluxation. Vertebrae: Vertebral body height maintained without evidence for acute or chronic fracture. Bone marrow signal intensity within normal limits. No discrete or worrisome osseous lesions. No abnormal marrow edema. Cord: Question tiny focus of T2 hyperintensity within the left hemi cord at the level of C4-5, suspicious for a small focus of chronic myelomalacia (series 5, image 14). Signal intensity within the cervical spinal cord otherwise normal. Posterior Fossa, vertebral arteries, paraspinal tissues: Craniocervical junction within normal limits. Retro cerebellar cyst versus mega cisterna  magna noted. Paraspinous soft tissues within normal limits. Normal intravascular flow voids seen within the vertebral arteries bilaterally. Disc levels: C2-C3: Mild left-sided facet degeneration. No canal or foraminal stenosis. C3-C4: Mild diffuse disc bulge with bilateral uncovertebral hypertrophy. No significant canal or foraminal stenosis. C4-C5: Chronic intervertebral disc space narrowing with diffuse degenerative disc osteophyte. Broad left paracentral component flattens and effaces the ventral thecal sac, greater on the left. Mild flattening of the left hemi cord. Mild bilateral C5 foraminal stenosis. C5-C6: Chronic intervertebral disc space narrowing with diffuse degenerative disc osteophyte. Superimposed left paracentral disc extrusion with slight superior migration (series 6, image 16). Protruding disc contacts and flattens the left hemi cord with resultant mild spinal stenosis. Moderate left C6 foraminal narrowing. C6-C7: Chronic intervertebral disc space narrowing with broad posterior disc osteophyte, slightly asymmetric to the right. Flattening and partial effacement of the ventral thecal sac with resultant mild spinal stenosis. Moderate right C7 foraminal narrowing. C7-T1:  Minimal disc bulge.  Mild facet hypertrophy.  No stenosis. Visualized upper thoracic spine demonstrates minor disc bulging at T1-2 and T2-3 without significant stenosis. IMPRESSION: 1. Left paracentral disc extrusion at C5-6 with secondary flattening of the left hemi cord, resulting in mild canal with moderate left C6 foraminal stenosis. 2. Left paracentral disc osteophyte at C4-5 with resultant mild spinal stenosis and flattening of the left hemi cord. 3. Right eccentric disc osteophyte at C6-7 with resultant mild canal and moderate right C7 foraminal stenosis. 4. Question tiny focus of chronic myelomalacia within the left hemi cord at the level of C4-5 as above. Electronically Signed   By: Jeannine Boga M.D.   On: 03/23/2019  04:46     PMFS History: Patient Active Problem List   Diagnosis Date Noted   Neck pain 03/01/2019   Adult ADHD 02/17/2018   Cough variant asthma vs VCD 01/01/2017   Vitamin D deficiency 07/25/2015   Vitamin B 12 deficiency 07/25/2015   Perennial allergic rhinitis 09/26/2014   IBS (irritable bowel syndrome) 05/10/2014   Other and unspecified hyperlipidemia 12/15/2012   Overactive bladder 05/04/2012   Asthma, moderate persistent 01/14/2012   Past Medical History:  Diagnosis Date   Adult ADHD    Allergy    Anemia    Arthritis    hands   Asthma    ?, inhaler use as needed   Complication of anesthesia 1990   muscle relaxant caused lung to collapse   Difficult intubation 1990   Dyslipidemia    History of chicken pox    IBS (irritable bowel syndrome)    ?   Migraine    UTI (lower urinary tract infection)     Family History  Problem Relation Age of Onset   Arthritis Mother    Hyperlipidemia Mother    Glaucoma Mother    Irritable bowel syndrome Mother  Cataracts Mother    Hyperlipidemia Father    Heart disease Father 58       CAD   Cataracts Father    Thyroid disease Sister    Hyperlipidemia Brother    Ulcerative colitis Brother     Past Surgical History:  Procedure Laterality Date   ABDOMINAL HYSTERECTOMY  05/2012   ABDOMINOPLASTY  05/04/2012   Procedure: ABDOMINOPLASTY;  Surgeon: Crissie Reese, MD;  Location: [facility];  Service: Plastics;  Laterality: N/A;   ANAL FISSURE REPAIR     age 31   EYE SURGERY Left    age 54   GYNECOLOGIC CRYOSURGERY     WISDOM TOOTH EXTRACTION     Social History   Occupational History   Occupation: clinical dietitian  Tobacco Use   Smoking status: Never Smoker   Smokeless tobacco: Never Used  Substance and Sexual Activity   Alcohol use: No   Drug use: No   Sexual activity: Yes    Partners: Male    Birth control/protection: Surgical    Comment: hysterectomy

## 2019-04-05 ENCOUNTER — Encounter: Payer: Self-pay | Admitting: Physical Therapy

## 2019-04-05 ENCOUNTER — Ambulatory Visit: Payer: 59 | Attending: Orthopaedic Surgery | Admitting: Physical Therapy

## 2019-04-05 ENCOUNTER — Other Ambulatory Visit: Payer: Self-pay

## 2019-04-05 DIAGNOSIS — M25522 Pain in left elbow: Secondary | ICD-10-CM | POA: Insufficient documentation

## 2019-04-05 DIAGNOSIS — M542 Cervicalgia: Secondary | ICD-10-CM | POA: Diagnosis not present

## 2019-04-05 DIAGNOSIS — M6281 Muscle weakness (generalized): Secondary | ICD-10-CM | POA: Insufficient documentation

## 2019-04-05 NOTE — Therapy (Signed)
[place], [place], [address] Phone: [phone]   Fax:  [phone]  Physical Therapy Evaluation  Patient Details  Name: Marissa Horn MRN: [id number] Date of Birth: 03/21/1971 Referring Provider (PT): Joni Fears MD   Encounter Date: 04/05/2019  PT End of Session - 04/05/19 1711    Visit Number  1    Number of Visits  13    Date for PT Re-Evaluation  05/17/19    Authorization Type  [facility]    PT Start Time  1115    PT Stop Time  1145    PT Time Calculation (min)  30 min    Activity Tolerance  Patient tolerated treatment well    Behavior During Therapy  [facility] for tasks assessed/performed       Past Medical History:  Diagnosis Date  . Adult ADHD   . Allergy   . Anemia   . Arthritis    hands  . Asthma    ?, inhaler use as needed  . Complication of anesthesia 1990   muscle relaxant caused lung to collapse  . Difficult intubation 1990  . Dyslipidemia   . History of chicken pox   . IBS (irritable bowel syndrome)    ?  [name] Kitchen Migraine   . UTI (lower urinary tract infection)     Past Surgical History:  Procedure Laterality Date  . ABDOMINAL HYSTERECTOMY  05/2012  . ABDOMINOPLASTY  05/04/2012   Procedure: ABDOMINOPLASTY;  Surgeon: Crissie Reese, MD;  Location: [facility];  Service: Plastics;  Laterality: N/Marissa;  . ANAL FISSURE REPAIR     age 4  . EYE SURGERY Left    age 33  . GYNECOLOGIC CRYOSURGERY    . WISDOM TOOTH EXTRACTION      There were no vitals filed for this visit.   Subjective Assessment - 04/05/19 1119    Subjective  I fell and slipped on stairs in home,  I dont normally fall. I am having trouble turning my neck to the RT, trouble driving    Pertinent History  IBS, neck pain chronic issue    Limitations  Reading;Other (comment)   sleeping   How long can you sit comfortably?  unlimited    How long can you stand comfortably?  unlimited    How long can you walk comfortably?   unlimited    Diagnostic tests  MRI    Patient Stated Goals  Be able to move neck,    Currently in Pain?  Yes    Pain Score  5     Pain Location  Neck    Pain Orientation  Right    Pain Descriptors / Indicators  Tightness    Pain Type  Chronic pain    Pain Radiating Towards  none    Pain Onset  More than Marissa month ago   3 months   Pain Frequency  Intermittent    Aggravating Factors   sleeping , driving only when moving    Multiple Pain Sites  Yes    Pain Score  5    Pain Location  Elbow    Pain Orientation  Left    Pain Descriptors / Indicators  Aching    Aggravating Factors   lifting , opening Marissa door  picking up something         [facility] PT Assessment - 04/05/19 1126      Assessment   Medical Diagnosis  Cervicalgia and left lateral epicondylitis  Referring Provider (PT)  Joni Fears MD    Onset Date/Surgical Date  12/03/18    Hand Dominance  Right;Left    Prior Therapy  none      Precautions   Precautions  None      Restrictions   Weight Bearing Restrictions  No      Balance Screen   Has the patient fallen in the past 6 months  Yes    How many times?  1    Has the patient had Marissa decrease in activity level because of Marissa fear of falling?   No    Is the patient reluctant to leave their home because of Marissa fear of falling?   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      Cognition   Overall Cognitive Status  Within Functional Limits for tasks assessed      Observation/Other Assessments   Focus on Therapeutic Outcomes (FOTO)   FOTO intake48% limitaiton 52% predicted 37%      Posture/Postural Control   Posture/Postural Control  Postural limitations    Postural Limitations  Rounded Shoulders;Forward head       ROM / Strength   AROM / PROM / Strength  AROM;Strength      AROM   Overall AROM   Deficits    AROM Assessment Site  Cervical;Elbow    Right Elbow Flexion  135    Right Elbow Extension  -3    Left Elbow Flexion  143    Left Elbow Extension  2    Cervical Flexion  40   pain   Cervical Extension  40    Cervical - Right Side Bend  30    Cervical - Left Side Bend  35    Cervical - Right Rotation  20    Cervical - Left Rotation  25      Strength   Overall Strength  Deficits    Overall Strength Comments  left wrist extension pain with index finger extended at lateral epicondyle  supine neck flex 1 inch from mat unable to hold for 2 seconds ( normal 35 seconds)    Right Shoulder Flexion  4+/5    Right Shoulder ABduction  4+/5    Right Shoulder Internal Rotation  4+/5    Right Shoulder External Rotation  4/5    Left Shoulder Flexion  4+/5    Left Shoulder ABduction  4+/5    Left Shoulder Internal Rotation  4/5    Left Shoulder External Rotation  4/5    Right Elbow Flexion  5/5    Right Elbow Extension  5/5    Left Elbow Flexion  4+/5    Left Elbow Extension  3-/5   pain with extension   Right Hand Grip (lbs)  46.6 lb   45, 45, 50   Left Hand Grip (lbs)  26 lb   29, 25, 24     Palpation   Palpation comment  tenderness over left elbow epicondylitis                Objective measurements  completed on examination: See above findings.      [facility] PT Treatment/Exercise - 04/05/19 1126      Self-Care   Self-Care  Other Self-Care Comments    Other Self-Care Comments   education on lateral epicondylitis and neck arthritis       Modalities   Modalities  Iontophoresis      Iontophoresis   Type of Iontophoresis  Dexamethasone    Location  left elbow lateral epicondyl    Dose  1cc    Time  4-6 hours patch to be removed by 4:00 pm today             PT Education - 04/05/19 1138    Education Details  POC Explanation of findings.  education on lateral  epicondylitis Left.  iontophoresis information and education    Person(s) Educated  Patient    Methods  Explanation    Comprehension  Verbalized understanding          PT Long Term Goals - 04/05/19 1653      PT LONG TERM GOAL #1   Title  Pt will be independent with advanced HEP    Baseline  limiited knowledge    Time  6    Period  Weeks    Status  New    Target Date  05/17/19      PT LONG TERM GOAL #2   Title  FOTO will improve from 53%limtiaiton   to 37% limitation    indicating improved functional mobility.    Baseline  limitation 53% at eval    Time  6    Period  Weeks    Status  New    Target Date  05/17/19      PT LONG TERM GOAL #3   Title  Pt will be able to turn head to at least 45 degrees of rotation in order to drive car safely    Baseline  Pt cervical Rotation Right 20 and left 25 degrees    Time  6    Period  Weeks    Status  New    Target Date  05/17/19      PT LONG TERM GOAL #4   Title  Pt will be able sleep and turn head without waking for 4 or more hours of restorative sleep    Baseline  Pt wakes when turning every time at night    Time  6    Period  Weeks    Status  New    Target Date  05/17/19      PT LONG TERM GOAL #5   Title  Pt will be able to grip dooknob without exacerbating pain in left hand and have symmetrical grip strenght within 7 lbs    Baseline  Grip strenth Left 26 lb and Right 46 lb    Time  6    Period  Weeks    Status  New    Target Date  05/17/19             Plan - 04/05/19 1711    Clinical Impression Statement  48 yo with cervical pain and AROM limitation since May due to fall on steps of home getting towel for son on wet area.  Pt also presents with Left lateral epicondylitis and increased pain with wrist extension at site of left lateral eipicondyl.  Pt with pain with cervical rotation bilaterally and AROM to right 20 degrees and to LT 25 degrees.  Pt also  has decreased grip strenght of 26 lb in left hand(due to pain in  elbow) and inabliity to turn doorknobs and 46 lb in right.  Marissa Horn has difficulty driving and sleeping due to decresed AROM in neck.  Pt will benefit form skilled PT to address impairments in strength, AROM, pain and benefit form 2 x Marissa week for 6 weeks.    Personal Factors and Comorbidities  Comorbidity 1    Comorbidities  arthritis/neck pain/ migraines    Examination-Activity Limitations  Sleep;Reach Overhead;Lift;Carry    Examination-Participation Restrictions  Cleaning;Laundry;Meal Prep    Stability/Clinical Decision Making  Stable/Uncomplicated    Clinical Decision Making  Low    Rehab Potential  Good    PT Frequency  2x / week    PT Duration  6 weeks    PT Treatment/Interventions  ADLs/Self Care Home Management;Cryotherapy;Iontophoresis 75m/ml Dexamethasone;Moist Heat;Ultrasound;Neuromuscular re-education;Therapeutic exercise;Therapeutic activities;Patient/family education;Manual techniques;Dry needling;Passive range of motion;Joint Manipulations;Spinal Manipulations    PT Next Visit Plan  Given HEP for neck strength, check iontophoresis for left tennis elbow  Give HEP for Tennis elbow    Consulted and Agree with Plan of Care  Patient       Patient will benefit from skilled therapeutic intervention in order to improve the following deficits and impairments:  Pain, Impaired UE functional use, Decreased strength, Decreased range of motion, Postural dysfunction  Visit Diagnosis: Cervicalgia  Pain in left elbow  Muscle weakness (generalized)     Problem List Patient Active Problem List   Diagnosis Date Noted  . Neck pain 03/01/2019  . Adult ADHD 02/17/2018  . 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 Vitamin D deficiency 07/25/2015  . Vitamin B 12 deficiency 07/25/2015  . Perennial allergic rhinitis 09/26/2014  . IBS (irritable bowel syndrome) 05/10/2014  . Other and unspecified hyperlipidemia 12/15/2012  . Overactive bladder 05/04/2012  . Asthma, moderate persistent  01/14/2012    LVoncille Lo PT Certified Exercise Expert for the Aging Adult  04/05/19 5:20 PM Phone: [phone]: 3[facility] [address]Phone: [phone]  Fax:  [phone] Name: Marissa CreweMRN: [id number] of Birth: [birth year]

## 2019-04-05 NOTE — Patient Instructions (Signed)

## 2019-04-06 ENCOUNTER — Encounter: Payer: Self-pay | Admitting: Physical Therapy

## 2019-04-06 ENCOUNTER — Ambulatory Visit: Payer: 59 | Admitting: Physical Therapy

## 2019-04-06 DIAGNOSIS — M542 Cervicalgia: Secondary | ICD-10-CM

## 2019-04-06 DIAGNOSIS — M25522 Pain in left elbow: Secondary | ICD-10-CM | POA: Diagnosis not present

## 2019-04-06 DIAGNOSIS — M6281 Muscle weakness (generalized): Secondary | ICD-10-CM | POA: Diagnosis not present

## 2019-04-06 NOTE — Therapy (Signed)
[place], [place], [address] Phone: [phone]   Fax:  [phone]  Physical Therapy Treatment  Patient Details  Name: Marissa Horn MRN: [id number] Date of Birth: 1971-07-29 Referring Provider (PT): Joni Fears MD   Encounter Date: 04/06/2019  PT End of Session - 04/06/19 1150    Visit Number  2    Number of Visits  13    Date for PT Re-Evaluation  05/17/19    Authorization Type  UMR    PT Start Time  1100    PT Stop Time  1147    PT Time Calculation (min)  47 min    Activity Tolerance  Patient tolerated treatment well    Behavior During Therapy  [facility] for tasks assessed/performed       Past Medical History:  Diagnosis Date  . Adult ADHD   . Allergy   . Anemia   . Arthritis    hands  . Asthma    ?, inhaler use as needed  . Complication of anesthesia 1990   muscle relaxant caused lung to collapse  . Difficult intubation 1990  . Dyslipidemia   . History of chicken pox   . IBS (irritable bowel syndrome)    ?  [name] Kitchen Migraine   . UTI (lower urinary tract infection)     Past Surgical History:  Procedure Laterality Date  . ABDOMINAL HYSTERECTOMY  05/2012  . ABDOMINOPLASTY  05/04/2012   Procedure: ABDOMINOPLASTY;  Surgeon: Crissie Reese, MD;  Location: [facility];  Service: Plastics;  Laterality: N/A;  . ANAL FISSURE REPAIR     age 48  . EYE SURGERY Left    age 48  . GYNECOLOGIC CRYOSURGERY    . WISDOM TOOTH EXTRACTION      There were no vitals filed for this visit.  Subjective Assessment - 04/06/19 1101    Subjective  Better and slept well last night. Radiates into head. Elbow is radiating to hand.    Currently in Pain?  Yes    Pain Score  3          [facility] PT Assessment - 04/06/19 0001      AROM   Cervical - Right Rotation  52                   [facility] Adult PT Treatment/Exercise - 04/06/19 0001      Modalities   Modalities  Moist Heat;Cryotherapy      Moist  Heat Therapy   Number Minutes Moist Heat  10 Minutes   concurrent with ice & education   Moist Heat Location  Shoulder   Rt     Cryotherapy   Number Minutes Cryotherapy  10 Minutes   concurrent with heat & education   Cryotherapy Location  Forearm   Lt elbow   Type of Cryotherapy  Ice pack      Iontophoresis   Type of Iontophoresis  Dexamethasone    Location  left elbow lateral epicondyl    Dose  1cc    Time  6 hr patch      Manual Therapy   Manual Therapy  Soft tissue mobilization;Joint mobilization;Manual Traction;Passive ROM    Manual therapy comments  skilled palpation and monitoring during TPDN    Joint Mobilization  prone C7-T2 PA, Rt first rib mobilization with breathing    Soft tissue mobilization  Rt upper trap, levator; suboccipital release    Passive ROM  cervical rotation paired with traction  Manual Traction  cervical 3x30s       Trigger Point Dry Needling - 04/06/19 0001    Consent Given?  Yes    Education Handout Provided  --   verbal education   Muscles Treated Head and Neck  Upper trapezius;Levator scapulae    Upper Trapezius Response  Twitch reponse elicited;Palpable increased muscle length   Right   Levator Scapulae Response  Twitch response elicited;Palpable increased muscle length   Right          PT Education - 04/06/19 1159    Education Details  TPDN & expected outcomes, anatomy of condition, POC    Person(s) Educated  Patient    Methods  Explanation;Handout;Demonstration;Verbal cues    Comprehension  Verbalized understanding;Returned demonstration;Verbal cues required;Need further instruction          PT Long Term Goals - 04/05/19 1653      PT LONG TERM GOAL #1   Title  Pt will be independent with advanced HEP    Baseline  limiited knowledge    Time  6    Period  Weeks    Status  New    Target Date  05/17/19      PT LONG TERM GOAL #2   Title  FOTO will improve from 53%limtiaiton   to 37% limitation    indicating improved  functional mobility.    Baseline  limitation 53% at eval    Time  6    Period  Weeks    Status  New    Target Date  05/17/19      PT LONG TERM GOAL #3   Title  Pt will be able to turn head to at least 45 degrees of rotation in order to drive car safely    Baseline  Pt cervical Rotation Right 20 and left 25 degrees    Time  6    Period  Weeks    Status  New    Target Date  05/17/19      PT LONG TERM GOAL #4   Title  Pt will be able sleep and turn head without waking for 4 or more hours of restorative sleep    Baseline  Pt wakes when turning every time at night    Time  6    Period  Weeks    Status  New    Target Date  05/17/19      PT LONG TERM GOAL #5   Title  Pt will be able to grip dooknob without exacerbating pain in left hand and have symmetrical grip strenght within 7 lbs    Baseline  Grip strenth Left 26 lb and Right 46 lb    Time  6    Period  Weeks    Status  New    Target Date  05/17/19            Plan - 04/06/19 1153    Clinical Impression Statement  Concordant pain found in  trigger points of Right upper trap and levator that were reduced with DN. Advised pt that she will be sore and it is important to keep moving/stretching/using new available ROM.Increased rotation from 20 to52 deg at end of treatment today. some tingling under ionto patch reduced with ice pack placement.    PT Treatment/Interventions  ADLs/Self Care Home Management;Cryotherapy;Iontophoresis 2m/ml Dexamethasone;Moist Heat;Ultrasound;Neuromuscular re-education;Therapeutic exercise;Therapeutic activities;Patient/family education;Manual techniques;Dry needling;Passive range of motion;Joint Manipulations;Spinal Manipulations    PT Next Visit Plan  wrist ext eccentrics. DN PRN  PT Home Exercise Plan  wrist flx/ext stretch, upper trap & levator stretch    Consulted and Agree with Plan of Care  Patient       Patient will benefit from skilled therapeutic intervention in order to improve the  following deficits and impairments:  Pain, Impaired UE functional use, Decreased strength, Decreased range of motion, Postural dysfunction  Visit Diagnosis: Cervicalgia  Pain in left elbow  Muscle weakness (generalized)     Problem List Patient Active Problem List   Diagnosis Date Noted  . Neck pain 03/01/2019  . Adult ADHD 02/17/2018  . 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 Vitamin D deficiency 07/25/2015  . Vitamin B 12 deficiency 07/25/2015  . Perennial allergic rhinitis 09/26/2014  . IBS (irritable bowel syndrome) 05/10/2014  . Other and unspecified hyperlipidemia 12/15/2012  . Overactive bladder 05/04/2012  . Asthma, moderate persistent 01/14/2012    Hrishikesh Hoeg C. Creta Dorame PT, DPT 04/06/19 11:59 AM   [facility] [address] [place], [place], [address] Phone: [phone]   Fax:  [phone]  Name: Marissa Horn MRN: [id number] Date of Birth: April 04, 1971

## 2019-04-14 ENCOUNTER — Encounter: Payer: Self-pay | Admitting: Family Medicine

## 2019-04-19 ENCOUNTER — Encounter: Payer: Self-pay | Admitting: Family Medicine

## 2019-04-19 ENCOUNTER — Encounter: Payer: Self-pay | Admitting: Physical Therapy

## 2019-04-19 ENCOUNTER — Ambulatory Visit: Payer: 59 | Admitting: Physical Therapy

## 2019-04-19 ENCOUNTER — Other Ambulatory Visit: Payer: Self-pay

## 2019-04-19 DIAGNOSIS — M25522 Pain in left elbow: Secondary | ICD-10-CM

## 2019-04-19 DIAGNOSIS — M542 Cervicalgia: Secondary | ICD-10-CM

## 2019-04-19 DIAGNOSIS — M6281 Muscle weakness (generalized): Secondary | ICD-10-CM | POA: Diagnosis not present

## 2019-04-19 MED ORDER — DEXMETHYLPHENIDATE HCL ER 5 MG PO CP24: 5.0000 mg | ORAL_CAPSULE | Freq: Every day | ORAL | 0 refills | Status: DC

## 2019-04-19 MED FILL — DEXMETHYLPHENIDATE ER 5 MG: 5 | 30 days supply | Qty: 30 | Fill #0

## 2019-04-19 NOTE — Therapy (Signed)
[place], [place], [address] Phone: [phone]   Fax:  [phone]  Physical Therapy Treatment  Patient Details  Name: Marissa Horn MRN: [id number] Date of Birth: 01-02-1971 Referring Provider (PT): Joni Fears MD   Encounter Date: 04/19/2019  PT End of Session - 04/19/19 0853    Visit Number  3    Number of Visits  13    Date for PT Re-Evaluation  05/17/19    Authorization Type  UMR    PT Start Time  0848    PT Stop Time  0928    PT Time Calculation (min)  40 min    Activity Tolerance  Patient tolerated treatment well    Behavior During Therapy  [facility] for tasks assessed/performed       Past Medical History:  Diagnosis Date  . Adult ADHD   . Allergy   . Anemia   . Arthritis    hands  . Asthma    ?, inhaler use as needed  . Complication of anesthesia 1990   muscle relaxant caused lung to collapse  . Difficult intubation 1990  . Dyslipidemia   . History of chicken pox   . IBS (irritable bowel syndrome)    ?  [name] Kitchen Migraine   . UTI (lower urinary tract infection)     Past Surgical History:  Procedure Laterality Date  . ABDOMINAL HYSTERECTOMY  05/2012  . ABDOMINOPLASTY  05/04/2012   Procedure: ABDOMINOPLASTY;  Surgeon: Crissie Reese, MD;  Location: [facility];  Service: Plastics;  Laterality: N/A;  . ANAL FISSURE REPAIR     age 29  . EYE SURGERY Left    age 53  . GYNECOLOGIC CRYOSURGERY    . WISDOM TOOTH EXTRACTION      There were no vitals filed for this visit.  Subjective Assessment - 04/19/19 0850    Subjective  I can sleep, improved movement. Still have a discomfort at base of head. My elbow is the worst, I have a lot of weakness.    Patient Stated Goals  Be able to move neck,    Currently in Pain?  Yes    Pain Score  2     Pain Location  Neck    Pain Orientation  Left    Pain Descriptors / Indicators  Tightness;Sore    Aggravating Factors   looking Left    Pain Score   7    Pain Location  Elbow    Pain Orientation  Left;Lateral    Pain Descriptors / Indicators  Aching    Aggravating Factors   grabbing                       [facility] Adult PT Treatment/Exercise - 04/19/19 0001      Exercises   Exercises  Wrist;Other Exercises    Other Exercises   scalene stretch      Wrist Exercises   Wrist Extension Limitations  passive extension to eccentric flx    Other wrist exercises  flexion stretch      Iontophoresis   Type of Iontophoresis  Dexamethasone    Location  left elbow lateral epicondyl    Dose  1cc    Time  6 hr patch      Manual Therapy   Manual therapy comments  skilled palpation and monitoring during TPDN    Soft tissue mobilization  IASTM Lt wrist extensors, Lt upper trap, Lt scalenes  Passive ROM  Cervical rotation & SB to Rt       Trigger Point Dry Needling - 04/19/19 0001    Muscles Treated Wrist/Hand  Extensor carpi radialis longus/brevis;Extensor digitorum;Extensor carpi ulnaris    Upper Trapezius Response  Twitch reponse elicited;Palpable increased muscle length   Left   Extensor carpi radialis longus/brevis Response  Twitch response elicited;Palpable increased muscle length   LEft   Extensor digitorum Response  Twitch response elicited;Palpable increased muscle length   left   Extensor carpi ulnaris Response  Twitch response elicited;Palpable increased muscle length   Left               PT Long Term Goals - 04/05/19 1653      PT LONG TERM GOAL #1   Title  Pt will be independent with advanced HEP    Baseline  limiited knowledge    Time  6    Period  Weeks    Status  New    Target Date  05/17/19      PT LONG TERM GOAL #2   Title  FOTO will improve from 53%limtiaiton   to 37% limitation    indicating improved functional mobility.    Baseline  limitation 53% at eval    Time  6    Period  Weeks    Status  New    Target Date  05/17/19      PT LONG TERM GOAL #3   Title  Pt will be able to turn  head to at least 45 degrees of rotation in order to drive car safely    Baseline  Pt cervical Rotation Right 20 and left 25 degrees    Time  6    Period  Weeks    Status  New    Target Date  05/17/19      PT LONG TERM GOAL #4   Title  Pt will be able sleep and turn head without waking for 4 or more hours of restorative sleep    Baseline  Pt wakes when turning every time at night    Time  6    Period  Weeks    Status  New    Target Date  05/17/19      PT LONG TERM GOAL #5   Title  Pt will be able to grip dooknob without exacerbating pain in left hand and have symmetrical grip strenght within 7 lbs    Baseline  Grip strenth Left 26 lb and Right 46 lb    Time  6    Period  Weeks    Status  New    Target Date  05/17/19            Plan - 04/19/19 5053    Clinical Impression Statement  Pt reports increased soreness in forearm after DN and we discussed that she should expect to be sore for a day or two. Improved ability to make a fist and engage strength even with soreness. Tightness noted in Lt scalenes and anterior fibers of upper trap on Left side.    PT Treatment/Interventions  ADLs/Self Care Home Management;Cryotherapy;Iontophoresis 61m/ml Dexamethasone;Moist Heat;Ultrasound;Neuromuscular re-education;Therapeutic exercise;Therapeutic activities;Patient/family education;Manual techniques;Dry needling;Passive range of motion;Joint Manipulations;Spinal Manipulations    PT Next Visit Plan  outcome of treatment to Lt forearm? periscap activation, review scalene stretch    PT Home Exercise Plan  wrist flx/ext stretch, upper trap & levator stretch, passive wrist ext+eccentric lower, scalene stretch    Consulted and Agree with  Plan of Care  Patient       Patient will benefit from skilled therapeutic intervention in order to improve the following deficits and impairments:  Pain, Impaired UE functional use, Decreased strength, Decreased range of motion, Postural dysfunction  Visit  Diagnosis: Cervicalgia  Pain in left elbow  Muscle weakness (generalized)     Problem List Patient Active Problem List   Diagnosis Date Noted  . Neck pain 03/01/2019  . Adult ADHD 02/17/2018  . 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 Vitamin D deficiency 07/25/2015  . Vitamin B 12 deficiency 07/25/2015  . Perennial allergic rhinitis 09/26/2014  . IBS (irritable bowel syndrome) 05/10/2014  . Other and unspecified hyperlipidemia 12/15/2012  . Overactive bladder 05/04/2012  . Asthma, moderate persistent 01/14/2012   Cathline Dowen C. Deion Forgue PT, DPT 04/19/19 10:14 AM   [facility] [address] [place], [place], [address] Phone: [phone]   Fax:  [phone]  Name: Marissa Horn MRN: [id number] Date of Birth: 10/19/1970

## 2019-04-19 NOTE — Telephone Encounter (Signed)
Patient called requesting change from Focalin immediate release to Focalin XR.  She is only taking 5 mg once daily and is requesting this dosage.  She had been much more focused at this dose.  We sent in a prescription for Focalin XR 5 mg #30 to [facility]

## 2019-04-22 ENCOUNTER — Encounter: Payer: Self-pay | Admitting: Physical Therapy

## 2019-04-22 ENCOUNTER — Other Ambulatory Visit: Payer: Self-pay

## 2019-04-22 ENCOUNTER — Ambulatory Visit: Payer: 59 | Admitting: Physical Therapy

## 2019-04-22 DIAGNOSIS — M542 Cervicalgia: Secondary | ICD-10-CM | POA: Diagnosis not present

## 2019-04-22 DIAGNOSIS — M6281 Muscle weakness (generalized): Secondary | ICD-10-CM

## 2019-04-22 DIAGNOSIS — M25522 Pain in left elbow: Secondary | ICD-10-CM | POA: Diagnosis not present

## 2019-04-22 NOTE — Therapy (Signed)
[place], [place], [address] Phone: [phone]   Fax:  [phone]  Physical Therapy Treatment  Patient Details  Name: Marissa Horn MRN: [id number] Date of Birth: Nov 14, 1970 Referring Provider (PT): Joni Fears MD   Encounter Date: 04/22/2019  PT End of Session - 04/22/19 1157    Visit Number  4    Number of Visits  13    Date for PT Re-Evaluation  05/17/19    Authorization Type  UMR    PT Start Time  0932    PT Stop Time  1016    PT Time Calculation (min)  44 min    Activity Tolerance  Patient tolerated treatment well    Behavior During Therapy  [facility] for tasks assessed/performed       Past Medical History:  Diagnosis Date  . Adult ADHD   . Allergy   . Anemia   . Arthritis    hands  . Asthma    ?, inhaler use as needed  . Complication of anesthesia 1990   muscle relaxant caused lung to collapse  . Difficult intubation 1990  . Dyslipidemia   . History of chicken pox   . IBS (irritable bowel syndrome)    ?  [name] Kitchen Migraine   . UTI (lower urinary tract infection)     Past Surgical History:  Procedure Laterality Date  . ABDOMINAL HYSTERECTOMY  05/2012  . ABDOMINOPLASTY  05/04/2012   Procedure: ABDOMINOPLASTY;  Surgeon: Crissie Reese, MD;  Location: [facility];  Service: Plastics;  Laterality: N/A;  . ANAL FISSURE REPAIR     age 87  . EYE SURGERY Left    age 36  . GYNECOLOGIC CRYOSURGERY    . WISDOM TOOTH EXTRACTION      There were no vitals filed for this visit.  Subjective Assessment - 04/22/19 0937    Subjective  I am still able to sleep since the first time I did evaluation. Still on my left side base of skull.    Pertinent History  IBS, neck pain chronic issue    Limitations  Reading;Other (comment)    How long can you sit comfortably?  unlimited    How long can you stand comfortably?  unlimited    How long can you walk comfortably?  unlimited    Diagnostic tests  MRI    Patient Stated Goals  Be able to move neck,    Currently in Pain?  Yes    Pain Score  2     Pain Location  Neck    Pain Orientation  Left    Pain Descriptors / Indicators  Aching;Dull    Pain Onset  More than a month ago    Multiple Pain Sites  Yes    Pain Score  5    Pain Location  Elbow         [facility] PT Assessment - 04/22/19 0001      Assessment   Medical Diagnosis  Cervicalgia and left lateral epicondylitis      AROM   Cervical - Right Rotation  54    Cervical - Left Rotation  57                   [facility] Adult PT Treatment/Exercise - 04/22/19 0001      Exercises   Exercises  Wrist;Other Exercises    Other Exercises   scalene stretch      Wrist Exercises   Wrist Extension Limitations  passive extension  wrist ext isometric 5 sec x 10    Wrist Radial Deviation Limitations  wrist isometric 5 sec hold x 10    Other wrist exercises  flexion stretch    Other wrist exercises  isometric pronation and supination isometric 5 sec hold x 10 reps each      Modalities   Modalities  Iontophoresis      Iontophoresis   Type of Iontophoresis  Dexamethasone    Location  left elbow lateral epicondyl    Dose  1cc    Time  6 hr patch      Manual Therapy   Manual Therapy  Soft tissue mobilization;Joint mobilization;Passive ROM    Joint Mobilization  prone C7-T2 PA, Rt first rib mobilization with breathing left side UPA at C-2/3  supine lateral glide left and cervical distraction and over pressure for cervical rotation    Soft tissue mobilization  R wrist extensor carpi longus and brevis transverse friction massage    Passive ROM  Cervical rotation & SB to Rt             PT Education - 04/22/19 0957    Education Details  Added isometric to HEP and scalene stretch    Person(s) Educated  Patient    Methods  Explanation;Demonstration;Tactile cues;Verbal cues;Handout    Comprehension  Verbalized understanding;Returned demonstration          PT Long Term Goals -  04/22/19 1203      PT LONG TERM GOAL #1   Title  Pt will be independent with advanced HEP    Baseline  independent with initial HEP    Time  6    Period  Weeks    Target Date  05/17/19      PT LONG TERM GOAL #2   Title  FOTO will improve from 53%limtiaiton   to 37% limitation    indicating improved functional mobility.    Baseline  limitation 53% at eval    Time  6    Period  Weeks    Status  On-going    Target Date  05/17/19      PT LONG TERM GOAL #3   Title  Pt will be able to turn head to at least 45 degrees of rotation in order to drive car safely    Baseline  Pt cervical Rotation Right 52 and left 57 degrees    Time  6    Period  Weeks    Status  Achieved    Target Date  05/17/19      PT LONG TERM GOAL #4   Title  Pt will be able sleep and turn head without waking for 4 or more hours of restorative sleep    Baseline  Pt sleeping 50% better    Time  6    Period  Weeks    Status  On-going    Target Date  05/17/19      PT LONG TERM GOAL #5   Title  Pt will be able to grip dooknob without exacerbating pain in left hand and have symmetrical grip strenght within 7 lbs    Baseline  Grip strenth Left 26 lb and Right 46 lb eval    Time  6    Period  Weeks    Status  On-going    Target Date  05/17/19            Plan - 04/22/19 1100    Clinical Impression Statement  Pt declined TPDN today.  Pt recieved Manual PT for neck and joint mobiliization especiall to C2/C3 area.  Pt instructed in HEP for isometrics of right wrist due to soreness and sensitivity.  Pt with increased Cervical AROM after RX  to RT 54 degrees and LT 57    Personal Factors and Comorbidities  Comorbidity 1    Comorbidities  arthritis/neck pain/ migraines    Examination-Activity Limitations  Sleep;Reach Overhead;Lift;Carry    Examination-Participation Restrictions  Cleaning;Laundry;Meal Prep    Stability/Clinical Decision Making  Stable/Uncomplicated    Rehab Potential  Good    PT Frequency  2x / week     PT Duration  6 weeks    PT Treatment/Interventions  ADLs/Self Care Home Management;Cryotherapy;Iontophoresis 18m/ml Dexamethasone;Moist Heat;Ultrasound;Neuromuscular re-education;Therapeutic exercise;Therapeutic activities;Patient/family education;Manual techniques;Dry needling;Passive range of motion;Joint Manipulations;Spinal Manipulations    PT Next Visit Plan  DC ionto if no improvement  periscap activation    PT Home Exercise Plan  wrist flx/ext stretch, upper trap & levator stretch, passive wrist ext+eccentric lower, scalene stretch, isometric wrist pronation/ ext and radial deviation    Consulted and Agree with Plan of Care  Patient       Patient will benefit from skilled therapeutic intervention in order to improve the following deficits and impairments:  Pain, Impaired UE functional use, Decreased strength, Decreased range of motion, Postural dysfunction  Visit Diagnosis: Cervicalgia  Pain in left elbow  Muscle weakness (generalized)     Problem List Patient Active Problem List   Diagnosis Date Noted  . Neck pain 03/01/2019  . Adult ADHD 02/17/2018  . 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 Vitamin D deficiency 07/25/2015  . Vitamin B 12 deficiency 07/25/2015  . Perennial allergic rhinitis 09/26/2014  . IBS (irritable bowel syndrome) 05/10/2014  . Other and unspecified hyperlipidemia 12/15/2012  . Overactive bladder 05/04/2012  . Asthma, moderate persistent 01/14/2012    LVoncille Lo PT Certified Exercise Expert for the Aging Adult  04/22/19 12:14 PM Phone: [phone]: 3[facility]GMent[address] [place] [address]Phone: [phone]  Fax:  [phone] Name: AAquita SimmeringMRN: [id number] of Birth: 6June 04, 1972

## 2019-04-22 NOTE — Patient Instructions (Signed)
          Voncille Lo, PT Certified Exercise Expert for the Aging Adult  04/22/19 9:57 AM Phone: [phone] Fax: [phone]

## 2019-04-26 ENCOUNTER — Ambulatory Visit: Payer: 59 | Admitting: Physical Therapy

## 2019-04-26 ENCOUNTER — Other Ambulatory Visit: Payer: Self-pay

## 2019-04-26 ENCOUNTER — Encounter: Payer: Self-pay | Admitting: Physical Therapy

## 2019-04-26 DIAGNOSIS — M6281 Muscle weakness (generalized): Secondary | ICD-10-CM

## 2019-04-26 DIAGNOSIS — M25522 Pain in left elbow: Secondary | ICD-10-CM | POA: Diagnosis not present

## 2019-04-26 DIAGNOSIS — M542 Cervicalgia: Secondary | ICD-10-CM | POA: Diagnosis not present

## 2019-04-26 NOTE — Therapy (Signed)
[place], [place], [address] Phone: [phone]   Fax:  [phone]  Physical Therapy Treatment  Patient Details  Name: Marissa Horn MRN: [id number] Date of Birth: June 06, 1971 Referring Provider (PT): Marissa Fears MD   Encounter Date: 04/26/2019  PT End of Session - 04/26/19 1148    Visit Number  5    Number of Visits  13    Date for PT Re-Evaluation  05/17/19    Authorization Type  UMR    PT Start Time  1102    PT Stop Time  1148    PT Time Calculation (min)  46 min    Activity Tolerance  Patient tolerated treatment well    Behavior During Therapy  [facility] for tasks assessed/performed       Past Medical History:  Diagnosis Date  . Adult ADHD   . Allergy   . Anemia   . Arthritis    hands  . Asthma    ?, inhaler use as needed  . Complication of anesthesia 1990   muscle relaxant caused lung to collapse  . Difficult intubation 1990  . Dyslipidemia   . History of chicken pox   . IBS (irritable bowel syndrome)    ?  [name] Kitchen Migraine   . UTI (lower urinary tract infection)     Past Surgical History:  Procedure Laterality Date  . ABDOMINAL HYSTERECTOMY  05/2012  . ABDOMINOPLASTY  05/04/2012   Procedure: ABDOMINOPLASTY;  Surgeon: Marissa Reese, MD;  Location: [facility];  Service: Plastics;  Laterality: N/A;  . ANAL FISSURE REPAIR     age 2  . EYE SURGERY Left    age 87  . GYNECOLOGIC CRYOSURGERY    . WISDOM TOOTH EXTRACTION      There were no vitals filed for this visit.  Subjective Assessment - 04/26/19 1106    Subjective  Marissa Horn worked on my neck. I have had huge progress and can sleep at night. My elbow still hurts, I have a spider bite on the elbow also and the patch irritates it.    Patient Stated Goals  Be able to move neck,    Currently in Pain?  Yes    Pain Score  3     Pain Location  Neck    Pain Orientation  Left    Pain Descriptors / Indicators  Shooting    Pain Score  3     Pain Location  Elbow    Pain Orientation  Left;Lateral    Pain Descriptors / Indicators  --   weakness, up to 6 when holding something        [facility] PT Assessment - 04/26/19 0001      Strength   Left Hand Grip (lbs)  25   25 25 25                   [facility] Adult PT Treatment/Exercise - 04/26/19 0001      Wrist Exercises   Wrist Extension Limitations  passive extension with eccentric flexion, 3lb    Other wrist exercises  flexion stretch      Modalities   Modalities  Traction      Moist Heat Therapy   Number Minutes Moist Heat  10 Minutes    Moist Heat Location  Elbow      Traction   Type of Traction  Cervical   concurrent with IASTM   Min (lbs)  5    Max (lbs)  10    Hold Time  60    Rest Time  10    Time  10      Manual Therapy   Soft tissue mobilization  IASTM wrist extensors   concurrent with traction            PT Education - 04/26/19 1819    Education Details  ionto, traction, possibility of cervical contribution to grip strength loss    Person(s) Educated  Patient    Methods  Explanation    Comprehension  Verbalized understanding          PT Long Term Goals - 04/22/19 1203      PT LONG TERM GOAL #1   Title  Pt will be independent with advanced HEP    Baseline  independent with initial HEP    Time  6    Period  Weeks    Target Date  05/17/19      PT LONG TERM GOAL #2   Title  FOTO will improve from 53%limtiaiton   to 37% limitation    indicating improved functional mobility.    Baseline  limitation 53% at eval    Time  6    Period  Weeks    Status  On-going    Target Date  05/17/19      PT LONG TERM GOAL #3   Title  Pt will be able to turn head to at least 45 degrees of rotation in order to drive car safely    Baseline  Pt cervical Rotation Right 52 and left 57 degrees    Time  6    Period  Weeks    Status  Achieved    Target Date  05/17/19      PT LONG TERM GOAL #4   Title  Pt will be able sleep and turn head  without waking for 4 or more hours of restorative sleep    Baseline  Pt sleeping 50% better    Time  6    Period  Weeks    Status  On-going    Target Date  05/17/19      PT LONG TERM GOAL #5   Title  Pt will be able to grip dooknob without exacerbating pain in left hand and have symmetrical grip strenght within 7 lbs    Baseline  Grip strenth Left 26 lb and Right 46 lb eval    Time  6    Period  Weeks    Status  On-going    Target Date  05/17/19            Plan - 04/26/19 1113    Clinical Impression Statement  With compression of Rt cervical incr radicular symtpoms down arm but did not change grip, cervical distraction improved radicular symptoms and decreased elbow pain while improving grip. Utilized cervical traction while doing IASTM on wrist extensor group and pt reported feeling an improvement in grip strength even though she still had some pain at her elbow.    PT Treatment/Interventions  ADLs/Self Care Home Management;Cryotherapy;Iontophoresis 78m/ml Dexamethasone;Moist Heat;Ultrasound;Neuromuscular re-education;Therapeutic exercise;Therapeutic activities;Patient/family education;Manual techniques;Dry needling;Passive range of motion;Joint Manipulations;Spinal Manipulations    PT Next Visit Plan  outcome of traction? prone periscap    PT Home Exercise Plan  wrist flx/ext stretch, upper trap & levator stretch, passive wrist ext+eccentric lower, scalene stretch, isometric wrist pronation/ ext and radial deviation    Consulted and Agree with Plan of Care  Patient  Patient will benefit from skilled therapeutic intervention in order to improve the following deficits and impairments:  Pain, Impaired UE functional use, Decreased strength, Decreased range of motion, Postural dysfunction  Visit Diagnosis: Cervicalgia  Pain in left elbow  Muscle weakness (generalized)     Problem List Patient Active Problem List   Diagnosis Date Noted  . Neck pain 03/01/2019  . Adult  ADHD 02/17/2018  . 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 Vitamin D deficiency 07/25/2015  . Vitamin B 12 deficiency 07/25/2015  . Perennial allergic rhinitis 09/26/2014  . IBS (irritable bowel syndrome) 05/10/2014  . Other and unspecified hyperlipidemia 12/15/2012  . Overactive bladder 05/04/2012  . Asthma, moderate persistent 01/14/2012    [name] Schleifer C. Clif Serio PT, DPT 04/26/19 6:23 PM   [facility] [address] [place], [place], [address] Phone: [phone]   Fax:  [phone]  Name: Marissa Horn MRN: [id number] Date of Birth: 1971/06/27

## 2019-04-29 ENCOUNTER — Ambulatory Visit: Payer: 59 | Admitting: Physical Therapy

## 2019-04-29 ENCOUNTER — Other Ambulatory Visit: Payer: Self-pay

## 2019-04-29 DIAGNOSIS — M542 Cervicalgia: Secondary | ICD-10-CM

## 2019-04-29 DIAGNOSIS — M6281 Muscle weakness (generalized): Secondary | ICD-10-CM | POA: Diagnosis not present

## 2019-04-29 DIAGNOSIS — M25522 Pain in left elbow: Secondary | ICD-10-CM

## 2019-04-29 NOTE — Therapy (Signed)
[place], [place], [address] Phone: [phone]   Fax:  [phone]  Physical Therapy Treatment  Patient Details  Name: Marissa Horn MRN: [id number] Date of Birth: 11/05/1970 Referring Provider (PT): Joni Fears MD   Encounter Date: 04/29/2019  PT End of Session - 04/29/19 1025    Visit Number  6    Number of Visits  13    Date for PT Re-Evaluation  05/17/19    Authorization Type  UMR    PT Start Time  1020    PT Stop Time  1057    PT Time Calculation (min)  37 min    Activity Tolerance  Patient tolerated treatment well    Behavior During Therapy  [facility] for tasks assessed/performed       Past Medical History:  Diagnosis Date  . Adult ADHD   . Allergy   . Anemia   . Arthritis    hands  . Asthma    ?, inhaler use as needed  . Complication of anesthesia 1990   muscle relaxant caused lung to collapse  . Difficult intubation 1990  . Dyslipidemia   . History of chicken pox   . IBS (irritable bowel syndrome)    ?  [name] Kitchen Migraine   . UTI (lower urinary tract infection)     Past Surgical History:  Procedure Laterality Date  . ABDOMINAL HYSTERECTOMY  05/2012  . ABDOMINOPLASTY  05/04/2012   Procedure: ABDOMINOPLASTY;  Surgeon: Crissie Reese, MD;  Location: [facility];  Service: Plastics;  Laterality: N/A;  . ANAL FISSURE REPAIR     age 83  . EYE SURGERY Left    age 59  . GYNECOLOGIC CRYOSURGERY    . WISDOM TOOTH EXTRACTION      There were no vitals filed for this visit.  Subjective Assessment - 04/29/19 1025    Subjective  improved grip strength but still pain at forearm- more concentrated. The part we worked on last time is loose. I would say half of it is gone. Still have general neck tightness and nagging up to base of head.                       [facility] Adult PT Treatment/Exercise - 04/29/19 0001      Modalities   Modalities  Moist Heat      Moist Heat Therapy    Number Minutes Moist Heat  10 Minutes   concurrent with cervical STM   Moist Heat Location  Elbow      Manual Therapy   Manual Therapy  Other (comment)    Manual therapy comments  skilled palpation and monitoring during TPDN; use of pre-wrap for tennis elbow brace    Soft tissue mobilization  IASTM wrist extensors, suboccipital release    Manual Traction  cervical    Other Manual Therapy  ice massage lateral epicondyle       Trigger Point Dry Needling - 04/29/19 0001    Extensor carpi radialis longus/brevis Response  Twitch response elicited;Palpable increased muscle length    Extensor digitorum Response  Twitch response elicited;Palpable increased muscle length    Extensor carpi ulnaris Response  Twitch response elicited;Palpable increased muscle length                PT Long Term Goals - 04/22/19 1203      PT LONG TERM GOAL #1   Title  Pt will be independent with advanced HEP  Baseline  independent with initial HEP    Time  6    Period  Weeks    Target Date  05/17/19      PT LONG TERM GOAL #2   Title  FOTO will improve from 53%limtiaiton   to 37% limitation    indicating improved functional mobility.    Baseline  limitation 53% at eval    Time  6    Period  Weeks    Status  On-going    Target Date  05/17/19      PT LONG TERM GOAL #3   Title  Pt will be able to turn head to at least 45 degrees of rotation in order to drive car safely    Baseline  Pt cervical Rotation Right 52 and left 57 degrees    Time  6    Period  Weeks    Status  Achieved    Target Date  05/17/19      PT LONG TERM GOAL #4   Title  Pt will be able sleep and turn head without waking for 4 or more hours of restorative sleep    Baseline  Pt sleeping 50% better    Time  6    Period  Weeks    Status  On-going    Target Date  05/17/19      PT LONG TERM GOAL #5   Title  Pt will be able to grip dooknob without exacerbating pain in left hand and have symmetrical grip strenght within 7 lbs     Baseline  Grip strenth Left 26 lb and Right 46 lb eval    Time  6    Period  Weeks    Status  On-going    Target Date  05/17/19            Plan - 04/29/19 1059    Clinical Impression Statement  TPDN to wrist extensors followed by heat and then ice massage to insertion point. Reported feeling much better and placed temporary tennis elbow brace with pre wrap for trial. Did not require DN to cervical region, tightness was relieved with manual traction & release to suboccipitals. Lt upper trap noted to be tighter than Rt today.    PT Treatment/Interventions  ADLs/Self Care Home Management;Cryotherapy;Iontophoresis 43m/ml Dexamethasone;Moist Heat;Ultrasound;Neuromuscular re-education;Therapeutic exercise;Therapeutic activities;Patient/family education;Manual techniques;Dry needling;Passive range of motion;Joint Manipulations;Spinal Manipulations    PT Next Visit Plan  cont management of lat epicondylitis, add prone strengthening.    PT Home Exercise Plan  wrist flx/ext stretch, upper trap & levator stretch, passive wrist ext+eccentric lower, scalene stretch, isometric wrist pronation/ ext and radial deviation    Consulted and Agree with Plan of Care  Patient       Patient will benefit from skilled therapeutic intervention in order to improve the following deficits and impairments:  Pain, Impaired UE functional use, Decreased strength, Decreased range of motion, Postural dysfunction  Visit Diagnosis: Cervicalgia  Pain in left elbow  Muscle weakness (generalized)     Problem List Patient Active Problem List   Diagnosis Date Noted  . Neck pain 03/01/2019  . Adult ADHD 02/17/2018  . 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 Vitamin D deficiency 07/25/2015  . Vitamin B 12 deficiency 07/25/2015  . Perennial allergic rhinitis 09/26/2014  . IBS (irritable bowel syndrome) 05/10/2014  . Other and unspecified hyperlipidemia 12/15/2012  . Overactive bladder 05/04/2012  . Asthma,  moderate persistent 01/14/2012    Majesty Oehlert C. Almin Livingstone PT, DPT 04/29/19  11:02 AM   [place], [place], [address] Phone: [phone]   Fax:  [phone]  Name: Marissa Horn MRN: [id number] Date of Birth: 11/01/1970

## 2019-05-03 ENCOUNTER — Other Ambulatory Visit: Payer: Self-pay

## 2019-05-03 ENCOUNTER — Encounter: Payer: Self-pay | Admitting: Orthopaedic Surgery

## 2019-05-03 ENCOUNTER — Encounter: Payer: Self-pay | Admitting: Physical Therapy

## 2019-05-03 ENCOUNTER — Ambulatory Visit: Payer: 59 | Admitting: Physical Therapy

## 2019-05-03 DIAGNOSIS — M542 Cervicalgia: Secondary | ICD-10-CM | POA: Diagnosis not present

## 2019-05-03 DIAGNOSIS — M25522 Pain in left elbow: Secondary | ICD-10-CM | POA: Diagnosis not present

## 2019-05-03 DIAGNOSIS — M6281 Muscle weakness (generalized): Secondary | ICD-10-CM | POA: Diagnosis not present

## 2019-05-03 NOTE — Therapy (Signed)
[place], [place], [address] Phone: [phone]   Fax:  [phone]  Physical Therapy Treatment  Patient Details  Name: Marissa Horn MRN: [id number] Date of Birth: Oct 11, 1970 Referring Provider (PT): Joni Fears MD   Encounter Date: 05/03/2019  PT End of Session - 05/03/19 1457    Visit Number  7    Number of Visits  13    Date for PT Re-Evaluation  05/17/19    Authorization Type  UMR    PT Start Time  1148    PT Stop Time  1230    PT Time Calculation (min)  42 min    Activity Tolerance  Patient tolerated treatment well    Behavior During Therapy  [facility] for tasks assessed/performed       Past Medical History:  Diagnosis Date  . Adult ADHD   . Allergy   . Anemia   . Arthritis    hands  . Asthma    ?, inhaler use as needed  . Complication of anesthesia 1990   muscle relaxant caused lung to collapse  . Difficult intubation 1990  . Dyslipidemia   . History of chicken pox   . IBS (irritable bowel syndrome)    ?  [name] Kitchen Migraine   . UTI (lower urinary tract infection)     Past Surgical History:  Procedure Laterality Date  . ABDOMINAL HYSTERECTOMY  05/2012  . ABDOMINOPLASTY  05/04/2012   Procedure: ABDOMINOPLASTY;  Surgeon: Crissie Reese, MD;  Location: [facility];  Service: Plastics;  Laterality: N/A;  . ANAL FISSURE REPAIR     age 48  . EYE SURGERY Left    age 9  . GYNECOLOGIC CRYOSURGERY    . WISDOM TOOTH EXTRACTION      There were no vitals filed for this visit.  Subjective Assessment - 05/03/19 1152    Subjective  Decreased grip weakness but increased pain at lateral epicondyle.Mild tightness in cervical rotation.    Patient Stated Goals  Be able to move neck,    Currently in Pain?  Yes    Pain Score  7     Pain Location  Elbow    Pain Descriptors / Indicators  [facility] Adult PT Treatment/Exercise - 05/03/19 0001      Modalities    Modalities  Ultrasound      Ultrasound   Ultrasound Location  Lt lateral epicondyle    Ultrasound Parameters  pulsed, .5w/cm2 8 min    Ultrasound Goals  Pain      Manual Therapy   Manual therapy comments  skilled palpation and monitoring during TPDN    Soft tissue mobilization  IASTM wrist extensors; cervical paraspinals & upper trap on Lt       Trigger Point Dry Needling - 05/03/19 0001    Other Dry Needling  cervical paraspinals C2-5 bil    Upper Trapezius Response  Twitch reponse elicited;Palpable increased muscle length   Left          PT Education - 05/03/19 1457    Education Details  discussion of progression with elbow & POC    Person(s) Educated  Patient    Methods  Explanation    Comprehension  Verbalized understanding;Need further instruction          PT Long Term Goals - 04/22/19 1203  PT LONG TERM GOAL #1   Title  Pt will be independent with advanced HEP    Baseline  independent with initial HEP    Time  6    Period  Weeks    Target Date  05/17/19      PT LONG TERM GOAL #2   Title  FOTO will improve from 53%limtiaiton   to 37% limitation    indicating improved functional mobility.    Baseline  limitation 53% at eval    Time  6    Period  Weeks    Status  On-going    Target Date  05/17/19      PT LONG TERM GOAL #3   Title  Pt will be able to turn head to at least 45 degrees of rotation in order to drive car safely    Baseline  Pt cervical Rotation Right 52 and left 57 degrees    Time  6    Period  Weeks    Status  Achieved    Target Date  05/17/19      PT LONG TERM GOAL #4   Title  Pt will be able sleep and turn head without waking for 4 or more hours of restorative sleep    Baseline  Pt sleeping 50% better    Time  6    Period  Weeks    Status  On-going    Target Date  05/17/19      PT LONG TERM GOAL #5   Title  Pt will be able to grip dooknob without exacerbating pain in left hand and have symmetrical grip strenght within 7 lbs     Baseline  Grip strenth Left 26 lb and Right 46 lb eval    Time  6    Period  Weeks    Status  On-going    Target Date  05/17/19            Plan - 05/03/19 1458    Clinical Impression Statement  Pt felt that the wrap for her elbow was too painful and pain at epicondyle has actually increased. We trialed [place] today without any reduction in symptoms. At this time I believe she would benefit from imaging to see if there are any other issues with her elbow. Felt that her neck moved much better after DN today regardless of soreness.    PT Treatment/Interventions  ADLs/Self Care Home Management;Cryotherapy;Iontophoresis 37m/ml Dexamethasone;Moist Heat;Ultrasound;Neuromuscular re-education;Therapeutic exercise;Therapeutic activities;Patient/family education;Manual techniques;Dry needling;Passive range of motion;Joint Manipulations;Spinal Manipulations    PT Next Visit Plan  get apt for elbow? periscap strength    PT Home Exercise Plan  wrist flx/ext stretch, upper trap & levator stretch, passive wrist ext+eccentric lower, scalene stretch, isometric wrist pronation/ ext and radial deviation    Consulted and Agree with Plan of Care  Patient       Patient will benefit from skilled therapeutic intervention in order to improve the following deficits and impairments:  Pain, Impaired UE functional use, Decreased strength, Decreased range of motion, Postural dysfunction  Visit Diagnosis: Cervicalgia  Pain in left elbow  Muscle weakness (generalized)     Problem List Patient Active Problem List   Diagnosis Date Noted  . Neck pain 03/01/2019  . Adult ADHD 02/17/2018  . 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 Vitamin D deficiency 07/25/2015  . Vitamin B 12 deficiency 07/25/2015  . Perennial allergic rhinitis 09/26/2014  . IBS (irritable bowel syndrome) 05/10/2014  . Other and unspecified hyperlipidemia  12/15/2012  . Overactive bladder 05/04/2012  . Asthma, moderate persistent 01/14/2012     Lottie Sigman C. Hatice Bubel PT, DPT 05/03/19 3:02 PM   [facility] [address] [place], [place], [address] Phone: [phone]   Fax:  [phone]  Name: Meerab Maselli MRN: [id number] Date of Birth: [birth year]

## 2019-05-05 ENCOUNTER — Ambulatory Visit: Payer: 59 | Attending: Orthopaedic Surgery | Admitting: Physical Therapy

## 2019-05-05 ENCOUNTER — Ambulatory Visit (INDEPENDENT_AMBULATORY_CARE_PROVIDER_SITE_OTHER): Payer: 59 | Admitting: Orthopaedic Surgery

## 2019-05-05 ENCOUNTER — Other Ambulatory Visit: Payer: Self-pay

## 2019-05-05 ENCOUNTER — Encounter: Payer: Self-pay | Admitting: Orthopaedic Surgery

## 2019-05-05 ENCOUNTER — Encounter: Payer: Self-pay | Admitting: Physical Therapy

## 2019-05-05 DIAGNOSIS — M7712 Lateral epicondylitis, left elbow: Secondary | ICD-10-CM | POA: Diagnosis not present

## 2019-05-05 DIAGNOSIS — M25522 Pain in left elbow: Secondary | ICD-10-CM | POA: Insufficient documentation

## 2019-05-05 DIAGNOSIS — M542 Cervicalgia: Secondary | ICD-10-CM | POA: Diagnosis not present

## 2019-05-05 DIAGNOSIS — M6281 Muscle weakness (generalized): Secondary | ICD-10-CM | POA: Diagnosis not present

## 2019-05-05 MED ORDER — BUPIVACAINE HCL 0.5 % IJ SOLN
0.5000 mL | INTRAMUSCULAR | Status: AC | PRN
  Administered 2019-05-05: .5 mL

## 2019-05-05 MED ORDER — METHYLPREDNISOLONE ACETATE 40 MG/ML IJ SUSP
20.0000 mg | INTRAMUSCULAR | Status: AC | PRN
  Administered 2019-05-05: 14:00:00 20 mg

## 2019-05-05 MED ORDER — LIDOCAINE HCL (PF) 1 % IJ SOLN
1.0000 mL | INTRAMUSCULAR | Status: AC | PRN
  Administered 2019-05-05: 14:00:00 1 mL

## 2019-05-05 NOTE — Progress Notes (Signed)
Office Visit Note   Patient: Marissa Horn           Date of Birth: 04/01/1971           MRN: [id number] Visit Date: 05/05/2019              Requested by: Eulas Post, MD [place],  [place] [address] PCP: Eulas Post, MD   Assessment & Plan: Visit Diagnoses:  1. Left tennis elbow     Plan: Symptoms consistent with left tennis elbow.  Will inject with cortisone and monitor response.  Doing much better in terms of her cervical spine with physical therapy.  Therapy has not helped her tennis elbow  Follow-Up Instructions: Return if symptoms worsen or fail to improve.   Orders:  Orders Placed This Encounter  Procedures  . Hand/UE Inj: L elbow   No orders of the defined types were placed in this encounter.     Procedures: Hand/UE Inj: L elbow for lateral epicondylitis on 05/05/2019 1:49 PM Medications: 1 mL lidocaine (PF) 1 %; 0.5 mL bupivacaine 0.5 %; 20 mg methylPREDNISolone acetate 40 MG/ML      Clinical Data: No additional findings.   Subjective: Chief Complaint  Patient presents with  . Left Elbow - Pain  . Neck - Follow-up  Patient presents today for follow up. She said that her neck is improving with physical therapy. Her left elbow is continuing to hurt, and seems to be worse. She said that it improved with therapy, but then returns in the evening. She feels like her elbow is swollen.  HPI  Review of Systems   Objective: Vital Signs: BP 140/88   Pulse 71   Ht 5' 0.5" (1.537 m)   Wt 145 lb (65.8 kg)   LMP 04/14/2012   BMI 27.85 kg/m   Physical Exam Constitutional:      Appearance: She is well-developed.  Eyes:     Pupils: Pupils are equal, round, and reactive to light.  Pulmonary:     Effort: Pulmonary effort is normal.  Skin:    General: Skin is warm and dry.  Neurological:     Mental Status: She is alert and oriented to person, place, and time.  Psychiatric:        Behavior: Behavior normal.      Ortho Exam no pain with range of motion of cervical spine.  May be a little loss of motion but no referred discomfort.  Left elbow with local tenderness directly over the lateral epicondyles.  Pain with grip more in extension and in flexion.  Skin intact.  Neurovascular exam intact  Specialty Comments:  No specialty comments available.  Imaging: No results found.   PMFS History: Patient Active Problem List   Diagnosis Date Noted  . Left tennis elbow 05/05/2019  . Neck pain 03/01/2019  . Adult ADHD 02/17/2018  . 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 Vitamin D deficiency 07/25/2015  . Vitamin B 12 deficiency 07/25/2015  . Perennial allergic rhinitis 09/26/2014  . IBS (irritable bowel syndrome) 05/10/2014  . Other and unspecified hyperlipidemia 12/15/2012  . Overactive bladder 05/04/2012  . Asthma, moderate persistent 01/14/2012   Past Medical History:  Diagnosis Date  . Adult ADHD   . Allergy   . Anemia   . Arthritis    hands  . Asthma    ?, inhaler use as needed  . Complication of anesthesia 1990   muscle relaxant caused lung to collapse  .  Difficult intubation 1990  . Dyslipidemia   . History of chicken pox   . IBS (irritable bowel syndrome)    ?  [name] Kitchen Migraine   . UTI (lower urinary tract infection)     Family History  Problem Relation Age of Onset  . Arthritis Mother   . Hyperlipidemia Mother   . Glaucoma Mother   . Irritable bowel syndrome Mother   . Cataracts Mother   . Hyperlipidemia Father   . Heart disease Father 22       CAD  . Cataracts Father   . Thyroid disease Sister   . Hyperlipidemia Brother   . Ulcerative colitis Brother     Past Surgical History:  Procedure Laterality Date  . ABDOMINAL HYSTERECTOMY  05/2012  . ABDOMINOPLASTY  05/04/2012   Procedure: ABDOMINOPLASTY;  Surgeon: Crissie Reese, MD;  Location: [facility];  Service: Plastics;  Laterality: N/A;  . ANAL FISSURE REPAIR     age 65  . EYE SURGERY Left    age 24  . GYNECOLOGIC  CRYOSURGERY    . WISDOM TOOTH EXTRACTION     Social History   Occupational History  . Occupation: clinical dietitian  Tobacco Use  . Smoking status: Never Smoker  . Smokeless tobacco: Never Used  Substance and Sexual Activity  . Alcohol use: No  . Drug use: No  . Sexual activity: Yes    Partners: Male    Birth control/protection: Surgical    Comment: hysterectomy

## 2019-05-05 NOTE — Therapy (Signed)
[place], [place], [address] Phone: [phone]   Fax:  [phone]  Physical Therapy Treatment  Patient Details  Name: Marissa Horn MRN: [id number] Date of Birth: 1971-04-10 Referring Provider (PT): Joni Fears MD   Encounter Date: 05/05/2019  PT End of Session - 05/05/19 1254    Visit Number  8    Number of Visits  13    Date for PT Re-Evaluation  05/17/19    Authorization Type  UMR    PT Start Time  1148    PT Stop Time  1221    PT Time Calculation (min)  33 min    Activity Tolerance  Patient tolerated treatment well    Behavior During Therapy  [facility] for tasks assessed/performed       Past Medical History:  Diagnosis Date  . Adult ADHD   . Allergy   . Anemia   . Arthritis    hands  . Asthma    ?, inhaler use as needed  . Complication of anesthesia 1990   muscle relaxant caused lung to collapse  . Difficult intubation 1990  . Dyslipidemia   . History of chicken pox   . IBS (irritable bowel syndrome)    ?  [name] Kitchen Migraine   . UTI (lower urinary tract infection)     Past Surgical History:  Procedure Laterality Date  . ABDOMINAL HYSTERECTOMY  05/2012  . ABDOMINOPLASTY  05/04/2012   Procedure: ABDOMINOPLASTY;  Surgeon: Crissie Reese, MD;  Location: [facility];  Service: Plastics;  Laterality: N/A;  . ANAL FISSURE REPAIR     age 48  . EYE SURGERY Left    age 48  . GYNECOLOGIC CRYOSURGERY    . WISDOM TOOTH EXTRACTION      There were no vitals filed for this visit.  Subjective Assessment - 05/05/19 1152    Subjective  Feels a little better. Not sure if it was [place], IASTM or advil. Have an x-ray today.    Patient Stated Goals  Be able to move neck,    Currently in Pain?  Yes    Pain Score  4     Pain Location  Elbow    Pain Orientation  Left    Pain Descriptors / Indicators  Sore                       [facility] Adult PT Treatment/Exercise - 05/05/19 0001      Ultrasound   Ultrasound Location  Lt lateral epicondyle    Ultrasound Parameters  pulsed .5w/cm2 8 min    Ultrasound Goals  Pain      Manual Therapy   Soft tissue mobilization  IASTM wrist extensors, suboccipital release, scalene release Lt, joint mobs with cervical rotation                  PT Long Term Goals - 04/22/19 1203      PT LONG TERM GOAL #1   Title  Pt will be independent with advanced HEP    Baseline  independent with initial HEP    Time  6    Period  Weeks    Target Date  05/17/19      PT LONG TERM GOAL #2   Title  FOTO will improve from 53%limtiaiton   to 37% limitation    indicating improved functional mobility.    Baseline  limitation 53% at eval    Time  6  Period  Weeks    Status  On-going    Target Date  05/17/19      PT LONG TERM GOAL #3   Title  Pt will be able to turn head to at least 45 degrees of rotation in order to drive car safely    Baseline  Pt cervical Rotation Right 52 and left 57 degrees    Time  6    Period  Weeks    Status  Achieved    Target Date  05/17/19      PT LONG TERM GOAL #4   Title  Pt will be able sleep and turn head without waking for 4 or more hours of restorative sleep    Baseline  Pt sleeping 50% better    Time  6    Period  Weeks    Status  On-going    Target Date  05/17/19      PT LONG TERM GOAL #5   Title  Pt will be able to grip dooknob without exacerbating pain in left hand and have symmetrical grip strenght within 7 lbs    Baseline  Grip strenth Left 26 lb and Right 46 lb eval    Time  6    Period  Weeks    Status  On-going    Target Date  05/17/19            Plan - 05/05/19 1252    Clinical Impression Statement  US seems to have helped slightly but I did still encourage her to go for imaging as pain still has not improved as would be expected of lat epicondylitis.    PT Treatment/Interventions  ADLs/Self Care Home Management;Cryotherapy;Iontophoresis 74m/ml Dexamethasone;Moist  Heat;Ultrasound;Neuromuscular re-education;Therapeutic exercise;Therapeutic activities;Patient/family education;Manual techniques;Dry needling;Passive range of motion;Joint Manipulations;Spinal Manipulations    PT Next Visit Plan  continue PRN after MD f/u    PT Home Exercise Plan  wrist flx/ext stretch, upper trap & levator stretch, passive wrist ext+eccentric lower, scalene stretch, isometric wrist pronation/ ext and radial deviation    Consulted and Agree with Plan of Care  Patient       Patient will benefit from skilled therapeutic intervention in order to improve the following deficits and impairments:  Pain, Impaired UE functional use, Decreased strength, Decreased range of motion, Postural dysfunction  Visit Diagnosis: Cervicalgia  Pain in left elbow  Muscle weakness (generalized)     Problem List Patient Active Problem List   Diagnosis Date Noted  . Neck pain 03/01/2019  . Adult ADHD 02/17/2018  . 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 Vitamin D deficiency 07/25/2015  . Vitamin B 12 deficiency 07/25/2015  . Perennial allergic rhinitis 09/26/2014  . IBS (irritable bowel syndrome) 05/10/2014  . Other and unspecified hyperlipidemia 12/15/2012  . Overactive bladder 05/04/2012  . Asthma, moderate persistent 01/14/2012    Marissa Horn C. Ajayla Iglesias PT, DPT 05/05/19 1:05 PM   [facility] C[facility]1585 Livingston StreetGLowry City [place] [address]Phone: [phone]  Fax:  [phone] Name: Marissa AbramovichMRN: [id number] of Birth: 47August 24, 1972

## 2019-05-06 ENCOUNTER — Other Ambulatory Visit: Payer: Self-pay | Admitting: Family Medicine

## 2019-05-06 DIAGNOSIS — J4520 Mild intermittent asthma, uncomplicated: Secondary | ICD-10-CM

## 2019-05-06 MED FILL — MOMETASONE FUROATE 0.1% CRM: 0.1 | 30 days supply | Qty: 45 | Fill #2

## 2019-05-06 MED FILL — ESTRADIOL 0.5 MG TABS: 0.5 | 90 days supply | Qty: 90 | Fill #1

## 2019-05-06 MED FILL — MOMETASONE FUROATE 50 MCG S: 50 | 90 days supply | Qty: 51 | Fill #0

## 2019-05-06 MED FILL — VIT D2 1.25 MG (50,000 UNIT: 1.25 MG | 84 days supply | Qty: 12 | Fill #3

## 2019-05-06 MED FILL — CYANOCOBALAMIN 1,000 MCG/ML: 1000 | 84 days supply | Qty: 3 | Fill #2

## 2019-05-09 MED FILL — LEVALBUTEROL TAR HFA 45MCG: 45 | 25 days supply | Qty: 15 | Fill #0

## 2019-05-10 ENCOUNTER — Encounter: Payer: Self-pay | Admitting: Physical Therapy

## 2019-05-10 ENCOUNTER — Other Ambulatory Visit: Payer: Self-pay

## 2019-05-10 ENCOUNTER — Ambulatory Visit: Payer: 59 | Admitting: Physical Therapy

## 2019-05-10 DIAGNOSIS — M25522 Pain in left elbow: Secondary | ICD-10-CM

## 2019-05-10 DIAGNOSIS — M542 Cervicalgia: Secondary | ICD-10-CM | POA: Diagnosis not present

## 2019-05-10 DIAGNOSIS — M6281 Muscle weakness (generalized): Secondary | ICD-10-CM

## 2019-05-10 NOTE — Therapy (Signed)
[place], [place], [address] Phone: [phone]   Fax:  [phone]  Physical Therapy Treatment  Patient Details  Name: Marissa Horn MRN: [id number] Date of Birth: 08/09/1970 Referring Provider (PT): Marissa Fears MD   Encounter Date: 05/10/2019  PT End of Session - 05/10/19 1108    Visit Number  9    Number of Visits  13    Date for PT Re-Evaluation  05/17/19    Authorization Type  UMR    PT Start Time  1102    PT Stop Time  1146    PT Time Calculation (min)  44 min    Activity Tolerance  Patient tolerated treatment well    Behavior During Therapy  Marissa Horn for tasks assessed/performed       Past Medical History:  Diagnosis Date  . Adult ADHD   . Allergy   . Anemia   . Arthritis    hands  . Asthma    ?, inhaler use as needed  . Complication of anesthesia 1990   muscle relaxant caused lung to collapse  . Difficult intubation 1990  . Dyslipidemia   . History of chicken pox   . IBS (irritable bowel syndrome)    ?  Marissa Horn Kitchen Migraine   . UTI (lower urinary tract infection)     Past Surgical History:  Procedure Laterality Date  . ABDOMINAL HYSTERECTOMY  05/2012  . ABDOMINOPLASTY  05/04/2012   Procedure: ABDOMINOPLASTY;  Surgeon: Marissa Reese, MD;  Location: [facility];  Service: Plastics;  Laterality: N/A;  . ANAL FISSURE REPAIR     age 20  . EYE SURGERY Left    age 59  . GYNECOLOGIC CRYOSURGERY    . WISDOM TOOTH EXTRACTION      There were no vitals filed for this visit.  Subjective Assessment - 05/10/19 1105    Subjective  cortisone injection was very helpful for elbow. Lt shoulder muscle pulling a little today    Patient Stated Goals  Be able to move neck,    Currently in Pain?  Yes    Pain Score  2     Pain Location  Elbow    Pain Orientation  Left    Pain Descriptors / Indicators  Sore    Pain Location  Neck    Pain Orientation  Left    Pain Descriptors / Indicators  --    pulling                      [facility] Adult PT Treatment/Exercise - 05/10/19 0001      Ultrasound   Ultrasound Location  Lt lateral epicondyle     Ultrasound Parameters  pulsed .5w/cm2    Ultrasound Goals  Pain      Manual Therapy   Manual therapy comments  skilled palpation and monitoring during TPDN    Joint Mobilization  C6-T3 PA central & rib mobs    Soft tissue mobilization  bilat upper traps; IASTM Lt wrist extensors       Trigger Point Dry Needling - 05/10/19 0001    Upper Trapezius Response  Twitch reponse elicited;Palpable increased muscle length   bilatera               PT Long Term Goals - 04/22/19 1203      PT LONG TERM GOAL #1   Title  Pt will be independent with advanced HEP    Baseline  independent with initial  HEP    Time  6    Period  Weeks    Target Date  05/17/19      PT LONG TERM GOAL #2   Title  FOTO will improve from 53%limtiaiton   to 37% limitation    indicating improved functional mobility.    Baseline  limitation 53% at eval    Time  6    Period  Weeks    Status  On-going    Target Date  05/17/19      PT LONG TERM GOAL #3   Title  Pt will be able to turn head to at least 45 degrees of rotation in order to drive car safely    Baseline  Pt cervical Rotation Right 52 and left 57 degrees    Time  6    Period  Weeks    Status  Achieved    Target Date  05/17/19      PT LONG TERM GOAL #4   Title  Pt will be able sleep and turn head without waking for 4 or more hours of restorative sleep    Baseline  Pt sleeping 50% better    Time  6    Period  Weeks    Status  On-going    Target Date  05/17/19      PT LONG TERM GOAL #5   Title  Pt will be able to grip dooknob without exacerbating pain in left hand and have symmetrical grip strenght within 7 lbs    Baseline  Grip strenth Left 26 lb and Right 46 lb eval    Time  6    Period  Weeks    Status  On-going    Target Date  05/17/19            Plan - 05/10/19 1147     Clinical Impression Statement  Bilateral twitch in upper traps allowed mobility through upper thoracic region and reduced occipital HA. Continued with [place] as she felt it was the most helpful for her elbow.    PT Treatment/Interventions  ADLs/Self Care Home Management;Cryotherapy;Iontophoresis 52m/ml Dexamethasone;Moist Heat;Ultrasound;Neuromuscular re-education;Therapeutic exercise;Therapeutic activities;Patient/family education;Manual techniques;Dry needling;Passive range of motion;Joint Manipulations;Spinal Manipulations    PT Next Visit Plan  supine breathing for rib mobility    PT Home Exercise Plan  wrist flx/ext stretch, upper trap & levator stretch, passive wrist ext+eccentric lower, scalene stretch, isometric wrist pronation/ ext and radial deviation    Consulted and Agree with Plan of Care  Patient       Patient will benefit from skilled therapeutic intervention in order to improve the following deficits and impairments:  Pain, Impaired UE functional use, Decreased strength, Decreased range of motion, Postural dysfunction  Visit Diagnosis: Cervicalgia  Pain in left elbow  Muscle weakness (generalized)     Problem List Patient Active Problem List   Diagnosis Date Noted  . Left tennis elbow 05/05/2019  . Neck pain 03/01/2019  . Adult ADHD 02/17/2018  . 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 Vitamin D deficiency 07/25/2015  . Vitamin B 12 deficiency 07/25/2015  . Perennial allergic rhinitis 09/26/2014  . IBS (irritable bowel syndrome) 05/10/2014  . Other and unspecified hyperlipidemia 12/15/2012  . Overactive bladder 05/04/2012  . Asthma, moderate persistent 01/14/2012    Marissa Horn Marissa Horn PT, DPT 05/10/19 11:49 AM   [facility]194[address]GLutz N[place] [address]Phone: [phone]  Fax:  [phone] Name: Marissa KommerMRN:  [id number] Date of Birth: 08-16-1970

## 2019-05-12 ENCOUNTER — Ambulatory Visit: Payer: 59 | Admitting: Physical Therapy

## 2019-05-12 ENCOUNTER — Other Ambulatory Visit: Payer: Self-pay

## 2019-05-12 ENCOUNTER — Encounter: Payer: Self-pay | Admitting: Physical Therapy

## 2019-05-12 DIAGNOSIS — M25522 Pain in left elbow: Secondary | ICD-10-CM | POA: Diagnosis not present

## 2019-05-12 DIAGNOSIS — M542 Cervicalgia: Secondary | ICD-10-CM

## 2019-05-12 DIAGNOSIS — M6281 Muscle weakness (generalized): Secondary | ICD-10-CM | POA: Diagnosis not present

## 2019-05-12 NOTE — Therapy (Signed)
[place], [place], [address] Phone: [phone]   Fax:  [phone]  Physical Therapy Treatment  Patient Details  Name: Marissa Horn MRN: [id number] Date of Birth: 18-Oct-1970 Referring Provider (PT): Joni Fears MD   Encounter Date: 05/12/2019  PT End of Session - 05/12/19 1340    Visit Number  10    Number of Visits  13    Date for PT Re-Evaluation  05/17/19    Authorization Type  UMR    PT Start Time  1335    PT Stop Time  1407    PT Time Calculation (min)  32 min    Activity Tolerance  Patient tolerated treatment well    Behavior During Therapy  [facility] for tasks assessed/performed       Past Medical History:  Diagnosis Date  . Adult ADHD   . Allergy   . Anemia   . Arthritis    hands  . Asthma    ?, inhaler use as needed  . Complication of anesthesia 1990   muscle relaxant caused lung to collapse  . Difficult intubation 1990  . Dyslipidemia   . History of chicken pox   . IBS (irritable bowel syndrome)    ?  [name] Kitchen Migraine   . UTI (lower urinary tract infection)     Past Surgical History:  Procedure Laterality Date  . ABDOMINAL HYSTERECTOMY  05/2012  . ABDOMINOPLASTY  05/04/2012   Procedure: ABDOMINOPLASTY;  Surgeon: Crissie Reese, MD;  Location: [facility];  Service: Plastics;  Laterality: N/A;  . ANAL FISSURE REPAIR     age 48  . EYE SURGERY Left    age 48  . GYNECOLOGIC CRYOSURGERY    . WISDOM TOOTH EXTRACTION      There were no vitals filed for this visit.  Subjective Assessment - 05/12/19 1338    Subjective  Elbow has continued to improve. A little tight and sore in shoulders but not bad.                       [facility] Adult PT Treatment/Exercise - 05/12/19 0001      Ultrasound   Ultrasound Location  Lt lateral epicondyle     Ultrasound Parameters  pulsed .5 w/cm2    Ultrasound Goals  Pain      Manual Therapy   Soft tissue mobilization  bil upper  traps, cervical paraspinals, suboccipitals    Manual Traction  cervical             PT Education - 05/12/19 1411    Education Details  limit use of hand still, POC    Person(s) Educated  Patient    Methods  Explanation    Comprehension  Verbalized understanding          PT Long Term Goals - 04/22/19 1203      PT LONG TERM GOAL #1   Title  Pt will be independent with advanced HEP    Baseline  independent with initial HEP    Time  6    Period  Weeks    Target Date  05/17/19      PT LONG TERM GOAL #2   Title  FOTO will improve from 53%limtiaiton   to 37% limitation    indicating improved functional mobility.    Baseline  limitation 53% at eval    Time  6    Period  Weeks    Status  On-going  Target Date  05/17/19      PT LONG TERM GOAL #3   Title  Pt will be able to turn head to at least 45 degrees of rotation in order to drive car safely    Baseline  Pt cervical Rotation Right 52 and left 57 degrees    Time  6    Period  Weeks    Status  Achieved    Target Date  05/17/19      PT LONG TERM GOAL #4   Title  Pt will be able sleep and turn head without waking for 4 or more hours of restorative sleep    Baseline  Pt sleeping 50% better    Time  6    Period  Weeks    Status  On-going    Target Date  05/17/19      PT LONG TERM GOAL #5   Title  Pt will be able to grip dooknob without exacerbating pain in left hand and have symmetrical grip strenght within 7 lbs    Baseline  Grip strenth Left 26 lb and Right 46 lb eval    Time  6    Period  Weeks    Status  On-going    Target Date  05/17/19            Plan - 05/12/19 1408    Clinical Impression Statement  STM to cervical region rather than DN today. Pt feels that [place] has been helpful and elbow is still progressing. Was able to hold purse on forearm but still not able to grip. Will continue to limit use of grip.    PT Treatment/Interventions  ADLs/Self Care Home Management;Cryotherapy;Iontophoresis 25m/ml  Dexamethasone;Moist Heat;Ultrasound;Neuromuscular re-education;Therapeutic exercise;Therapeutic activities;Patient/family education;Manual techniques;Dry needling;Passive range of motion;Joint Manipulations;Spinal Manipulations    PT Next Visit Plan  end of POC    PT Home Exercise Plan  wrist flx/ext stretch, upper trap & levator stretch, passive wrist ext+eccentric lower, scalene stretch, isometric wrist pronation/ ext and radial deviation    Consulted and Agree with Plan of Care  Patient       Patient will benefit from skilled therapeutic intervention in order to improve the following deficits and impairments:  Pain, Impaired UE functional use, Decreased strength, Decreased range of motion, Postural dysfunction  Visit Diagnosis: Cervicalgia  Pain in left elbow  Muscle weakness (generalized)     Problem List Patient Active Problem List   Diagnosis Date Noted  . Left tennis elbow 05/05/2019  . Neck pain 03/01/2019  . Adult ADHD 02/17/2018  . 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 Vitamin D deficiency 07/25/2015  . Vitamin B 12 deficiency 07/25/2015  . Perennial allergic rhinitis 09/26/2014  . IBS (irritable bowel syndrome) 05/10/2014  . Other and unspecified hyperlipidemia 12/15/2012  . Overactive bladder 05/04/2012  . Asthma, moderate persistent 01/14/2012    Keavon Sensing C. Horris Speros PT, DPT 05/12/19 2:13 PM   [facility] C[facility] Hospital18[address] Campfire StreetGGreens Landing [place] [address]Phone: [phone]  Fax:  [phone] Name: AShardee DieuMRN: [id number] of Birth: [birth year]

## 2019-05-17 ENCOUNTER — Ambulatory Visit: Payer: 59 | Admitting: Physical Therapy

## 2019-05-20 ENCOUNTER — Ambulatory Visit: Payer: 59 | Admitting: Physical Therapy

## 2019-05-24 ENCOUNTER — Ambulatory Visit: Payer: 59 | Admitting: Physical Therapy

## 2019-05-26 ENCOUNTER — Encounter: Payer: Self-pay | Admitting: Physical Therapy

## 2019-05-26 ENCOUNTER — Other Ambulatory Visit: Payer: Self-pay

## 2019-05-26 ENCOUNTER — Ambulatory Visit: Payer: 59 | Admitting: Physical Therapy

## 2019-05-26 DIAGNOSIS — M6281 Muscle weakness (generalized): Secondary | ICD-10-CM | POA: Diagnosis not present

## 2019-05-26 DIAGNOSIS — M542 Cervicalgia: Secondary | ICD-10-CM

## 2019-05-26 DIAGNOSIS — M25522 Pain in left elbow: Secondary | ICD-10-CM

## 2019-05-26 NOTE — Therapy (Signed)
[place], [place], [address] Phone: [phone]   Fax:  [phone]  Physical Therapy Treatment/Discharge  Patient Details  Name: Marissa Horn MRN: [id number] Date of Birth: 30-Dec-1970 Referring Provider (PT): Joni Fears MD   Encounter Date: 05/26/2019  PT End of Session - 05/26/19 1122    Visit Number  11    Number of Visits  13    Date for PT Re-Evaluation  05/17/19    Authorization Type  UMR    PT Start Time  1105    PT Stop Time  1122    PT Time Calculation (min)  17 min    Activity Tolerance  Patient tolerated treatment well    Behavior During Therapy  [facility] for tasks assessed/performed       Past Medical History:  Diagnosis Date  . Adult ADHD   . Allergy   . Anemia   . Arthritis    hands  . Asthma    ?, inhaler use as needed  . Complication of anesthesia 1990   muscle relaxant caused lung to collapse  . Difficult intubation 1990  . Dyslipidemia   . History of chicken pox   . IBS (irritable bowel syndrome)    ?  [name] Kitchen Migraine   . UTI (lower urinary tract infection)     Past Surgical History:  Procedure Laterality Date  . ABDOMINAL HYSTERECTOMY  05/2012  . ABDOMINOPLASTY  05/04/2012   Procedure: ABDOMINOPLASTY;  Surgeon: Crissie Reese, MD;  Location: [facility];  Service: Plastics;  Laterality: N/A;  . ANAL FISSURE REPAIR     age 48  . EYE SURGERY Left    age 5  . GYNECOLOGIC CRYOSURGERY    . WISDOM TOOTH EXTRACTION      There were no vitals filed for this visit.  Subjective Assessment - 05/26/19 1108    Subjective  elbow hurts a little but not bad 1-2/10. Neck is slightly stiff but not debilitating 1-2/10. the exercises are helpful.         [facility] PT Assessment - 05/26/19 0001      Assessment   Medical Diagnosis  Cervicalgia and left lateral epicondylitis    Referring Provider (PT)  Joni Fears MD    Onset Date/Surgical Date  12/03/18      Observation/Other Assessments   Focus on Therapeutic Outcomes (FOTO)   38% limited      AROM   Cervical - Right Side Bend  20    Cervical - Left Side Bend  18    Cervical - Right Rotation  54    Cervical - Left Rotation  58      Strength   Overall Strength Comments  gross GHJ 5/5    Left Hand Grip (lbs)  27                           PT Education - 05/26/19 1136    Education Details  importance of continued HEP, progress & goals    Person(s) Educated  Patient    Methods  Explanation    Comprehension  Verbalized understanding          PT Long Term Goals - 05/26/19 1115      PT LONG TERM GOAL #1   Title  Pt will be independent with advanced HEP    Status  Achieved      PT LONG TERM GOAL #2   Title  FOTO will improve from 53%limtiaiton   to 37% limitation    indicating improved functional mobility.    Baseline  38% limited, improved from 53% limited    Status  Partially Met      PT LONG TERM GOAL #3   Title  Pt will be able to turn head to at least 45 degrees of rotation in order to drive car safely    Status  Achieved      PT LONG TERM GOAL #4   Title  Pt will be able sleep and turn head without waking for 4 or more hours of restorative sleep    Status  Achieved      PT LONG TERM GOAL #5   Title  Pt will be able to grip dooknob without exacerbating pain in left hand and have symmetrical grip strenght within 7 lbs    Baseline  Lt 27lb, Rt 50    Status  Not Met            Plan - 05/26/19 1128    Clinical Impression Statement  Pt has made significant progress in cervical pain and ROM as well as GHJ strength. Continues to have some stiffness and was encouraged to continue with HEP to maintain ROM gained. Remaining pain in elbow is low but still has decreased grip strength, as this is her dominant hand I did recommend that she discuss a second injection to further decrease the pain. Asked her to continue gentle use with the arm until pain has  decreased. Encouraged her to contact us with any further questions.       Patient will benefit from skilled therapeutic intervention in order to improve the following deficits and impairments:     Visit Diagnosis: Cervicalgia  Pain in left elbow  Muscle weakness (generalized)     Problem List Patient Active Problem List   Diagnosis Date Noted  . Left tennis elbow 05/05/2019  . Neck pain 03/01/2019  . Adult ADHD 02/17/2018  . 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 Vitamin D deficiency 07/25/2015  . Vitamin B 12 deficiency 07/25/2015  . Perennial allergic rhinitis 09/26/2014  . IBS (irritable bowel syndrome) 05/10/2014  . Other and unspecified hyperlipidemia 12/15/2012  . Overactive bladder 05/04/2012  . Asthma, moderate persistent 01/14/2012   PHYSICAL THERAPY DISCHARGE SUMMARY  Visits from Start of Care: 11  Current functional level related to goals / functional outcomes: See above   Remaining deficits: See above   Education / Equipment: Anatomy of condition, POC, HEP, exercise form/rationale  Plan: Patient agrees to discharge.  Patient goals were partially met. Patient is being discharged due to meeting the stated rehab goals.  ?????     Jovon Streetman C. Bradlee Bridgers PT, DPT 05/26/19 11:37 AM   [facility] [address] [place], [place], [address] Phone: [phone]   Fax:  [phone]  Name: Marissa Horn MRN: [id number] Date of Birth: 1971/05/31

## 2019-06-01 ENCOUNTER — Other Ambulatory Visit: Payer: Self-pay

## 2019-06-02 ENCOUNTER — Encounter: Payer: Self-pay | Admitting: Family Medicine

## 2019-06-03 ENCOUNTER — Other Ambulatory Visit: Payer: Self-pay

## 2019-06-03 ENCOUNTER — Encounter: Payer: Self-pay | Admitting: Obstetrics & Gynecology

## 2019-06-03 ENCOUNTER — Encounter

## 2019-06-03 ENCOUNTER — Telehealth: Payer: Self-pay | Admitting: Obstetrics & Gynecology

## 2019-06-03 ENCOUNTER — Other Ambulatory Visit: Payer: Self-pay | Admitting: Family Medicine

## 2019-06-03 ENCOUNTER — Ambulatory Visit: Payer: 59 | Admitting: Obstetrics & Gynecology

## 2019-06-03 VITALS — BP 102/74 | HR 76 | Temp 97.3°F | Resp 12 | Ht 60.25 in | Wt 152.0 lb

## 2019-06-03 DIAGNOSIS — Z01419 Encounter for gynecological examination (general) (routine) without abnormal findings: Secondary | ICD-10-CM | POA: Diagnosis not present

## 2019-06-03 MED ORDER — ESTRADIOL 1 MG PO TABS: 0.5000 mg | ORAL_TABLET | Freq: Every day | ORAL | 0 refills | Status: DC

## 2019-06-03 NOTE — Progress Notes (Signed)
48 y.o. G3P2 Married [place] female here for annual exam.  Denies vaginal bleeding.    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  Oldest is in early college.  He is a Paramedic.    Has some questions today.  Feels this really helps but is forgetting to take it.  When she misses, she does have some symptoms.  Not sure if needs increased dosage but would like to try.  Patient's last menstrual period was 04/14/2012.          Sexually active: Yes.    The current method of family planning is status post hysterectomy.    Exercising: No.  The patient does not participate in regular exercise at present. Smoker:  no  Health Maintenance: Pap:  01/08/12 Normal History of abnormal Pap:  Yes, remote hx MMG:  08/26/18 BIRADS 1 negative/density b Colonoscopy:  never BMD:   never TDaP:  2014 Screening Labs: PCP   reports that she has never smoked. She has never used smokeless tobacco. She reports that she does not drink alcohol or use drugs.  Past Medical History:  Diagnosis Date  . Adult ADHD   . Allergy   . Anemia   . Arthritis    hands  . Asthma    ?, inhaler use as needed  . Complication of anesthesia 1990   muscle relaxant caused lung to collapse  . Difficult intubation 1990  . Dyslipidemia   . History of chicken pox   . IBS (irritable bowel syndrome)    ?  [name] Kitchen Migraine   . UTI (lower urinary tract infection)     Past Surgical History:  Procedure Laterality Date  . ABDOMINAL HYSTERECTOMY  05/2012  . ABDOMINOPLASTY  05/04/2012   Procedure: ABDOMINOPLASTY;  Surgeon: Crissie Reese, MD;  Location: [facility];  Service: Plastics;  Laterality: N/A;  . ANAL FISSURE REPAIR     age 31  . EYE SURGERY Left    age 60  . GYNECOLOGIC CRYOSURGERY    . WISDOM TOOTH EXTRACTION      Current Outpatient Medications  Medication Sig Dispense Refill  . budesonide-formoterol (SYMBICORT) 80-4.5 MCG/ACT inhaler INHALE 2 PUFFS BY MOUTH INTO THE LUNGS 2 TIMES DAILY. 10.2 g 5  . cyanocobalamin (,VITAMIN B-12,) 1000 MCG/ML  injection Take B12 1,000 mcg IM once every 2 weeks for 2 months, then once monthly. 10 mL 2  . dexmethylphenidate (FOCALIN XR) 5 MG 24 hr capsule Take 1 capsule (5 mg total) by mouth daily. 30 capsule 0  . ergocalciferol (VITAMIN D2) 1.25 MG (50000 UT) capsule Take 1 capsule (50,000 Units total) by mouth once a week. 12 capsule 3  . estradiol (ESTRACE) 0.5 MG tablet TAKE 1 TABLET BY MOUTH ONCE DAILY 90 tablet 1  . ketotifen (ZADITOR) 0.025 % ophthalmic solution Place 1 drop into both eyes 2 (two) times daily.    [name] Kitchen levalbuterol (XOPENEX HFA) 45 MCG/ACT inhaler INHALE 1-2 PUFFS INTO THE LUNGS EVERY 6 (SIX) HOURS AS NEEDED FOR WHEEZING. 15 g 3  . levalbuterol (XOPENEX) 1.25 MG/3ML nebulizer solution USE 1 VIAL IN NEBULIZER EVERY 4 HOURS AS NEEDED FOR WHEEZING. 90 mL 0  . levocetirizine (XYZAL) 5 MG tablet TAKE 1 TABLET BY MOUTH ONCE DAILY AS NEEDED 90 tablet 3  . mometasone (ELOCON) 0.1 % cream Apply 1 application topically daily as needed. 45 g 11  . mometasone (NASONEX) 50 MCG/ACT nasal spray Place 2 sprays into the nose daily. 17 g 12  . NEEDLE, DISP, 25 G (BD ECLIPSE NEEDLE)  25G X 5/8" MISC Use as directed. 10 each 5  . ranitidine (ZANTAC) 150 MG capsule Take 150 mg by mouth 2 (two) times daily.     Penne Lash HFA 45 MCG/ACT inhaler INHALE 1 TO 2 PUFFS BY MOUTH INTO THE LUNGS EVERY 6 HOURS AS NEEDED FOR WHEEZING 15 g 2   No current facility-administered medications for this visit.     Family History  Problem Relation Age of Onset  . Arthritis Mother   . Hyperlipidemia Mother   . Glaucoma Mother   . Irritable bowel syndrome Mother   . Cataracts Mother   . Hyperlipidemia Father   . Heart disease Father 11       CAD  . Cataracts Father   . Thyroid disease Sister   . Hyperlipidemia Brother   . Ulcerative colitis Brother     Review of Systems  Constitutional: Negative.   HENT: Negative.   Eyes: Negative.   Respiratory: Negative.   Cardiovascular: Negative.   Gastrointestinal:  Negative.   Endocrine: Negative.   Genitourinary: Negative.   Musculoskeletal: Negative.   Skin: Negative.   Allergic/Immunologic: Negative.   Neurological: Negative.   Hematological: Negative.   Psychiatric/Behavioral: Negative.     Exam:   BP 102/74 (BP Location: Right Arm, Patient Position: Sitting, Cuff Size: Normal)   Pulse 76   Temp (!) 97.3 F (36.3 C) (Temporal)   Resp 12   Ht 5' 0.25" (1.53 m)   Wt 152 lb (68.9 kg)   LMP 04/14/2012   BMI 29.44 kg/m     Height: 5' 0.25" (153 cm)  Ht Readings from Last 3 Encounters:  06/03/19 5' 0.25" (1.53 m)  05/05/19 5' 0.5" (1.537 m)  03/23/19 5' 0.5" (1.537 m)    General appearance: alert, cooperative and appears stated age Head: Normocephalic, without obvious abnormality, atraumatic Neck: no adenopathy, supple, symmetrical, trachea midline and thyroid normal to inspection and palpation Lungs: clear to auscultation bilaterally Breasts: normal appearance, no masses or tenderness Heart: regular rate and rhythm Abdomen: soft, non-tender; bowel sounds normal; no masses,  no organomegaly Extremities: extremities normal, atraumatic, no cyanosis or edema Skin: Skin color, texture, turgor normal. No rashes or lesions Lymph nodes: Cervical, supraclavicular, and axillary nodes normal. No abnormal inguinal nodes palpated Neurologic: Grossly normal   Pelvic: External genitalia:  no lesions              Urethra:  normal appearing urethra with no masses, tenderness or lesions              Bartholins and Skenes: normal                 Vagina: normal appearing vagina with normal color and discharge, no lesions              Cervix: absent              Pap taken: No. Bimanual Exam:  Uterus:  uterus absent              Adnexa: no mass, fullness, tenderness               Rectovaginal: Confirms               Anus:  normal sphincter tone, no lesions  Chaperone was present for exam.  A:  Well Woman with normal exam H/o robotic assisted  TLH/bilateral salpingectomy 10/13 H/o elevated lipids Asthma H/O recurrent vulvar abscesses/folliculitis On HRT  P:   Mammogram guidelines reviewed pap  smear not indicated RF for 74m estradiol.  #90/0RF.  She will give update in 1-2 months due to increase in dosage. Colonoscopy recommended at age 48   Blood work and vaccines are UTD. return annually or prn

## 2019-06-03 NOTE — Telephone Encounter (Signed)
Patient was seen in office today. Patient called and left message with questions regarding new prescription dosage.

## 2019-06-03 NOTE — Telephone Encounter (Signed)
Spoke with pt. Had question about her new dosage of Estradiol. Pt states confused from old and new Rx.   Is pt to increase dosage and take 59m daily? On Rx  says to take 0.55mtablet daily.   Will route to Dr MiSabra Heck

## 2019-06-03 NOTE — Telephone Encounter (Signed)
I sent pt message via mychart.  Please follow up with her next week.  I thought I changed the dosage to 61m.  She can take two tablets daily.  She will need to let me know how it work so I can change dosage if she desires.

## 2019-06-06 ENCOUNTER — Encounter: Payer: Self-pay | Admitting: Family Medicine

## 2019-06-06 MED ORDER — ESTRADIOL 1 MG PO TABS: 1.0000 mg | ORAL_TABLET | Freq: Every day | ORAL | 4 refills | Status: DC

## 2019-06-06 MED FILL — ESTRADIOL 1 MG TABS: 1 | 90 days supply | Qty: 90 | Fill #0

## 2019-06-06 MED FILL — DEXMETHYLPHENIDATE HCL ER 5: 5 | 30 days supply | Qty: 30 | Fill #0

## 2019-06-06 NOTE — Telephone Encounter (Signed)
Spoke with pt. Pt agrees with plan. Pt states only having a few of 0.7m tablets and will need the new 153mtablet Rx. Pharmacy verified on file.  Will attach RX Routing to provider for final review. Patient is agreeable to disposition

## 2019-06-06 NOTE — Telephone Encounter (Signed)
Rx completed.  Encounter can be closed.

## 2019-06-06 NOTE — Telephone Encounter (Signed)
It looks like Dr. Elease Hashimoto sent this in today

## 2019-06-10 NOTE — Telephone Encounter (Signed)
Patient has left a few messages on my chart for medication refill hasnt received a response for the following medications   dexmethylphenidate (FOCALIN XR) 5 MG 24 hr capsule [[id number]]    To be sent to  [facility], [address]  [place] [address]  Phone: [phone] Fax: [phone]    Please advise

## 2019-06-13 MED ORDER — DEXMETHYLPHENIDATE HCL ER 5 MG PO CP24: ORAL_CAPSULE | ORAL | 0 refills | Status: DC

## 2019-06-13 MED ORDER — DEXMETHYLPHENIDATE HCL ER 5 MG PO CP24: 5.0000 mg | ORAL_CAPSULE | Freq: Every day | ORAL | 0 refills | Status: DC

## 2019-06-24 MED FILL — ESTRADIOL 1 MG TABS: 1 | 90 days supply | Qty: 90 | Fill #0

## 2019-06-24 MED FILL — DEXMETHYLPHENIDATE HCL ER 5: 5 | 30 days supply | Qty: 30 | Fill #0

## 2019-08-10 ENCOUNTER — Other Ambulatory Visit: Payer: Self-pay | Admitting: Family Medicine

## 2019-08-10 DIAGNOSIS — E559 Vitamin D deficiency, unspecified: Secondary | ICD-10-CM

## 2019-08-10 MED FILL — VIT D2 1.25 MG (50,000 UNIT: 1.25 MG | 28 days supply | Qty: 4 | Fill #0

## 2019-08-17 ENCOUNTER — Other Ambulatory Visit: Payer: Self-pay | Admitting: Family Medicine

## 2019-08-17 DIAGNOSIS — J4520 Mild intermittent asthma, uncomplicated: Secondary | ICD-10-CM

## 2019-08-17 MED FILL — MOMETASONE FUROATE 50 MCG S: 50 | 90 days supply | Qty: 51 | Fill #0

## 2019-08-17 MED FILL — CYANOCOBALAMIN 1,000 MCG/ML: 1000 | 84 days supply | Qty: 3 | Fill #3

## 2019-08-17 MED FILL — DEXMETHYLPHENIDATE HCL ER 5: 5 | 30 days supply | Qty: 30 | Fill #0

## 2019-09-12 DIAGNOSIS — H5213 Myopia, bilateral: Secondary | ICD-10-CM | POA: Diagnosis not present

## 2019-10-03 ENCOUNTER — Other Ambulatory Visit: Payer: Self-pay | Admitting: Family Medicine

## 2019-10-03 DIAGNOSIS — E559 Vitamin D deficiency, unspecified: Secondary | ICD-10-CM

## 2019-10-04 MED ORDER — DEXMETHYLPHENIDATE HCL ER 5 MG PO CP24: ORAL_CAPSULE | ORAL | 0 refills | Status: DC

## 2019-10-04 NOTE — Telephone Encounter (Signed)
Needs follow up.  Will refill once.

## 2019-10-05 MED FILL — DEXMETHYLPHENIDATE HCL ER 5: 5 | 30 days supply | Qty: 30 | Fill #0

## 2019-10-11 ENCOUNTER — Other Ambulatory Visit: Payer: Self-pay | Admitting: Obstetrics & Gynecology

## 2019-10-11 DIAGNOSIS — Z1231 Encounter for screening mammogram for malignant neoplasm of breast: Secondary | ICD-10-CM

## 2019-10-21 ENCOUNTER — Other Ambulatory Visit: Payer: Self-pay | Admitting: Family Medicine

## 2019-10-21 DIAGNOSIS — E559 Vitamin D deficiency, unspecified: Secondary | ICD-10-CM

## 2019-10-24 MED FILL — VIT D2 1.25 MG (50,000 UNIT: 1.25 MG | 28 days supply | Qty: 4 | Fill #0

## 2019-11-13 ENCOUNTER — Encounter: Payer: Self-pay | Admitting: Obstetrics & Gynecology

## 2019-11-15 ENCOUNTER — Other Ambulatory Visit: Payer: Self-pay

## 2019-11-15 ENCOUNTER — Ambulatory Visit
Admission: RE | Admit: 2019-11-15 | Discharge: 2019-11-15 | Disposition: A | Discharge status: Home / Self Care | Payer: 59 | Source: Ambulatory Visit | Attending: Obstetrics & Gynecology | Admitting: Obstetrics & Gynecology

## 2019-11-15 ENCOUNTER — Other Ambulatory Visit: Payer: Self-pay | Admitting: Obstetrics & Gynecology

## 2019-11-15 DIAGNOSIS — Z1231 Encounter for screening mammogram for malignant neoplasm of breast: Secondary | ICD-10-CM | POA: Diagnosis not present

## 2019-11-15 MED ORDER — ESTRADIOL 0.5 MG PO TABS: 0.5000 mg | ORAL_TABLET | Freq: Every day | ORAL | 1 refills | Status: DC

## 2019-11-15 MED FILL — ESTRADIOL 0.5 MG TABS: 0.5 | 90 days supply | Qty: 90 | Fill #0

## 2019-11-21 ENCOUNTER — Other Ambulatory Visit: Payer: Self-pay

## 2019-11-22 ENCOUNTER — Other Ambulatory Visit: Payer: Self-pay | Admitting: Family Medicine

## 2019-11-22 ENCOUNTER — Ambulatory Visit (INDEPENDENT_AMBULATORY_CARE_PROVIDER_SITE_OTHER): Payer: 59 | Admitting: Family Medicine

## 2019-11-22 VITALS — BP 120/82 | HR 85 | Temp 97.7°F | Ht 60.0 in | Wt 137.3 lb

## 2019-11-22 DIAGNOSIS — E538 Deficiency of other specified B group vitamins: Secondary | ICD-10-CM

## 2019-11-22 DIAGNOSIS — J4520 Mild intermittent asthma, uncomplicated: Secondary | ICD-10-CM | POA: Diagnosis not present

## 2019-11-22 DIAGNOSIS — Z0000 Encounter for general adult medical examination without abnormal findings: Secondary | ICD-10-CM | POA: Diagnosis not present

## 2019-11-22 DIAGNOSIS — E559 Vitamin D deficiency, unspecified: Secondary | ICD-10-CM | POA: Diagnosis not present

## 2019-11-22 DIAGNOSIS — Z789 Other specified health status: Secondary | ICD-10-CM | POA: Diagnosis not present

## 2019-11-22 LAB — CBC WITH DIFFERENTIAL/PLATELET
Basophils Absolute: 0.1 10*3/uL (ref 0.0–0.1)
Basophils Relative: 1.3 % (ref 0.0–3.0)
Eosinophils Absolute: 0 10*3/uL (ref 0.0–0.7)
Eosinophils Relative: 0.9 % (ref 0.0–5.0)
HCT: 36 % (ref 36.0–46.0)
Hemoglobin: 12.1 g/dL (ref 12.0–15.0)
Lymphocytes Relative: 33.6 % (ref 12.0–46.0)
Lymphs Abs: 1.8 10*3/uL (ref 0.7–4.0)
MCHC: 33.6 g/dL (ref 30.0–36.0)
MCV: 84.6 fl (ref 78.0–100.0)
Monocytes Absolute: 0.3 10*3/uL (ref 0.1–1.0)
Monocytes Relative: 5.9 % (ref 3.0–12.0)
Neutro Abs: 3 10*3/uL (ref 1.4–7.7)
Neutrophils Relative %: 58.3 % (ref 43.0–77.0)
Platelets: 319 10*3/uL (ref 150.0–400.0)
RBC: 4.26 Mil/uL (ref 3.87–5.11)
RDW: 14.1 % (ref 11.5–15.5)
WBC: 5.2 10*3/uL (ref 4.0–10.5)

## 2019-11-22 LAB — BASIC METABOLIC PANEL
BUN: 10 mg/dL (ref 6–23)
CO2: 26 mEq/L (ref 19–32)
Calcium: 9.6 mg/dL (ref 8.4–10.5)
Chloride: 100 mEq/L (ref 96–112)
Creatinine, Ser: 0.8 mg/dL (ref 0.40–1.20)
GFR: 76.6 mL/min (ref >60.00)
Glucose, Bld: 103 mg/dL — ABNORMAL HIGH (ref 70–99)
Potassium: 4.3 mEq/L (ref 3.5–5.1)
Sodium: 136 mEq/L (ref 135–145)

## 2019-11-22 LAB — LIPID PANEL
Cholesterol: 261 mg/dL — ABNORMAL HIGH (ref 0–200)
HDL: 46.4 mg/dL (ref >39.00)
LDL Cholesterol: 192 mg/dL — ABNORMAL HIGH (ref 0–99)
NonHDL: 214.27
Total CHOL/HDL Ratio: 6
Triglycerides: 109 mg/dL (ref 0.0–149.0)
VLDL: 21.8 mg/dL (ref 0.0–40.0)

## 2019-11-22 LAB — HEPATIC FUNCTION PANEL
ALT: 21 U/L (ref 0–35)
AST: 18 U/L (ref 0–37)
Albumin: 4.6 g/dL (ref 3.5–5.2)
Alkaline Phosphatase: 61 U/L (ref 39–117)
Bilirubin, Direct: 0.1 mg/dL (ref 0.0–0.3)
Total Bilirubin: 1.2 mg/dL (ref 0.2–1.2)
Total Protein: 7 g/dL (ref 6.0–8.3)

## 2019-11-22 LAB — VITAMIN B12: Vitamin B-12: 602 pg/mL (ref 211–911)

## 2019-11-22 LAB — HEMOGLOBIN A1C: Hgb A1c MFr Bld: 6.2 % (ref 4.6–6.5)

## 2019-11-22 LAB — TSH: TSH: 2.28 u[IU]/mL (ref 0.35–4.50)

## 2019-11-22 LAB — VITAMIN D 25 HYDROXY (VIT D DEFICIENCY, FRACTURES): VITD: 60.63 ng/mL (ref 30.00–100.00)

## 2019-11-22 MED ORDER — BUDESONIDE-FORMOTEROL FUMARATE 80-4.5 MCG/ACT IN AERO: INHALATION_SPRAY | RESPIRATORY_TRACT | 5 refills | Status: DC

## 2019-11-22 MED ORDER — AMPHETAMINE-DEXTROAMPHETAMINE 10 MG PO TABS: 10.0000 mg | ORAL_TABLET | Freq: Two times a day (BID) | ORAL | 0 refills | Status: DC

## 2019-11-22 MED ORDER — CYANOCOBALAMIN 1000 MCG/ML IJ SOLN: INTRAMUSCULAR | 2 refills | Status: DC

## 2019-11-22 MED ORDER — VITAMIN D (ERGOCALCIFEROL) 1.25 MG (50000 UNIT) PO CAPS: 50000.0000 [IU] | ORAL_CAPSULE | ORAL | 3 refills | Status: DC

## 2019-11-22 MED ORDER — LEVALBUTEROL TARTRATE 45 MCG/ACT IN AERO: 1.0000 | INHALATION_SPRAY | Freq: Four times a day (QID) | RESPIRATORY_TRACT | 3 refills | Status: DC | PRN

## 2019-11-22 MED ORDER — LEVALBUTEROL HCL 1.25 MG/3ML IN NEBU: INHALATION_SOLUTION | RESPIRATORY_TRACT | 0 refills | Status: DC

## 2019-11-22 MED FILL — VIT D2 1.25 MG (50,000 UNIT: 1.25 MG | 84 days supply | Qty: 12 | Fill #0

## 2019-11-22 MED FILL — SYMBICORT 80-4.5 MCG INH: 80-4.5 | 30 days supply | Qty: 10 | Fill #0

## 2019-11-22 MED FILL — LEVALBUTEROL HCL 1.25 MG/3M: 1.25 | 4 days supply | Qty: 75 | Fill #0

## 2019-11-22 MED FILL — LEVALBUTEROL TAR HFA 45MCG: 45 | 25 days supply | Qty: 15 | Fill #0

## 2019-11-22 MED FILL — AMPHETAMINE-DEXTROAMPHETAMI: 10 | 30 days supply | Qty: 60 | Fill #0

## 2019-11-22 MED FILL — CYANOCOBALAMIN 1,000 MCG/ML: 1000 | 84 days supply | Qty: 5 | Fill #0

## 2019-11-22 NOTE — Patient Instructions (Signed)

## 2019-11-22 NOTE — Progress Notes (Addendum)
Subjective:     Patient ID: Marissa Horn, female   DOB: April 07, 1971, 49 y.o.   MRN: [id number]  HPI Marissa Horn is seen for physical exam.  She has asthma which has been very well controlled.  She has rarely had to use her Xopenex.  She has done tremendous job with weight loss during the past year.  At 1 point during the pandemic she got up to 150 pounds and currently down 134 pounds by her scales.  She is a vegetarian.  She request vitamin D, B12, and zinc levels.  She has ADD and would like to consider going back on Adderall which she was placed on initially and thought worked better than Focalin.  She has had Covid vaccine.  Her tetanus is up-to-date.  She sees GYN for mammograms.  She has had previous hysterectomy and does not get Pap smears  Past Medical History:  Diagnosis Date  . Adult ADHD   . Allergy   . Anemia   . Arthritis    hands  . Asthma    ?, inhaler use as needed  . Complication of anesthesia 1990   muscle relaxant caused lung to collapse  . Difficult intubation 1990  . Dyslipidemia   . History of chicken pox   . IBS (irritable bowel syndrome)    ?  [name] Kitchen Migraine   . UTI (lower urinary tract infection)    Past Surgical History:  Procedure Laterality Date  . ABDOMINAL HYSTERECTOMY  05/2012  . ABDOMINOPLASTY  05/04/2012   Procedure: ABDOMINOPLASTY;  Surgeon: Crissie Reese, MD;  Location: [facility];  Service: Plastics;  Laterality: N/A;  . ANAL FISSURE REPAIR     age 78  . EYE SURGERY Left    age 77  . GYNECOLOGIC CRYOSURGERY    . WISDOM TOOTH EXTRACTION      reports that she has never smoked. She has never used smokeless tobacco. She reports that she does not drink alcohol or use drugs. family history includes Arthritis in her mother; Cataracts in her father and mother; Glaucoma in her mother; Heart disease (age of onset: 1) in her father; Hyperlipidemia in her brother, father, and mother; Irritable bowel syndrome in her mother; Thyroid disease in her sister;  Ulcerative colitis in her brother. Allergies  Allergen Reactions  . Scopolamine Anaphylaxis  . Vyvanse [Lisdexamfetamine Dimesylate]     Severe migraine  . Bactrim [Sulfamethoxazole-Trimethoprim]     Joint pain, fever  . Biaxin [Clarithromycin] Nausea And Vomiting    Projectile vomiting  . Tamiflu [Oseltamivir Phosphate] Nausea And Vomiting    Projectile vomiting  . Cephalexin Nausea And Vomiting  . Sulfa Antibiotics     Unknown reaction  . Asa [Aspirin] Rash    Pt takes ibuprofen without problems   Wt Readings from Last 3 Encounters:  11/22/19 137 lb 4.8 oz (62.3 kg)  06/03/19 152 lb (68.9 kg)  05/05/19 145 lb (65.8 kg)   The 10-year ASCVD risk score Mikey Bussing DC Jr., et al., 2013) is: 1.6%   Values used to calculate the score:     Age: 72 years     Sex: Female     Is Non-Hispanic African American: No     Diabetic: No     Tobacco smoker: No     Systolic Blood Pressure: 709 mmHg     Is BP treated: No     HDL Cholesterol: 46.4 mg/dL     Total Cholesterol: 261 mg/dL     Review of Systems  Constitutional: Negative for activity change, appetite change, fatigue, fever and unexpected weight change.  HENT: Negative for ear pain, hearing loss, sore throat and trouble swallowing.   Eyes: Negative for visual disturbance.  Respiratory: Negative for cough and shortness of breath.   Cardiovascular: Negative for chest pain and palpitations.  Gastrointestinal: Negative for abdominal pain, blood in stool, constipation and diarrhea.  Genitourinary: Negative for dysuria and hematuria.  Musculoskeletal: Negative for arthralgias, back pain and myalgias.  Skin: Negative for rash.  Neurological: Negative for dizziness, syncope and headaches.  Hematological: Negative for adenopathy.  Psychiatric/Behavioral: Negative for confusion and dysphoric mood.       Objective:   Physical Exam Constitutional:      Appearance: She is well-developed.  HENT:     Head: Normocephalic and atraumatic.   Eyes:     Pupils: Pupils are equal, round, and reactive to light.  Neck:     Thyroid: No thyromegaly.  Cardiovascular:     Rate and Rhythm: Normal rate and regular rhythm.     Heart sounds: Normal heart sounds. No murmur.  Pulmonary:     Effort: No respiratory distress.     Breath sounds: Normal breath sounds. No wheezing or rales.  Abdominal:     General: Bowel sounds are normal. There is no distension.     Palpations: Abdomen is soft. There is no mass.     Tenderness: There is no abdominal tenderness. There is no guarding or rebound.  Musculoskeletal:        General: Normal range of motion.     Cervical back: Normal range of motion and neck supple.  Lymphadenopathy:     Cervical: No cervical adenopathy.  Skin:    Findings: No rash.  Neurological:     Mental Status: She is alert and oriented to person, place, and time.     Cranial Nerves: No cranial nerve deficit.     Deep Tendon Reflexes: Reflexes normal.  Psychiatric:        Behavior: Behavior normal.        Thought Content: Thought content normal.        Judgment: Judgment normal.        Assessment:     Physical exam.  Generally healthy 49 year old female.  She has done tremendous job with weight loss during the past year and is exercising regularly with walking.  We discussed the following    Plan:     -Obtain annual screening labs.  Will include 25 hydroxy vitamin D level, B12, and zinc because of her vegetarian status.  Also check A1c with her prior history of elevated blood sugars.  Suspect these will be improved with her weight loss  -Continue regular weightbearing exercise  -Patient requesting transition back to low-dose Adderall 10 mg twice daily with prescription written and she will give [place] feedback in a month  -Refilled regular medications for 1 year  Eulas Post MD [facility]

## 2019-11-24 LAB — ZINC: Zinc: 69 ug/dL (ref 60–130)

## 2019-11-25 ENCOUNTER — Encounter: Payer: Self-pay | Admitting: Family Medicine

## 2019-11-25 ENCOUNTER — Encounter: Payer: Self-pay | Admitting: Physical Therapy

## 2019-11-28 ENCOUNTER — Telehealth: Payer: Self-pay | Admitting: Family Medicine

## 2019-11-28 DIAGNOSIS — M25519 Pain in unspecified shoulder: Secondary | ICD-10-CM

## 2019-11-28 NOTE — Telephone Encounter (Signed)
Please advise 

## 2019-11-28 NOTE — Telephone Encounter (Signed)
Pt would like a referral to PT. Patient is scheduled already at [facility] on [address] on 4/27 and need referral before they can see her. Its for pulled muscle in shoulder and back.

## 2019-11-28 NOTE — Telephone Encounter (Signed)
Ok to put in referral order.

## 2019-11-28 NOTE — Telephone Encounter (Signed)
Referral has been placed. 

## 2019-11-29 ENCOUNTER — Ambulatory Visit: Payer: 59 | Attending: Family Medicine | Admitting: Physical Therapy

## 2019-11-29 ENCOUNTER — Other Ambulatory Visit: Payer: Self-pay

## 2019-11-29 ENCOUNTER — Encounter: Payer: Self-pay | Admitting: Physical Therapy

## 2019-11-29 DIAGNOSIS — M25511 Pain in right shoulder: Secondary | ICD-10-CM | POA: Insufficient documentation

## 2019-11-29 DIAGNOSIS — M542 Cervicalgia: Secondary | ICD-10-CM | POA: Insufficient documentation

## 2019-11-29 NOTE — Therapy (Signed)
[place], [place], [address] Phone: [phone]   Fax:  [phone]  Physical Therapy Evaluation  Patient Details  Name: Marissa Horn MRN: [id number] Date of Birth: Jul 24, 1971 Referring Provider (PT): Eulas Post, MD   Encounter Date: 11/29/2019  PT End of Session - 11/29/19 1435    Visit Number  1    Number of Visits  13    Date for PT Re-Evaluation  01/10/20    Authorization Type  [facility]    PT Start Time  1244   pt arrived late   PT Stop Time  1315    PT Time Calculation (min)  31 min    Activity Tolerance  Patient tolerated treatment well    Behavior During Therapy  [facility] for tasks assessed/performed       Past Medical History:  Diagnosis Date  . Adult ADHD   . Allergy   . Anemia   . Arthritis    hands  . Asthma    ?, inhaler use as needed  . Complication of anesthesia 1990   muscle relaxant caused lung to collapse  . Difficult intubation 1990  . Dyslipidemia   . History of chicken pox   . IBS (irritable bowel syndrome)    ?  [name] Kitchen Migraine   . UTI (lower urinary tract infection)     Past Surgical History:  Procedure Laterality Date  . ABDOMINAL HYSTERECTOMY  05/2012  . ABDOMINOPLASTY  05/04/2012   Procedure: ABDOMINOPLASTY;  Surgeon: Crissie Reese, MD;  Location: [facility];  Service: Plastics;  Laterality: N/A;  . ANAL FISSURE REPAIR     age 57  . EYE SURGERY Left    age 78  . GYNECOLOGIC CRYOSURGERY    . WISDOM TOOTH EXTRACTION      There were no vitals filed for this visit.   Subjective Assessment - 11/29/19 1244    Subjective  About a week ago, maybe slept wrong. radiating down Right arm.    Patient Stated Goals  decrease pain, look down at phone    Currently in Pain?  Yes    Pain Score  7     Pain Location  Shoulder    Pain Orientation  Right    Pain Descriptors / Indicators  Sore    Aggravating Factors   looking down    Pain Relieving Factors  massage did not  help         [facility] PT Assessment - 11/29/19 0001      Assessment   Medical Diagnosis  shoulder pain    Referring Provider (PT)  Burchette, Alinda Sierras, MD    Onset Date/Surgical Date  --   about a week ago   Hand Dominance  Right;Left    Prior Therapy  yes      Precautions   Precautions  None      Restrictions   Weight Bearing Restrictions  No      Balance Screen   Has the patient fallen in the past 6 months  No      [place] residence    Living Arrangements  Spouse/significant other;Children      Prior Function   Level of Independence  Independent      Cognition   Overall Cognitive Status  Within Functional Limits for tasks assessed      Observation/Other Assessments   Focus on Therapeutic Outcomes (FOTO)   --   incorrect set  up     Sensation   Additional Comments  [facility]      Posture/Postural Control   Posture Comments  Rt GHJ elevation, rounded shoulders, forward head      ROM / Strength   AROM / PROM / Strength  AROM      AROM   AROM Assessment Site  Cervical    Cervical Flexion  40   pain   Cervical - Right Side Bend  34    Cervical - Left Side Bend  34    Cervical - Right Rotation  40    Cervical - Left Rotation  60      Palpation   Palpation comment  TTP and tightness through bil upper traps, Rt levator scap, notable decreased Rt rib cage mobility with deep breathing- esp at level of T5                Objective measurements completed on examination: See above findings.      [facility] Treatment/Exercise - 11/29/19 0001      Manual Therapy   Manual Therapy  Soft tissue mobilization;Joint mobilization    Manual therapy comments  skilled palpation and monitoring during TPDN    Joint Mobilization  thoracic & ribs- gr 4 with cavitations noted    Soft tissue mobilization  Rt upper trap, levator, rhomboids, middle traps             PT Education - 11/29/19 1434    Education Details  anatomy of  condition, POC, HEP, returning to exercises    Person(s) Educated  Patient    Methods  Explanation    Comprehension  Verbalized understanding;Need further instruction          PT Long Term Goals - 11/29/19 1418      PT LONG TERM GOAL #1   Title  will be able to take deep breaths without pain    Baseline  pain around levle of T5    Time  6    Period  Weeks    Status  New    Target Date  01/13/20      PT LONG TERM GOAL #2   Title  cervical rotation equal bilaterally    Baseline  see flowsheet    Time  6    Period  Weeks    Status  New    Target Date  01/13/20      PT LONG TERM GOAL #3   Title  resolution of referred symptoms to Rt UE    Baseline  frequent since onset of pain    Time  6    Period  Weeks    Status  New    Target Date  01/13/20      PT LONG TERM GOAL #4   Title  pt will demonstrate proper resting posture    Baseline  forward head, Rt GHJ elevation    Time  6    Period  Weeks    Status  New    Target Date  01/13/20             Plan - 11/29/19 1403    Clinical Impression Statement  pt presents to PT with complaints of Rt upper trap & levator pain of insidious onset that began about a week ago. Pain with inhalation with noted rib rotations. DN utilized at eval as pt is familiar to me and she has been dry needled in the past with good tolerance. Significant twitches  found in upper trap, levator and rhomboids on Rt side and cavitations in thoracic spine with mobilizations following. Continued to have some discomfort in deep inhalation at the end of the session but was able to demonstrate decreased shoulder elevation and had soreness from the TPDN. Pt will continue to benefit from skilled PT in order to decrease pain and reach long term goals.    Examination-Activity Limitations  Stand;Lift;Bend;Carry;Sit;Sleep    Examination-Participation Restrictions  Other    Stability/Clinical Decision Making  Stable/Uncomplicated    Clinical Decision Making  Low     Rehab Potential  Good    PT Frequency  2x / week    PT Duration  6 weeks    PT Treatment/Interventions  ADLs/Self Care Home Management;Cryotherapy;Electrical Stimulation;Ultrasound;Traction;Moist Heat;Iontophoresis 42m/ml Dexamethasone;Functional mobility training;Therapeutic activities;Therapeutic exercise;Patient/family education;Neuromuscular re-education;Manual techniques;Taping;Dry needling;Passive range of motion;Spinal Manipulations;Joint Manipulations    PT Next Visit Plan  continue DN PRN, extension mobility    PT Home Exercise Plan  return to HEP from last round of PT    Consulted and Agree with Plan of Care  Patient       Patient will benefit from skilled therapeutic intervention in order to improve the following deficits and impairments:  Improper body mechanics, Pain, Postural dysfunction, Increased muscle spasms, Decreased activity tolerance, Decreased range of motion, Impaired UE functional use  Visit Diagnosis: Acute pain of right shoulder - Plan: PT plan of care cert/re-cert  Cervicalgia - Plan: PT plan of care cert/re-cert     Problem List Patient Active Problem List   Diagnosis Date Noted  . Left tennis elbow 05/05/2019  . Neck pain 03/01/2019  . Adult ADHD 02/17/2018  . 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 Vitamin D deficiency 07/25/2015  . Vitamin B 12 deficiency 07/25/2015  . Perennial allergic rhinitis 09/26/2014  . IBS (irritable bowel syndrome) 05/10/2014  . Other and unspecified hyperlipidemia 12/15/2012  . Overactive bladder 05/04/2012  . Asthma, moderate persistent 01/14/2012    Dylanie Quesenberry C. [name] PT, DPT 11/29/19 2:37 PM   [facility]GCenter Moric[place] NAl[place] [address]Phone: [phone]  Fax:  [phone] Name: Marissa KindigMRN: [id number] of Birth: [birth year]

## 2019-11-30 ENCOUNTER — Ambulatory Visit: Payer: 59 | Admitting: Physical Therapy

## 2019-11-30 ENCOUNTER — Encounter: Payer: Self-pay | Admitting: Physical Therapy

## 2019-11-30 DIAGNOSIS — M25511 Pain in right shoulder: Secondary | ICD-10-CM | POA: Diagnosis not present

## 2019-11-30 DIAGNOSIS — M542 Cervicalgia: Secondary | ICD-10-CM | POA: Diagnosis not present

## 2019-11-30 NOTE — Therapy (Signed)
[place], [place], [address] Phone: [phone]   Fax:  [phone]  Physical Therapy Treatment  Patient Details  Name: Marissa Horn MRN: [id number] Date of Birth: [birth year] Referring Provider (PT): Eulas Post, MD   Encounter Date: 11/30/2019  PT End of Session - 11/30/19 1330    Visit Number  2    Number of Visits  13    Date for PT Re-Evaluation  01/10/20    Authorization Type  UMR    PT Start Time  1335    PT Stop Time  1418    PT Time Calculation (min)  43 min    Activity Tolerance  Patient tolerated treatment well    Behavior During Therapy  [facility] for tasks assessed/performed       Past Medical History:  Diagnosis Date  . Adult ADHD   . Allergy   . Anemia   . Arthritis    hands  . Asthma    ?, inhaler use as needed  . Complication of anesthesia 1990   muscle relaxant caused lung to collapse  . Difficult intubation 1990  . Dyslipidemia   . History of chicken pox   . IBS (irritable bowel syndrome)    ?  [name] Kitchen Migraine   . UTI (lower urinary tract infection)     Past Surgical History:  Procedure Laterality Date  . ABDOMINAL HYSTERECTOMY  05/2012  . ABDOMINOPLASTY  05/04/2012   Procedure: ABDOMINOPLASTY;  Surgeon: Crissie Reese, MD;  Location: [facility];  Service: Plastics;  Laterality: N/A;  . ANAL FISSURE REPAIR     age 89  . EYE SURGERY Left    age 28  . GYNECOLOGIC CRYOSURGERY    . WISDOM TOOTH EXTRACTION      There were no vitals filed for this visit.  Subjective Assessment - 11/30/19 1335    Subjective  Continuous pain in same area. progressively worse over the day    Patient Stated Goals  decrease pain, look down at phone    Currently in Pain?  Yes    Pain Score  6     Pain Location  Neck         [facility] PT Assessment - 11/30/19 0001      AROM   AROM Assessment Site  Cervical    Cervical Flexion  46    Cervical - Right Side Bend  36   44 after TPDN   Cervical - Left Side Bend  40   47 after TPDN   Cervical - Right Rotation  44   52 after TPDN   Cervical - Left Rotation  68   72 after TPDN                  [facility] Adult PT Treatment/Exercise - 11/30/19 0001      Exercises   Exercises  Shoulder;Other Exercises;Neck    Other Exercises   Childs Pose       Neck Exercises: Supine   Cervical Rotation  Both    Cervical Rotation Limitations  AAROM w/ bed sheet      Shoulder Exercises: Sidelying   Other Sidelying Exercises  Sub scap/ infraspinatus stretch w/ towel    60 sec     Shoulder Exercises: Stretch   Cross Chest Stretch  2 reps;30 seconds      Manual Therapy   Manual Therapy  Soft tissue mobilization;Joint mobilization    Manual therapy comments  skilled palpation and  monitoring during TPDN    Joint Mobilization  prone scapular mobility    Soft tissue mobilization  Rt upper trap, levator, rhomboids, middle traps      Neck Exercises: Stretches   Upper Trapezius Stretch  Right;Left;1 rep;30 seconds   w/ sheet      Trigger Point Dry Needling - 11/30/19 0001    Consent Given?  Yes    Education Handout Provided  Previously provided    Muscles Treated Head and Neck  Upper trapezius;Levator scapulae    Muscles Treated Upper Quadrant  Longissimus;Lower trapezius;Middle trapezius;Infraspinatus;Rhomboids    Upper Trapezius Response  Palpable increased muscle length    Levator Scapulae Response  Palpable increased muscle length    Rhomboids Response  Twitch response elicited;Palpable increased muscle length    Infraspinatus Response  Twitch response elicited    Longissimus Response  Twitch response elicited;Palpable increased muscle length    Lower trapezius Response  Palpable increased muscle length;Twitch response elicited    Middle trapezius Response  Twitch response elicited;Palpable increased muscle length           PT Education - 11/30/19 1426    Education Details  Patient educated on new HEP and  response to TPDN    Person(s) Educated  Patient    Methods  Explanation;Handout    Comprehension  Verbalized understanding          PT Long Term Goals - 11/29/19 1418      PT LONG TERM GOAL #1   Title  will be able to take deep breaths without pain    Baseline  pain around levle of T5    Time  6    Period  Weeks    Status  New    Target Date  01/13/20      PT LONG TERM GOAL #2   Title  cervical rotation equal bilaterally    Baseline  see flowsheet    Time  6    Period  Weeks    Status  New    Target Date  01/13/20      PT LONG TERM GOAL #3   Title  resolution of referred symptoms to Rt UE    Baseline  frequent since onset of pain    Time  6    Period  Weeks    Status  New    Target Date  01/13/20      PT LONG TERM GOAL #4   Title  pt will demonstrate proper resting posture    Baseline  forward head, Rt GHJ elevation    Time  6    Period  Weeks    Status  New    Target Date  01/13/20            Plan - 11/30/19 1418    Clinical Impression Statement  Patient presents to the clinic with continued pain and difficulty with breathing. Cervical ROM increased in all directions. TPDN was performed over the paraspinals, upper/middle/lower traps, rhomboids, and infraspinatus. Cervical ROM continued to improved after needling. She indicated that she was a to breathe better had a significant decrease in pain after TPDN. Patient was given new exercises to help relieve thoracic and cervical pain.    Examination-Activity Limitations  Stand;Lift;Bend;Carry;Sit;Sleep    Examination-Participation Restrictions  Other    Stability/Clinical Decision Making  Stable/Uncomplicated    Clinical Decision Making  Low    Rehab Potential  Good    PT Frequency  2x / week  PT Treatment/Interventions  ADLs/Self Care Home Management;Cryotherapy;Electrical Stimulation;Ultrasound;Traction;Moist Heat;Iontophoresis 40m/ml Dexamethasone;Functional mobility training;Therapeutic  activities;Therapeutic exercise;Patient/family education;Neuromuscular re-education;Manual techniques;Taping;Dry needling;Passive range of motion;Spinal Manipulations;Joint Manipulations    PT Next Visit Plan  continue DN PRN, extension mobility    PT Home Exercise Plan  [id number]   Consulted and Agree with Plan of Care  Patient       Patient will benefit from skilled therapeutic intervention in order to improve the following deficits and impairments:  Improper body mechanics, Pain, Postural dysfunction, Increased muscle spasms, Decreased activity tolerance, Decreased range of motion, Impaired UE functional use  Visit Diagnosis: Acute pain of right shoulder  Cervicalgia     Problem List Patient Active Problem List   Diagnosis Date Noted  . Left tennis elbow 05/05/2019  . Neck pain 03/01/2019  . Adult ADHD 02/17/2018  . 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 Vitamin D deficiency 07/25/2015  . Vitamin B 12 deficiency 07/25/2015  . Perennial allergic rhinitis 09/26/2014  . IBS (irritable bowel syndrome) 05/10/2014  . Other and unspecified hyperlipidemia 12/15/2012  . Overactive bladder 05/04/2012  . Asthma, moderate persistent 01/14/2012    Marissa Horn Marissa Horn PT, DPT 11/30/19 2:34 PM   [facility] [place] [address]Phone: [phone]  Fax:  [phone] Name: Marissa MartonMRN: [id number] of Birth: 605-23-1972

## 2019-12-04 ENCOUNTER — Encounter: Payer: Self-pay | Admitting: Orthopaedic Surgery

## 2019-12-05 ENCOUNTER — Ambulatory Visit: Payer: 59 | Admitting: Orthopaedic Surgery

## 2019-12-05 ENCOUNTER — Other Ambulatory Visit: Payer: Self-pay

## 2019-12-05 DIAGNOSIS — M542 Cervicalgia: Secondary | ICD-10-CM

## 2019-12-05 MED ORDER — METHOCARBAMOL 500 MG PO TABS: 500.0000 mg | ORAL_TABLET | Freq: Four times a day (QID) | ORAL | 0 refills | Status: DC | PRN

## 2019-12-05 MED ORDER — METHYLPREDNISOLONE ACETATE 40 MG/ML IJ SUSP
40.0000 mg | INTRAMUSCULAR | Status: AC | PRN
  Administered 2019-12-05: 40 mg via INTRAMUSCULAR

## 2019-12-05 MED ORDER — LIDOCAINE HCL 1 % IJ SOLN
1.0000 mL | INTRAMUSCULAR | Status: AC | PRN
  Administered 2019-12-05: 1 mL

## 2019-12-05 MED ORDER — METHYLPREDNISOLONE 4 MG PO TABS: ORAL_TABLET | ORAL | 0 refills | Status: DC

## 2019-12-05 MED FILL — METHOCARBAMOL 500 MG TABS: 500 | 15 days supply | Qty: 60 | Fill #0

## 2019-12-05 MED FILL — METHYLPREDNISOLONE 4 MG TBP: 4 | 6 days supply | Qty: 21 | Fill #0

## 2019-12-05 NOTE — Progress Notes (Signed)
Office Visit Note   Patient: Marissa Horn           Date of Birth: October 14, 1970           MRN: [id number] Visit Date: 12/05/2019              Requested by: Eulas Post, MD [place],  [place] [address] PCP: Eulas Post, MD   Assessment & Plan: Visit Diagnoses:  1. Cervicalgia     Plan: Both the patient and I felt that a trigger point injection would be worthwhile in the point of maximum tenderness in the parascapular area on the right side.  I did find this area cleaned with alcohol and then placed an injection of 1 cc of lidocaine and 1 cc Depo-Medrol around this area.  I will try a 6-day steroid taper and some Robaxin.  We can see her back in 2 weeks to see if this is providing her any comfort or relief.  Follow-Up Instructions: Return in about 2 weeks (around 12/19/2019).   Orders:  No orders of the defined types were placed in this encounter.  Meds ordered this encounter  Medications  . methylPREDNISolone (MEDROL) 4 MG tablet    Sig: Medrol dose pack. Take as instructed    Dispense:  21 tablet    Refill:  0  . methocarbamol (ROBAXIN) 500 MG tablet    Sig: Take 1 tablet (500 mg total) by mouth every 6 (six) hours as needed for muscle spasms.    Dispense:  60 tablet    Refill:  0      Procedures: Trigger Point Inj  Date/Time: 12/05/2019 5:04 PM Performed by: Mcarthur Rossetti, MD Authorized by: Mcarthur Rossetti, MD   Total # of Trigger Points:  1 Medications #1:  1 mL lidocaine 1 %; 40 mg methylPREDNISolone acetate 40 MG/ML     Clinical Data: No additional findings.   Subjective: Chief Complaint  Patient presents with  . Right Shoulder - Pain  The patient is a 49 year old I am seeing for the first time as a work in today.  She has seen Dr. Durward Fortes before but has had acute flareup of parascapular pain to the right side.  She has tried dry needling and that has not helped.  Is really gotten better over the  last week to 2 weeks.  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  She does have spinal stenosis with flattening of the left cord and this is all from an MRI from 8/20.  She is denying any numbness and tingling in her hands or weakness in the arms.  She points to the parascapular area as the main source of her pain.  HPI  Review of Systems There is currently no headache, chest pain, shortness of breath, fever, chills, nausea, vomiting  Objective: Vital Signs: LMP 04/14/2012   Physical Exam She is alert and orient x3 and in no acute distress Ortho Exam On examination she has no significant weakness in her bilateral upper extremities.  She has a lot of pain in the parascapular area to palpation on the right side and obvious trigger point in this area. Specialty Comments:  No specialty comments available.  Imaging: No results found.   PMFS History: Patient Active Problem List   Diagnosis Date Noted  . Left tennis elbow 05/05/2019  . Neck pain 03/01/2019  . Adult ADHD 02/17/2018  .  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 Vitamin D deficiency 07/25/2015  . Vitamin B 12 deficiency 07/25/2015  . Perennial allergic rhinitis 09/26/2014  . IBS (irritable bowel syndrome) 05/10/2014  . Other and unspecified hyperlipidemia 12/15/2012  . Overactive bladder 05/04/2012  . Asthma, moderate persistent 01/14/2012   Past Medical History:  Diagnosis Date  . Adult ADHD   . Allergy   . Anemia   . Arthritis    hands  . Asthma    ?, inhaler use as needed  . Complication of anesthesia 1990   muscle relaxant caused lung to collapse  . Difficult intubation 1990  . Dyslipidemia   . History of chicken pox   . IBS (irritable bowel syndrome)    ?  [name] Kitchen Migraine   . UTI (lower urinary tract infection)     Family History  Problem Relation Age of Onset  . Arthritis Mother   . Hyperlipidemia Mother   . Glaucoma Mother   .  Irritable bowel syndrome Mother   . Cataracts Mother   . Hyperlipidemia Father   . Heart disease Father 8       CAD  . Cataracts Father   . Thyroid disease Sister   . Hyperlipidemia Brother   . Ulcerative colitis Brother     Past Surgical History:  Procedure Laterality Date  . ABDOMINAL HYSTERECTOMY  05/2012  . ABDOMINOPLASTY  05/04/2012   Procedure: ABDOMINOPLASTY;  Surgeon: Crissie Reese, MD;  Location: [facility];  Service: Plastics;  Laterality: N/A;  . ANAL FISSURE REPAIR     age 85  . EYE SURGERY Left    age 7  . GYNECOLOGIC CRYOSURGERY    . WISDOM TOOTH EXTRACTION     Social History   Occupational History  . Occupation: clinical dietitian  Tobacco Use  . Smoking status: Never Smoker  . Smokeless tobacco: Never Used  Substance and Sexual Activity  . Alcohol use: No  . Drug use: No  . Sexual activity: Yes    Partners: Male    Birth control/protection: Surgical    Comment: hysterectomy

## 2019-12-16 ENCOUNTER — Encounter: Payer: Self-pay | Admitting: Physical Therapy

## 2019-12-16 ENCOUNTER — Other Ambulatory Visit: Payer: Self-pay

## 2019-12-16 ENCOUNTER — Ambulatory Visit: Payer: 59 | Attending: Family Medicine | Admitting: Physical Therapy

## 2019-12-16 DIAGNOSIS — M25511 Pain in right shoulder: Secondary | ICD-10-CM | POA: Insufficient documentation

## 2019-12-16 DIAGNOSIS — M542 Cervicalgia: Secondary | ICD-10-CM | POA: Diagnosis not present

## 2019-12-16 NOTE — Therapy (Signed)
[place], [place], [address] Phone: [phone]   Fax:  [phone]  Physical Therapy Treatment  Patient Details  Name: Marissa Horn MRN: [id number] Date of Birth: 1970/08/24 Referring Provider (PT): Marissa Post, MD   Encounter Date: 12/16/2019  PT End of Session - 12/16/19 1053    Visit Number  3    Number of Visits  13    Date for PT Re-Evaluation  01/10/20    Authorization Type  UMR    PT Start Time  1015    PT Stop Time  1053    PT Time Calculation (min)  38 min    Activity Tolerance  Patient tolerated treatment well    Behavior During Therapy  [facility] for tasks assessed/performed       Past Medical History:  Diagnosis Date  . Adult ADHD   . Allergy   . Anemia   . Arthritis    hands  . Asthma    ?, inhaler use as needed  . Complication of anesthesia 1990   muscle relaxant caused lung to collapse  . Difficult intubation 1990  . Dyslipidemia   . History of chicken pox   . IBS (irritable bowel syndrome)    ?  Marissa Horn Kitchen Migraine   . UTI (lower urinary tract infection)     Past Surgical History:  Procedure Laterality Date  . ABDOMINAL HYSTERECTOMY  05/2012  . ABDOMINOPLASTY  05/04/2012   Procedure: ABDOMINOPLASTY;  Surgeon: Crissie Reese, MD;  Location: [facility];  Service: Plastics;  Laterality: N/A;  . ANAL FISSURE REPAIR     age 61  . EYE SURGERY Left    age 60  . GYNECOLOGIC CRYOSURGERY    . WISDOM TOOTH EXTRACTION      There were no vitals filed for this visit.  Subjective Assessment - 12/16/19 1018    Subjective  The trigger point injection did not seem to do anything but the steroids are helpful and it is about 90% better. Some pain at a DN site at lateral aspect of Rt scapula    Patient Stated Goals  decrease pain, look down at phone    Currently in Pain?  Yes    Pain Score  2     Pain Location  Axilla    Aggravating Factors   evening- starting to sit down and rest    Pain Relieving Factors  laying down                        [facility] Adult PT Treatment/Exercise - 12/16/19 0001      Modalities   Modalities  Ultrasound      Ultrasound   Ultrasound Location  Rt axila    Ultrasound Parameters  2.0 w/cm cont    Ultrasound Goals  Pain      Manual Therapy   Manual therapy comments  GHj stretching    Soft tissue mobilization  bil upper trap, Rt lats             PT Education - 12/16/19 1053    Education Details  DN vs Trigger point injection vs steroids, anatomy of condition    Person(s) Educated  Patient    Methods  Explanation    Comprehension  Verbalized understanding;Need further instruction          PT Long Term Goals - 11/29/19 1418      PT LONG TERM GOAL #1   Title  will be able to take deep breaths without pain    Baseline  pain around levle of T5    Time  6    Period  Weeks    Status  New    Target Date  01/13/20      PT LONG TERM GOAL #2   Title  cervical rotation equal bilaterally    Baseline  see flowsheet    Time  6    Period  Weeks    Status  New    Target Date  01/13/20      PT LONG TERM GOAL #3   Title  resolution of referred symptoms to Rt UE    Baseline  frequent since onset of pain    Time  6    Period  Weeks    Status  New    Target Date  01/13/20      PT LONG TERM GOAL #4   Title  pt will demonstrate proper resting posture    Baseline  forward head, Rt GHJ elevation    Time  6    Period  Weeks    Status  New    Target Date  01/13/20            Plan - 12/16/19 1054    Clinical Impression Statement  Tightness noted in Rt lats reduced with [place] & manual trigger point release. Ischemic release to bil upper traps to make her feel "even" across her shoulders. pt reported feeling good after treatment and will progress as appropriate at next visit.    PT Treatment/Interventions  ADLs/Self Care Home Management;Cryotherapy;Electrical Stimulation;Ultrasound;Traction;Moist  Heat;Iontophoresis 75m/ml Dexamethasone;Functional mobility training;Therapeutic activities;Therapeutic exercise;Patient/family education;Neuromuscular re-education;Manual techniques;Taping;Dry needling;Passive range of motion;Spinal Manipulations;Joint Manipulations    PT Next Visit Plan  stenosis- flexion program    PT Home Exercise Plan  [id number]   Consulted and Agree with Plan of Care  Patient       Patient will benefit from skilled therapeutic intervention in order to improve the following deficits and impairments:  Improper body mechanics, Pain, Postural dysfunction, Increased muscle spasms, Decreased activity tolerance, Decreased range of motion, Impaired UE functional use  Visit Diagnosis: Acute pain of right shoulder     Problem List Patient Active Problem List   Diagnosis Date Noted  . Left tennis elbow 05/05/2019  . Neck pain 03/01/2019  . Adult ADHD 02/17/2018  . 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 Vitamin D deficiency 07/25/2015  . Vitamin B 12 deficiency 07/25/2015  . Perennial allergic rhinitis 09/26/2014  . IBS (irritable bowel syndrome) 05/10/2014  . Other and unspecified hyperlipidemia 12/15/2012  . Overactive bladder 05/04/2012  . Asthma, moderate persistent 01/14/2012   Marissa Horn C. Martita Brumm PT, DPT 12/16/19 10:57 AM   CE[facility] [address] [place] [address]Phone: [phone]  Fax:  [phone] Name: Marissa ShatzMRN: [id number] of Birth: [birth year]

## 2019-12-20 ENCOUNTER — Encounter: Payer: Self-pay | Admitting: Physical Therapy

## 2019-12-20 ENCOUNTER — Other Ambulatory Visit: Payer: Self-pay

## 2019-12-20 ENCOUNTER — Ambulatory Visit: Payer: 59 | Admitting: Physical Therapy

## 2019-12-20 DIAGNOSIS — M25511 Pain in right shoulder: Secondary | ICD-10-CM | POA: Diagnosis not present

## 2019-12-20 DIAGNOSIS — M542 Cervicalgia: Secondary | ICD-10-CM | POA: Diagnosis not present

## 2019-12-20 NOTE — Therapy (Signed)
[place], [place], [address] Phone: [phone]   Fax:  [phone]  Physical Therapy Treatment  Patient Details  Name: Marissa Horn MRN: [id number] Date of Birth: 06/30/1971 Referring Provider (PT): Eulas Post, MD   Encounter Date: 12/20/2019  PT End of Session - 12/20/19 1046    Visit Number  4    Number of Visits  13    Date for PT Re-Evaluation  01/10/20    Authorization Type  UMR    PT Start Time  1027   pt arrived late   PT Stop Time  1045    PT Time Calculation (min)  18 min    Activity Tolerance  Patient tolerated treatment well    Behavior During Therapy  [facility] for tasks assessed/performed       Past Medical History:  Diagnosis Date  . Adult ADHD   . Allergy   . Anemia   . Arthritis    hands  . Asthma    ?, inhaler use as needed  . Complication of anesthesia 1990   muscle relaxant caused lung to collapse  . Difficult intubation 1990  . Dyslipidemia   . History of chicken pox   . IBS (irritable bowel syndrome)    ?  [name] Kitchen Migraine   . UTI (lower urinary tract infection)     Past Surgical History:  Procedure Laterality Date  . ABDOMINAL HYSTERECTOMY  05/2012  . ABDOMINOPLASTY  05/04/2012   Procedure: ABDOMINOPLASTY;  Surgeon: Crissie Reese, MD;  Location: [facility];  Service: Plastics;  Laterality: N/A;  . ANAL FISSURE REPAIR     age 89  . EYE SURGERY Left    age 81  . GYNECOLOGIC CRYOSURGERY    . WISDOM TOOTH EXTRACTION      There were no vitals filed for this visit.  Subjective Assessment - 12/20/19 1027    Subjective  Had some pain yesterday driving to [place] and back but nothing really now.    Patient Stated Goals  decrease pain, look down at phone         [facility] PT Assessment - 12/20/19 0001      AROM   Cervical Flexion  40    Cervical - Right Side Bend  34    Cervical - Left Side Bend  35    Cervical - Right Rotation  52    Cervical - Left  Rotation  58                    [facility] Adult PT Treatment/Exercise - 12/20/19 0001      Neck Exercises: Supine   Neck Retraction Limitations  chin tuck with cervical rotation      Manual Therapy   Manual Therapy  Manual Traction    Joint Mobilization  cervical traction paired with rotation    Soft tissue mobilization  bil upper traps & scalenes, suboccipital release    Manual Traction  cervical                  PT Long Term Goals - 12/20/19 1027      PT LONG TERM GOAL #1   Title  will be able to take deep breaths without pain    Status  Achieved      PT LONG TERM GOAL #2   Title  cervical rotation equal bilaterally    Baseline  see flowsheet    Status  On-going  PT LONG TERM GOAL #3   Title  resolution of referred symptoms to Rt UE      PT LONG TERM GOAL #4   Title  pt will demonstrate proper resting posture    Status  Achieved            Plan - 12/20/19 1047    Clinical Impression Statement  Limited Rt rotation in seated position with tightness noted. Able to distract and rotate at least 70 deg passively in supine. Forward head posture is resulting in limited joint mobility in rotation and tightness in scalenes and levator scap. Pt was able to perform greater rotation in supine with chin tuck and will practice at home.    PT Treatment/Interventions  ADLs/Self Care Home Management;Cryotherapy;Electrical Stimulation;Ultrasound;Traction;Moist Heat;Iontophoresis 65m/ml Dexamethasone;Functional mobility training;Therapeutic activities;Therapeutic exercise;Patient/family education;Neuromuscular re-education;Manual techniques;Taping;Dry needling;Passive range of motion;Spinal Manipulations;Joint Manipulations    PT Next Visit Plan  progress DNF strength    PT Home Exercise Plan  [id number]   Consulted and Agree with Plan of Care  Patient       Patient will benefit from skilled therapeutic intervention in order to improve the following deficits and  impairments:  Improper body mechanics, Pain, Postural dysfunction, Increased muscle spasms, Decreased activity tolerance, Decreased range of motion, Impaired UE functional use  Visit Diagnosis: Acute pain of right shoulder  Cervicalgia     Problem List Patient Active Problem List   Diagnosis Date Noted  . Left tennis elbow 05/05/2019  . Neck pain 03/01/2019  . Adult ADHD 02/17/2018  . 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 Vitamin D deficiency 07/25/2015  . Vitamin B 12 deficiency 07/25/2015  . Perennial allergic rhinitis 09/26/2014  . IBS (irritable bowel syndrome) 05/10/2014  . Other and unspecified hyperlipidemia 12/15/2012  . Overactive bladder 05/04/2012  . Asthma, moderate persistent 01/14/2012    Nanette Wirsing C. Tessica Cupo PT, DPT 12/20/19 10:50 AM   CW[facility] [address]GBro[address] [address]Phone: [phone]  Fax:  [phone] Name: Marissa YogiMRN: [id number] of Birth: [birth year]

## 2019-12-23 ENCOUNTER — Encounter: Payer: Self-pay | Admitting: Physical Therapy

## 2019-12-23 ENCOUNTER — Ambulatory Visit: Payer: 59 | Admitting: Physical Therapy

## 2019-12-23 ENCOUNTER — Other Ambulatory Visit: Payer: Self-pay

## 2019-12-23 DIAGNOSIS — M542 Cervicalgia: Secondary | ICD-10-CM

## 2019-12-23 DIAGNOSIS — M25511 Pain in right shoulder: Secondary | ICD-10-CM

## 2019-12-23 NOTE — Therapy (Signed)
[place], [place], [address] Phone: [phone]   Fax:  [phone]  Physical Therapy Treatment  Patient Details  Name: Marissa Horn MRN: [id number] Date of Birth: August 08, 1970 Referring Provider (PT): Eulas Post, MD   Encounter Date: 12/23/2019  PT End of Session - 12/23/19 1129    Visit Number  5    Number of Visits  13    Date for PT Re-Evaluation  01/10/20    Authorization Type  UMR    PT Start Time  1105    PT Stop Time  1129    PT Time Calculation (min)  24 min    Activity Tolerance  Patient tolerated treatment well    Behavior During Therapy  [facility] for tasks assessed/performed       Past Medical History:  Diagnosis Date  . Adult ADHD   . Allergy   . Anemia   . Arthritis    hands  . Asthma    ?, inhaler use as needed  . Complication of anesthesia 1990   muscle relaxant caused lung to collapse  . Difficult intubation 1990  . Dyslipidemia   . History of chicken pox   . IBS (irritable bowel syndrome)    ?  [name] Kitchen Migraine   . UTI (lower urinary tract infection)     Past Surgical History:  Procedure Laterality Date  . ABDOMINAL HYSTERECTOMY  05/2012  . ABDOMINOPLASTY  05/04/2012   Procedure: ABDOMINOPLASTY;  Surgeon: Crissie Reese, MD;  Location: [facility];  Service: Plastics;  Laterality: N/A;  . ANAL FISSURE REPAIR     age 49  . EYE SURGERY Left    age 49  . GYNECOLOGIC CRYOSURGERY    . WISDOM TOOTH EXTRACTION      There were no vitals filed for this visit.  Subjective Assessment - 12/23/19 1106    Subjective  From late afternoon on it is bad. the area we addressed last time got worse. The same night after my last session it went backward. When I have a night sleep it almost resets.    Currently in Pain?  Yes    Pain Score  6     Pain Location  Back    Pain Orientation  Right;Mid                        [facility] Adult PT Treatment/Exercise - 12/23/19  0001      Ultrasound   Ultrasound Location  Rt subscap from axillary region with arm OH    Ultrasound Parameters  2.0 cont    Ultrasound Goals  Pain      Manual Therapy   Soft tissue mobilization  Rt subscap                  PT Long Term Goals - 12/20/19 1027      PT LONG TERM GOAL #1   Title  will be able to take deep breaths without pain    Status  Achieved      PT LONG TERM GOAL #2   Title  cervical rotation equal bilaterally    Baseline  see flowsheet    Status  On-going      PT LONG TERM GOAL #3   Title  resolution of referred symptoms to Rt UE      PT LONG TERM GOAL #4   Title  pt will demonstrate proper resting posture    Status  Achieved            Plan - 12/23/19 1132    Clinical Impression Statement  Combination of [place] with STM to reduce trigger point in Rt subscapularis was successful in reducing pain. Reported noticing some tightness in Rt upper trap following but felt better. Will progress as appropriate at next appt.    PT Treatment/Interventions  ADLs/Self Care Home Management;Cryotherapy;Electrical Stimulation;Ultrasound;Traction;Moist Heat;Iontophoresis 35m/ml Dexamethasone;Functional mobility training;Therapeutic activities;Therapeutic exercise;Patient/family education;Neuromuscular re-education;Manual techniques;Taping;Dry needling;Passive range of motion;Spinal Manipulations;Joint Manipulations    PT Next Visit Plan  progress DNF strength    PT Home Exercise Plan  [id number]   Consulted and Agree with Plan of Care  Patient       Patient will benefit from skilled therapeutic intervention in order to improve the following deficits and impairments:  Improper body mechanics, Pain, Postural dysfunction, Increased muscle spasms, Decreased activity tolerance, Decreased range of motion, Impaired UE functional use  Visit Diagnosis: Acute pain of right shoulder  Cervicalgia     Problem List Patient Active Problem List   Diagnosis Date Noted   . Left tennis elbow 05/05/2019  . Neck pain 03/01/2019  . Adult ADHD 02/17/2018  . 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 Vitamin D deficiency 07/25/2015  . Vitamin B 12 deficiency 07/25/2015  . Perennial allergic rhinitis 09/26/2014  . IBS (irritable bowel syndrome) 05/10/2014  . Other and unspecified hyperlipidemia 12/15/2012  . Overactive bladder 05/04/2012  . Asthma, moderate persistent 01/14/2012    Nabiha Planck C. Ashonti Leandro PT, DPT 12/23/19 11:36 AM   [facility]GBattle [place] [address]Phone: [phone]  Fax:  [phone] Name: ALurae HornbrookMRN: [id number] of Birth: [birth year]

## 2019-12-26 ENCOUNTER — Encounter: Payer: Self-pay | Admitting: Physical Therapy

## 2019-12-26 ENCOUNTER — Other Ambulatory Visit: Payer: Self-pay

## 2019-12-26 ENCOUNTER — Ambulatory Visit: Payer: 59 | Admitting: Physical Therapy

## 2019-12-26 DIAGNOSIS — M542 Cervicalgia: Secondary | ICD-10-CM | POA: Diagnosis not present

## 2019-12-26 DIAGNOSIS — M25511 Pain in right shoulder: Secondary | ICD-10-CM | POA: Diagnosis not present

## 2019-12-26 NOTE — Therapy (Signed)
[place], [place], [address] Phone: [phone]   Fax:  [phone]  Physical Therapy Treatment  Patient Details  Name: Marissa Horn MRN: [id number] Date of Birth: [birth year] Referring Provider (PT): Eulas Post, MD   Encounter Date: 12/26/2019  PT End of Session - 12/26/19 1027    Visit Number  6    Number of Visits  13    Date for PT Re-Evaluation  01/10/20    Authorization Type  UMR    PT Start Time  1025   pt arrived late   PT Stop Time  1055    PT Time Calculation (min)  30 min    Activity Tolerance  Patient tolerated treatment well    Behavior During Therapy  [facility] for tasks assessed/performed       Past Medical History:  Diagnosis Date  . Adult ADHD   . Allergy   . Anemia   . Arthritis    hands  . Asthma    ?, inhaler use as needed  . Complication of anesthesia 1990   muscle relaxant caused lung to collapse  . Difficult intubation 1990  . Dyslipidemia   . History of chicken pox   . IBS (irritable bowel syndrome)    ?  [name] Kitchen Migraine   . UTI (lower urinary tract infection)     Past Surgical History:  Procedure Laterality Date  . ABDOMINAL HYSTERECTOMY  05/2012  . ABDOMINOPLASTY  05/04/2012   Procedure: ABDOMINOPLASTY;  Surgeon: Crissie Reese, MD;  Location: [facility];  Service: Plastics;  Laterality: N/A;  . ANAL FISSURE REPAIR     age 49  . EYE SURGERY Left    age 49  . GYNECOLOGIC CRYOSURGERY    . WISDOM TOOTH EXTRACTION      There were no vitals filed for this visit.  Subjective Assessment - 12/26/19 1025    Subjective  Yesterday I was fine after being sore. Some soreness today in same area- pointing to subscap. Tension in back today.    Patient Stated Goals  decrease pain, look down at phone    Currently in Pain?  Yes    Pain Score  5     Pain Location  Back    Pain Descriptors / Indicators  --   tensiion                       [facility] Adult  PT Treatment/Exercise - 12/26/19 0001      Ultrasound   Ultrasound Location  Rt subscap in prone, GHJ flx    Ultrasound Parameters  2.0 cont    Ultrasound Goals  Pain      Manual Therapy   Joint Mobilization  scapular mobilization and distraction    Soft tissue mobilization  Rt subscap stretching  & TP release                  PT Long Term Goals - 12/20/19 1027      PT LONG TERM GOAL #1   Title  will be able to take deep breaths without pain    Status  Achieved      PT LONG TERM GOAL #2   Title  cervical rotation equal bilaterally    Baseline  see flowsheet    Status  On-going      PT LONG TERM GOAL #3   Title  resolution of referred symptoms to Rt UE  PT LONG TERM GOAL #4   Title  pt will demonstrate proper resting posture    Status  Achieved            Plan - 12/26/19 1056    Clinical Impression Statement  Rt scapula bound down significantly. Was able to minimally distract following other manual therapy techniques to decrease soft tissue tension but would benefit from further distraction. Pt reported significant increase in symptoms following.    PT Treatment/Interventions  ADLs/Self Care Home Management;Cryotherapy;Electrical Stimulation;Ultrasound;Traction;Moist Heat;Iontophoresis 30m/ml Dexamethasone;Functional mobility training;Therapeutic activities;Therapeutic exercise;Patient/family education;Neuromuscular re-education;Manual techniques;Taping;Dry needling;Passive range of motion;Spinal Manipulations;Joint Manipulations    PT Home Exercise Plan  [email or web address]   Consulted and Agree with Plan of Care  Patient       Patient will benefit from skilled therapeutic intervention in order to improve the following deficits and impairments:  Improper body mechanics, Pain, Postural dysfunction, Increased muscle spasms, Decreased activity tolerance, Decreased range of motion, Impaired UE functional use  Visit Diagnosis: Acute pain of right  shoulder  Cervicalgia     Problem List Patient Active Problem List   Diagnosis Date Noted  . Left tennis elbow 05/05/2019  . Neck pain 03/01/2019  . Adult ADHD 02/17/2018  . 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 Vitamin D deficiency 07/25/2015  . Vitamin B 12 deficiency 07/25/2015  . Perennial allergic rhinitis 09/26/2014  . IBS (irritable bowel syndrome) 05/10/2014  . Other and unspecified hyperlipidemia 12/15/2012  . Overactive bladder 05/04/2012  . Asthma, moderate persistent 01/14/2012    Marissa Horn C. Marissa Horn PT, DPT 12/26/19 10:58 AM   [facility]GUdall [place] [address]Phone: [phone]  Fax:  [phone] Name: Marissa KasparekMRN: [id number] of Birth: 48Feb 15, 1972

## 2019-12-30 ENCOUNTER — Ambulatory Visit: Payer: 59 | Admitting: Physical Therapy

## 2020-01-03 ENCOUNTER — Ambulatory Visit: Payer: 59 | Admitting: Physical Therapy

## 2020-01-04 ENCOUNTER — Ambulatory Visit: Payer: 59 | Attending: Family Medicine | Admitting: Physical Therapy

## 2020-01-04 ENCOUNTER — Encounter: Payer: Self-pay | Admitting: Physical Therapy

## 2020-01-04 ENCOUNTER — Other Ambulatory Visit: Payer: Self-pay

## 2020-01-04 DIAGNOSIS — M542 Cervicalgia: Secondary | ICD-10-CM | POA: Diagnosis not present

## 2020-01-04 DIAGNOSIS — M25511 Pain in right shoulder: Secondary | ICD-10-CM | POA: Insufficient documentation

## 2020-01-04 NOTE — Therapy (Signed)
[place], [place], [address] Phone: [phone]   Fax:  [phone]  Physical Therapy Treatment/Discharge  Patient Details  Name: Marissa Horn MRN: [id number] Date of Birth: 1971-06-26 Referring Provider (PT): Eulas Post, MD   Encounter Date: 01/04/2020  PT End of Session - 01/04/20 1747    Visit Number  7    Number of Visits  13    Date for PT Re-Evaluation  01/10/20    Authorization Type  UMR    PT Start Time  1716    PT Stop Time  1744    PT Time Calculation (min)  28 min    Activity Tolerance  Patient tolerated treatment well    Behavior During Therapy  [facility] for tasks assessed/performed       Past Medical History:  Diagnosis Date  . Adult ADHD   . Allergy   . Anemia   . Arthritis    hands  . Asthma    ?, inhaler use as needed  . Complication of anesthesia 1990   muscle relaxant caused lung to collapse  . Difficult intubation 1990  . Dyslipidemia   . History of chicken pox   . IBS (irritable bowel syndrome)    ?  [name] Kitchen Migraine   . UTI (lower urinary tract infection)     Past Surgical History:  Procedure Laterality Date  . ABDOMINAL HYSTERECTOMY  05/2012  . ABDOMINOPLASTY  05/04/2012   Procedure: ABDOMINOPLASTY;  Surgeon: Crissie Reese, MD;  Location: [facility];  Service: Plastics;  Laterality: N/A;  . ANAL FISSURE REPAIR     age 49  . EYE SURGERY Left    age 77  . GYNECOLOGIC CRYOSURGERY    . WISDOM TOOTH EXTRACTION      There were no vitals filed for this visit.  Subjective Assessment - 01/04/20 1718    Subjective  Huge improvements. Once in a while feel 2-3 pain in the evening. I feel more tension across bil upper traps.    Patient Stated Goals  decrease pain, look down at phone         [facility] PT Assessment - 01/04/20 0001      Assessment   Medical Diagnosis  shoulder pain    Referring Provider (PT)  Eulas Post, MD      AROM   Cervical - Right  Rotation  64    Cervical - Left Rotation  70      Palpation   Palpation comment  limited scapular mobility                    [facility] Adult PT Treatment/Exercise - 01/04/20 0001      Exercises   Other Exercises   quadruped protaction/retraction, sidelying subscap stretch, rhomboid stretch      Manual Therapy   Soft tissue mobilization  bil scapular distraction             PT Education - 01/04/20 1751    Education Details  goals, anatomy of condition    Person(s) Educated  Patient    Methods  Explanation    Comprehension  Verbalized understanding          PT Long Term Goals - 01/04/20 1720      PT LONG TERM GOAL #1   Title  will be able to take deep breaths without pain    Status  Achieved      PT LONG TERM GOAL #2  Title  cervical rotation equal bilaterally    Baseline  Lt 70  Rt 58      PT LONG TERM GOAL #3   Title  resolution of referred symptoms to Rt UE    Status  Achieved      PT LONG TERM GOAL #4   Title  pt will demonstrate proper resting posture    Status  Achieved            Plan - 01/04/20 1728    Clinical Impression Statement  Rt cervical rotation incr to 64 deg following scapular distraction. Was able to rotate to the Left without pain following distraction of Lt scapula. Pt feels good about her function and is prepared to d/c to independent program. provided with final HEP printout and encouraged her to contact us with any questions.    PT Treatment/Interventions  ADLs/Self Care Home Management;Cryotherapy;Electrical Stimulation;Ultrasound;Traction;Moist Heat;Iontophoresis 31m/ml Dexamethasone;Functional mobility training;Therapeutic activities;Therapeutic exercise;Patient/family education;Neuromuscular re-education;Manual techniques;Taping;Dry needling;Passive range of motion;Spinal Manipulations;Joint Manipulations    PT Home Exercise Plan  [id number]   Consulted and Agree with Plan of Care  Patient       Patient will  benefit from skilled therapeutic intervention in order to improve the following deficits and impairments:  Improper body mechanics, Pain, Postural dysfunction, Increased muscle spasms, Decreased activity tolerance, Decreased range of motion, Impaired UE functional use  Visit Diagnosis: Acute pain of right shoulder  Cervicalgia     Problem List Patient Active Problem List   Diagnosis Date Noted  . Left tennis elbow 05/05/2019  . Neck pain 03/01/2019  . Adult ADHD 02/17/2018  . 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 Vitamin D deficiency 07/25/2015  . Vitamin B 12 deficiency 07/25/2015  . Perennial allergic rhinitis 09/26/2014  . IBS (irritable bowel syndrome) 05/10/2014  . Other and unspecified hyperlipidemia 12/15/2012  . Overactive bladder 05/04/2012  . Asthma, moderate persistent 01/14/2012   PHYSICAL THERAPY DISCHARGE SUMMARY  Visits from Start of Care: 7  Current functional level related to goals / functional outcomes: See above   Remaining deficits: See above   Education / Equipment: Anatomy of condition, POC, HEP, exercise form/rationale  Plan: Patient agrees to discharge.  Patient goals were met. Patient is being discharged due to being pleased with the current functional level.  ?????     Laurrie Toppin C. Jency Schnieders PT, DPT 01/04/20 5:52 PM   [facility] CG[facility] LLC[address]GM[address] [address]Phone: [phone]  Fax:  [phone] Name: APayal StanforthMRN: [id number] of Birth: 6November 22, 1972

## 2020-04-08 ENCOUNTER — Encounter: Payer: Self-pay | Admitting: Family Medicine

## 2020-04-10 ENCOUNTER — Other Ambulatory Visit: Payer: Self-pay | Admitting: Family Medicine

## 2020-04-10 MED ORDER — AMPHETAMINE-DEXTROAMPHETAMINE 10 MG PO TABS: ORAL_TABLET | ORAL | 0 refills | Status: DC

## 2020-04-10 MED ORDER — AMPHETAMINE-DEXTROAMPHETAMINE 10 MG PO TABS: 10.0000 mg | ORAL_TABLET | Freq: Two times a day (BID) | ORAL | 0 refills | Status: DC

## 2020-04-10 MED FILL — AMPHETAMINE SALTS 10 MG: 10 | 30 days supply | Qty: 60 | Fill #0

## 2020-04-10 NOTE — Telephone Encounter (Signed)
Please advise 

## 2020-04-10 NOTE — Telephone Encounter (Signed)
Refill sent for 3 months

## 2020-06-22 ENCOUNTER — Other Ambulatory Visit: Payer: Self-pay | Admitting: Family Medicine

## 2020-06-22 ENCOUNTER — Encounter: Payer: Self-pay | Admitting: Family Medicine

## 2020-06-22 MED ORDER — MOMETASONE FUROATE 0.1 % EX CREA
1.0000 | TOPICAL_CREAM | Freq: Every day | CUTANEOUS | 11 refills | Status: DC | PRN
Start: 2020-06-22 — End: 2020-06-22

## 2020-06-22 MED FILL — LEVALBUTEROL TAR HFA 45MCG: 45 | 25 days supply | Qty: 15 | Fill #1

## 2020-06-22 MED FILL — ESTRADIOL 0.5 MG TABS: 0.5 | 90 days supply | Qty: 90 | Fill #1

## 2020-06-22 MED FILL — MOMETASONE FUROATE 0.1 % CR: 0.1 | 30 days supply | Qty: 45 | Fill #0

## 2020-06-22 MED FILL — AMPHETAMINE SALTS 10 MG: 10 | 30 days supply | Qty: 60 | Fill #0

## 2020-06-22 MED FILL — VIT D2 1.25 MG (50,000 UNIT: 1.25 MG | 84 days supply | Qty: 12 | Fill #1

## 2020-08-17 ENCOUNTER — Other Ambulatory Visit: Payer: Self-pay

## 2020-08-17 ENCOUNTER — Encounter: Payer: Self-pay | Admitting: Obstetrics & Gynecology

## 2020-08-17 ENCOUNTER — Ambulatory Visit: Payer: 59 | Admitting: Obstetrics & Gynecology

## 2020-08-17 ENCOUNTER — Other Ambulatory Visit: Payer: Self-pay | Admitting: Obstetrics & Gynecology

## 2020-08-17 VITALS — BP 146/96 | HR 80 | Wt 136.0 lb

## 2020-08-17 DIAGNOSIS — N764 Abscess of vulva: Secondary | ICD-10-CM | POA: Diagnosis not present

## 2020-08-17 MED ORDER — DOXYCYCLINE HYCLATE 100 MG PO CAPS: 100.0000 mg | ORAL_CAPSULE | Freq: Two times a day (BID) | ORAL | 0 refills | Status: DC

## 2020-08-17 MED FILL — DOXYCYCLINE HYCLATE 100 MG: 100 | 7 days supply | Qty: 14 | Fill #0

## 2020-08-17 NOTE — Progress Notes (Signed)
GYNECOLOGY  VISIT  CC:   Labial abscess  HPI: 50 y.o. G3P0202 Married [place] female here for labia abscess that occurred about a week ago.  She did try to compress it and got a little drainage from it.  She reports it is much smaller but still present.  she feels she is still having a little drainage.  Denies fever.  She thinks there is some swelling up towards the clitoris and is a little worried about this.    GYNECOLOGIC HISTORY: Patient's last menstrual period was 04/14/2012. Contraception: hysterectomy Menopausal hormone therapy: estrace 0.71m daily  Patient Active Problem List   Diagnosis Date Noted  . Left tennis elbow 05/05/2019  . Neck pain 03/01/2019  . Adult ADHD 02/17/2018  . 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 Vitamin D deficiency 07/25/2015  . Vitamin B 12 deficiency 07/25/2015  . Perennial allergic rhinitis 09/26/2014  . IBS (irritable bowel syndrome) 05/10/2014  . Other and unspecified hyperlipidemia 12/15/2012  . Overactive bladder 05/04/2012  . Asthma, moderate persistent 01/14/2012    Past Medical History:  Diagnosis Date  . Adult ADHD   . Allergy   . Arthritis    hands  . Asthma    ?, inhaler use as needed  . Complication of anesthesia 1990   muscle relaxant caused lung to collapse  . Difficult intubation 1990  . Dyslipidemia   . History of chicken pox   . IBS (irritable bowel syndrome)    ?  .[name] KitchenMigraine     Past Surgical History:  Procedure Laterality Date  . ABDOMINAL HYSTERECTOMY  05/2012  . ABDOMINOPLASTY  05/04/2012   Procedure: ABDOMINOPLASTY;  Surgeon: DCrissie Reese MD;  [facility];  Service: Plastics;  Laterality: N/A;  . ANAL FISSURE REPAIR     age 50 . EYE SURGERY Left    age 50 . GYNECOLOGIC CRYOSURGERY    . WISDOM TOOTH EXTRACTION      MEDS:   Current Outpatient Medications on File Prior to Visit  Medication Sig Dispense Refill  . amphetamine-dextroamphetamine (ADDERALL) 10 MG tablet Take 1 tablet (10 mg total) by  mouth 2 (two) times daily. 60 tablet 0  . budesonide-formoterol (SYMBICORT) 80-4.5 MCG/ACT inhaler INHALE 2 PUFFS BY MOUTH INTO THE LUNGS 2 TIMES DAILY. 10.2 g 5  . cyanocobalamin (,VITAMIN B-12,) 1000 MCG/ML injection Take B12 1,000 mcg IM once every 2 weeks for 2 months, then once monthly. 10 mL 2  . estradiol (ESTRACE) 0.5 MG tablet Take 1 tablet (0.5 mg total) by mouth daily. 90 tablet 1  . ketotifen (ZADITOR) 0.025 % ophthalmic solution Place 1 drop into both eyes 2 (two) times daily.    .[name] Kitchenlevalbuterol (XOPENEX HFA) 45 MCG/ACT inhaler Inhale 1-2 puffs into the lungs every 6 (six) hours as needed for wheezing. 15 g 3  . levalbuterol (XOPENEX) 1.25 MG/3ML nebulizer solution USE 1 VIAL IN NEBULIZER EVERY 4 HOURS AS NEEDED FOR WHEEZING. 90 mL 0  . levocetirizine (XYZAL) 5 MG tablet TAKE 1 TABLET BY MOUTH ONCE DAILY AS NEEDED 90 tablet 3  . mometasone (ELOCON) 0.1 % cream APPLY TO THE AFFECTED AREA(S) DAILY AS NEEDED 45 g 11  . mometasone (NASONEX) 50 MCG/ACT nasal spray INSTILL 2 SPRAYS INTO NOSE ONCE DAILY 51 g 0  . NEEDLE, DISP, 25 G (BD ECLIPSE NEEDLE) 25G X 5/8" MISC Use as directed. 10 each 5  . ranitidine (ZANTAC) 150 MG capsule Take 150 mg by mouth 2 (two) times daily.     .[name] Kitchen  Vitamin D, Ergocalciferol, (DRISDOL) 1.25 MG (50000 UNIT) CAPS capsule Take 1 capsule (50,000 Units total) by mouth once a week. NEEDS APPT FOR FURTHER REFILLS 12 capsule 3  . amphetamine-dextroamphetamine (ADDERALL) 10 MG tablet Take 1 tablet by mouth 2 times daily.  May refill in 1 month (Patient not taking: Reported on 08/17/2020) 60 tablet 0  . amphetamine-dextroamphetamine (ADDERALL) 10 MG tablet Take 1 tablet by mouth 2 times daily.  May refill in 2 months. (Patient not taking: Reported on 08/17/2020) 60 tablet 0  . methocarbamol (ROBAXIN) 500 MG tablet Take 1 tablet (500 mg total) by mouth every 6 (six) hours as needed for muscle spasms. (Patient not taking: Reported on 08/17/2020) 60 tablet 0  . methylPREDNISolone  (MEDROL) 4 MG tablet Medrol dose pack. Take as instructed (Patient not taking: Reported on 08/17/2020) 21 tablet 0  . mometasone (ELOCON) 0.1 % cream Apply 1 application topically daily as needed. (Patient not taking: Reported on 08/17/2020) 45 g 11   No current facility-administered medications on file prior to visit.    ALLERGIES: Scopolamine, Vyvanse [lisdexamfetamine dimesylate], Bactrim [sulfamethoxazole-trimethoprim], Biaxin [clarithromycin], Tamiflu [oseltamivir phosphate], Cephalexin, and Asa [aspirin]  Family History  Problem Relation Age of Onset  . Arthritis Mother   . Hyperlipidemia Mother   . Glaucoma Mother   . Irritable bowel syndrome Mother   . Cataracts Mother   . Hyperlipidemia Father   . Heart disease Father 32       CAD  . Cataracts Father   . Thyroid disease Sister   . Hyperlipidemia Brother   . Ulcerative colitis Brother     SH:  Married, non smoker  Review of Systems  Constitutional: Negative.   Gastrointestinal: Negative.   Genitourinary: Positive for genital sores (swelling).    PHYSICAL EXAMINATION:    BP (!) 146/96   Pulse 80   Wt 136 lb (61.7 kg)   LMP 04/14/2012   BMI 26.56 kg/m     General appearance: alert, cooperative and appears stated age Lymph:  no inguinal LAD noted  Pelvic: External genitalia:  About 30m right labia minora selling that is tender with erythema just on the lesion, no surrounding erythema, no fluctuance              Urethra:  normal appearing urethra with no masses, tenderness or lesions              Bartholins and Skenes: normal                 Vagina: normal appearing vagina with normal color and discharge, no lesions               Chaperone, CWendelyn Breslow CMA, was present for exam.  Assessment/Plan: 1. Labial abscess - no fluctuance today so do not feel I&D is appropriate - doxycycline (VIBRAMYCIN) 100 MG capsule; Take 1 capsule (100 mg total) by mouth 2 (two) times daily. Take BID for 14 days.  Take with food  as can cause GI distress.  Dispense: 14 capsule; Refill: 0 - asked pt to give update in a few days and to call/send mychart message if finding worsens

## 2020-08-18 ENCOUNTER — Encounter: Payer: Self-pay | Admitting: Obstetrics & Gynecology

## 2020-09-12 ENCOUNTER — Ambulatory Visit (HOSPITAL_BASED_OUTPATIENT_CLINIC_OR_DEPARTMENT_OTHER): Payer: 59 | Admitting: Obstetrics & Gynecology

## 2020-09-13 ENCOUNTER — Other Ambulatory Visit: Payer: Self-pay | Admitting: Obstetrics & Gynecology

## 2020-09-13 MED FILL — CYANOCOBALAMIN 1,000 MCG/ML: 1000 | 84 days supply | Qty: 5 | Fill #1

## 2020-09-13 MED FILL — AMPHETAMINE SALTS 10 MG: 10 | 30 days supply | Qty: 60 | Fill #0

## 2020-09-13 MED FILL — VIT D2 1.25 MG (50,000 UNIT: 1.25 MG | 84 days supply | Qty: 12 | Fill #2

## 2020-09-20 ENCOUNTER — Other Ambulatory Visit: Payer: Self-pay | Admitting: Obstetrics & Gynecology

## 2020-09-28 ENCOUNTER — Ambulatory Visit (HOSPITAL_BASED_OUTPATIENT_CLINIC_OR_DEPARTMENT_OTHER): Payer: 59 | Admitting: Obstetrics & Gynecology

## 2020-10-09 ENCOUNTER — Other Ambulatory Visit (HOSPITAL_BASED_OUTPATIENT_CLINIC_OR_DEPARTMENT_OTHER): Payer: Self-pay | Admitting: Obstetrics & Gynecology

## 2020-10-09 ENCOUNTER — Ambulatory Visit (INDEPENDENT_AMBULATORY_CARE_PROVIDER_SITE_OTHER): Payer: 59 | Admitting: Obstetrics & Gynecology

## 2020-10-09 ENCOUNTER — Other Ambulatory Visit: Payer: Self-pay

## 2020-10-09 ENCOUNTER — Encounter (HOSPITAL_BASED_OUTPATIENT_CLINIC_OR_DEPARTMENT_OTHER): Payer: Self-pay | Admitting: Obstetrics & Gynecology

## 2020-10-09 VITALS — BP 125/89 | HR 77 | Ht 61.0 in | Wt 134.0 lb

## 2020-10-09 DIAGNOSIS — Z01419 Encounter for gynecological examination (general) (routine) without abnormal findings: Secondary | ICD-10-CM | POA: Diagnosis not present

## 2020-10-09 DIAGNOSIS — Z1211 Encounter for screening for malignant neoplasm of colon: Secondary | ICD-10-CM | POA: Diagnosis not present

## 2020-10-09 DIAGNOSIS — Z79890 Hormone replacement therapy (postmenopausal): Secondary | ICD-10-CM | POA: Diagnosis not present

## 2020-10-09 DIAGNOSIS — E785 Hyperlipidemia, unspecified: Secondary | ICD-10-CM | POA: Diagnosis not present

## 2020-10-09 DIAGNOSIS — N764 Abscess of vulva: Secondary | ICD-10-CM | POA: Diagnosis not present

## 2020-10-09 DIAGNOSIS — Z90710 Acquired absence of both cervix and uterus: Secondary | ICD-10-CM

## 2020-10-09 MED ORDER — ESTRADIOL 0.5 MG PO TABS: 0.5000 mg | ORAL_TABLET | Freq: Every day | ORAL | 4 refills | Status: DC

## 2020-10-09 MED ORDER — DOXYCYCLINE HYCLATE 100 MG PO CAPS: 100.0000 mg | ORAL_CAPSULE | Freq: Two times a day (BID) | ORAL | 0 refills | Status: DC

## 2020-10-09 MED ORDER — FLUCONAZOLE 150 MG PO TABS: 150.0000 mg | ORAL_TABLET | Freq: Once | ORAL | 0 refills | Status: DC

## 2020-10-09 MED FILL — ESTRADIOL 0.5 MG TABS: 0.5 | 90 days supply | Qty: 90 | Fill #0

## 2020-10-09 MED FILL — DOXYCYCLINE HYCLATE 100 MG: 100 | 10 days supply | Qty: 20 | Fill #0

## 2020-10-09 MED FILL — FLUCONAZOLE 150 MG TABS: 150 | 1 days supply | Qty: 1 | Fill #0

## 2020-10-09 NOTE — Progress Notes (Unsigned)
50 y.o. G3P0202 Married [place] female here for annual exam.  Doing well.  Denies vaginal bleeding.  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  D/w pt having rx on hand especially if traveling to help prevent recurrence.    Patient's last menstrual period was 04/14/2012.          Sexually active: Yes.    The current method of family planning is status post hysterectomy.    Exercising:  Smoker:  no  Health Maintenance: Pap:  Not indicated History of abnormal Pap:  no MMG:  11/2019 Colonoscopy:  Willing to do cologuard BMD:   Not indicated TDaP:  04/26/2013 Pneumonia vaccine(s):  n/a Shingrix:   D/w pt.  Planning on getting this when eligible Hep C testing: d/w pt Screening Labs: 11/2019   reports that she has never smoked. She has never used smokeless tobacco. She reports that she does not drink alcohol and does not use drugs.  Past Medical History:  Diagnosis Date  . Adult ADHD   . Allergy   . Arthritis    hands  . Asthma    ?, inhaler use as needed  . Complication of anesthesia 1990   muscle relaxant caused lung to collapse  . Difficult intubation 1990  . Dyslipidemia   . History of chicken pox   . IBS (irritable bowel syndrome)    ?  [name] Kitchen Migraine     Past Surgical History:  Procedure Laterality Date  . ABDOMINAL HYSTERECTOMY  05/2012  . ABDOMINOPLASTY  05/04/2012   Procedure: ABDOMINOPLASTY;  Surgeon: Crissie Reese, MD;  Location: [facility];  Service: Plastics;  Laterality: N/A;  . ANAL FISSURE REPAIR     age 23  . EYE SURGERY Left    age 22  . GYNECOLOGIC CRYOSURGERY    . WISDOM TOOTH EXTRACTION      Current Outpatient Medications  Medication Sig Dispense Refill  . amphetamine-dextroamphetamine (ADDERALL) 10 MG tablet Take 1 tablet (10 mg total) by mouth 2 (two) times daily. 60 tablet 0  . amphetamine-dextroamphetamine (ADDERALL) 10 MG tablet Take 1 tablet by mouth 2 times daily.  May refill in 1 month (Patient taking differently: Take 1 tablet by mouth 2  times daily.  May refill in 1 month) 60 tablet 0  . amphetamine-dextroamphetamine (ADDERALL) 10 MG tablet Take 1 tablet by mouth 2 times daily.  May refill in 2 months. (Patient taking differently: Take 1 tablet by mouth 2 times daily.  May refill in 2 months.) 60 tablet 0  . budesonide-formoterol (SYMBICORT) 80-4.5 MCG/ACT inhaler INHALE 2 PUFFS BY MOUTH INTO THE LUNGS 2 TIMES DAILY. 10.2 g 5  . cyanocobalamin (,VITAMIN B-12,) 1000 MCG/ML injection Take B12 1,000 mcg IM once every 2 weeks for 2 months, then once monthly. 10 mL 2  . estradiol (ESTRACE) 0.5 MG tablet Take 1 tablet (0.5 mg total) by mouth daily. 90 tablet 1  . ketotifen (ZADITOR) 0.025 % ophthalmic solution Place 1 drop into both eyes 2 (two) times daily.    [name] Kitchen levalbuterol (XOPENEX HFA) 45 MCG/ACT inhaler Inhale 1-2 puffs into the lungs every 6 (six) hours as needed for wheezing. 15 g 3  . levalbuterol (XOPENEX) 1.25 MG/3ML nebulizer solution USE 1 VIAL IN NEBULIZER EVERY 4 HOURS AS NEEDED FOR WHEEZING. 90 mL 0  . levocetirizine (XYZAL) 5 MG tablet TAKE 1 TABLET BY MOUTH ONCE DAILY AS NEEDED 90 tablet 3  . mometasone (ELOCON) 0.1 % cream APPLY TO THE AFFECTED AREA(S) DAILY AS NEEDED 45  g 11  . mometasone (ELOCON) 0.1 % cream Apply 1 application topically daily as needed. 45 g 11  . mometasone (NASONEX) 50 MCG/ACT nasal spray INSTILL 2 SPRAYS INTO NOSE ONCE DAILY 51 g 0  . NEEDLE, DISP, 25 G (BD ECLIPSE NEEDLE) 25G X 5/8" MISC Use as directed. 10 each 5  . ranitidine (ZANTAC) 150 MG capsule Take 150 mg by mouth 2 (two) times daily.     . Vitamin D, Ergocalciferol, (DRISDOL) 1.25 MG (50000 UNIT) CAPS capsule Take 1 capsule (50,000 Units total) by mouth once a week. NEEDS APPT FOR FURTHER REFILLS 12 capsule 3   No current facility-administered medications for this visit.    Family History  Problem Relation Age of Onset  . Arthritis Mother   . Hyperlipidemia Mother   . Glaucoma Mother   . Irritable bowel syndrome Mother   .  Cataracts Mother   . Hyperlipidemia Father   . Heart disease Father 81       CAD  . Cataracts Father   . Thyroid disease Sister   . Hyperlipidemia Brother   . Ulcerative colitis Brother     Review of Systems  All other systems reviewed and are negative.   Exam:   BP 125/89   Pulse 77   Ht 5' 1"  (1.549 m)   Wt 134 lb (60.8 kg)   LMP 04/14/2012   SpO2 100%   BMI 25.32 kg/m   Height: 5' 1"  (154.9 cm)  General appearance: alert, cooperative and appears stated age Head: Normocephalic, without obvious abnormality, atraumatic Neck: no adenopathy, supple, symmetrical, trachea midline and thyroid normal to inspection and palpation Lungs: clear to auscultation bilaterally Breasts: normal appearance, no masses or tenderness Heart: regular rate and rhythm Abdomen: soft, non-tender; bowel sounds normal; no masses,  no organomegaly Extremities: extremities normal, atraumatic, no cyanosis or edema Skin: Skin color, texture, turgor normal. No rashes or lesions Lymph nodes: Cervical, supraclavicular, and axillary nodes normal. No abnormal inguinal nodes palpated Neurologic: Grossly normal   Pelvic: External genitalia:  no lesions              Urethra:  normal appearing urethra with no masses, tenderness or lesions              Bartholins and Skenes: normal                 Vagina: normal appearing vagina with normal color and discharge, no lesions              Cervix: absent              Pap taken: No. Bimanual Exam:  Uterus:  uterus absent              Adnexa: no mass, fullness, tenderness               Rectovaginal: Confirms               Anus:  normal sphincter tone, no lesions  Chaperone, Britt Bottom, CMA, was present for exam.  Assessment/Plan: 1. Well woman exam with routine gynecological exam - pap smear not indicated - MMG 11/2019 - Colon cancer screening discussed.  Would prefer cologuard this year. - lab work done with Dr. Elease Hashimoto 11/22/2019 - BMD not indicated -  vaccines up to date - will plan HIV and hep C the next time I draw blood or can do with Dr. Elease Hashimoto this spring   2. Colon cancer screening - Cologuard order placed  3. Labial abscess - d/w pt having rx on hand.  Needs diflucan as well with antibiotic - fluconazole (DIFLUCAN) 150 MG tablet; Take 1 tablet (150 mg total) by mouth once for 1 dose.  Dispense: 1 tablet; Refill: 0 - doxycycline (VIBRAMYCIN) 100 MG capsule; Take 1 capsule (100 mg total) by mouth 2 (two) times daily.  Dispense: 20 capsule; Refill: 0  4. Elevated lipids - has done cardiac CT in 2014.  Will have repeat this year.  5. History of total abdominal hysterectomy  6. Hormone replacement therapy - estradiol (ESTRACE) 0.5 MG tablet; Take 1 tablet (0.5 mg total) by mouth daily.  Dispense: 90 tablet; Refill: 4

## 2020-10-10 DIAGNOSIS — Z79890 Hormone replacement therapy (postmenopausal): Secondary | ICD-10-CM | POA: Insufficient documentation

## 2020-10-10 DIAGNOSIS — Z90710 Acquired absence of both cervix and uterus: Secondary | ICD-10-CM | POA: Insufficient documentation

## 2020-10-10 DIAGNOSIS — N764 Abscess of vulva: Secondary | ICD-10-CM | POA: Insufficient documentation

## 2020-10-10 MED ORDER — DOXYCYCLINE HYCLATE 100 MG PO CAPS: 100.0000 mg | ORAL_CAPSULE | Freq: Two times a day (BID) | ORAL | 0 refills | Status: DC

## 2020-10-23 DIAGNOSIS — Z1211 Encounter for screening for malignant neoplasm of colon: Secondary | ICD-10-CM | POA: Diagnosis not present

## 2020-10-31 DIAGNOSIS — Z03818 Encounter for observation for suspected exposure to other biological agents ruled out: Secondary | ICD-10-CM | POA: Diagnosis not present

## 2020-10-31 DIAGNOSIS — Z20822 Contact with and (suspected) exposure to covid-19: Secondary | ICD-10-CM | POA: Diagnosis not present

## 2020-11-08 ENCOUNTER — Encounter (HOSPITAL_BASED_OUTPATIENT_CLINIC_OR_DEPARTMENT_OTHER): Payer: Self-pay

## 2020-11-08 ENCOUNTER — Encounter (HOSPITAL_BASED_OUTPATIENT_CLINIC_OR_DEPARTMENT_OTHER): Payer: Self-pay | Admitting: Obstetrics & Gynecology

## 2020-11-08 LAB — COLOGUARD

## 2020-12-10 ENCOUNTER — Encounter: Payer: Self-pay | Admitting: Family Medicine

## 2020-12-10 ENCOUNTER — Other Ambulatory Visit (HOSPITAL_COMMUNITY): Payer: Self-pay

## 2020-12-10 MED ORDER — AMPHETAMINE-DEXTROAMPHETAMINE 10 MG PO TABS
ORAL_TABLET | Freq: Two times a day (BID) | ORAL | 0 refills | Status: DC
  Filled 2020-12-10: qty 60, 30d supply, fill #0

## 2020-12-10 NOTE — Telephone Encounter (Signed)
I have refilled her Adderall once but not seen in over a year and needs office follow-up

## 2020-12-19 ENCOUNTER — Other Ambulatory Visit: Payer: Self-pay | Admitting: Family Medicine

## 2020-12-19 DIAGNOSIS — Z1231 Encounter for screening mammogram for malignant neoplasm of breast: Secondary | ICD-10-CM

## 2021-01-08 ENCOUNTER — Other Ambulatory Visit: Payer: Self-pay | Admitting: Family Medicine

## 2021-01-08 DIAGNOSIS — Z789 Other specified health status: Secondary | ICD-10-CM

## 2021-01-08 DIAGNOSIS — Z0000 Encounter for general adult medical examination without abnormal findings: Secondary | ICD-10-CM

## 2021-01-09 ENCOUNTER — Other Ambulatory Visit (INDEPENDENT_AMBULATORY_CARE_PROVIDER_SITE_OTHER): Payer: 59

## 2021-01-09 ENCOUNTER — Other Ambulatory Visit: Payer: Self-pay

## 2021-01-09 DIAGNOSIS — Z789 Other specified health status: Secondary | ICD-10-CM

## 2021-01-09 DIAGNOSIS — Z0000 Encounter for general adult medical examination without abnormal findings: Secondary | ICD-10-CM | POA: Diagnosis not present

## 2021-01-09 LAB — HEPATIC FUNCTION PANEL
ALT: 15 U/L (ref 0–35)
AST: 15 U/L (ref 0–37)
Albumin: 4.4 g/dL (ref 3.5–5.2)
Alkaline Phosphatase: 52 U/L (ref 39–117)
Bilirubin, Direct: 0.1 mg/dL (ref 0.0–0.3)
Total Bilirubin: 1 mg/dL (ref 0.2–1.2)
Total Protein: 7 g/dL (ref 6.0–8.3)

## 2021-01-09 LAB — BASIC METABOLIC PANEL
BUN: 12 mg/dL (ref 6–23)
CO2: 30 mEq/L (ref 19–32)
Calcium: 9.1 mg/dL (ref 8.4–10.5)
Chloride: 101 mEq/L (ref 96–112)
Creatinine, Ser: 0.79 mg/dL (ref 0.40–1.20)
GFR: 88.09 mL/min (ref >60.00)
Glucose, Bld: 102 mg/dL — ABNORMAL HIGH (ref 70–99)
Potassium: 5 mEq/L (ref 3.5–5.1)
Sodium: 136 mEq/L (ref 135–145)

## 2021-01-09 LAB — CBC WITH DIFFERENTIAL/PLATELET
Basophils Absolute: 0.1 10*3/uL (ref 0.0–0.1)
Basophils Relative: 1.6 % (ref 0.0–3.0)
Eosinophils Absolute: 0.1 10*3/uL (ref 0.0–0.7)
Eosinophils Relative: 2.1 % (ref 0.0–5.0)
HCT: 38 % (ref 36.0–46.0)
Hemoglobin: 12.3 g/dL (ref 12.0–15.0)
Lymphocytes Relative: 44.5 % (ref 12.0–46.0)
Lymphs Abs: 2.4 10*3/uL (ref 0.7–4.0)
MCHC: 32.5 g/dL (ref 30.0–36.0)
MCV: 86.5 fl (ref 78.0–100.0)
Monocytes Absolute: 0.4 10*3/uL (ref 0.1–1.0)
Monocytes Relative: 7.8 % (ref 3.0–12.0)
Neutro Abs: 2.3 10*3/uL (ref 1.4–7.7)
Neutrophils Relative %: 44 % (ref 43.0–77.0)
Platelets: 325 10*3/uL (ref 150.0–400.0)
RBC: 4.39 Mil/uL (ref 3.87–5.11)
RDW: 14.5 % (ref 11.5–15.5)
WBC: 5.3 10*3/uL (ref 4.0–10.5)

## 2021-01-09 LAB — VITAMIN D 25 HYDROXY (VIT D DEFICIENCY, FRACTURES): VITD: 40.76 ng/mL (ref 30.00–100.00)

## 2021-01-09 LAB — LIPID PANEL
Cholesterol: 298 mg/dL — ABNORMAL HIGH (ref 0–200)
HDL: 70.4 mg/dL (ref >39.00)
LDL Cholesterol: 208 mg/dL — ABNORMAL HIGH (ref 0–99)
NonHDL: 227.69
Total CHOL/HDL Ratio: 4
Triglycerides: 99 mg/dL (ref 0.0–149.0)
VLDL: 19.8 mg/dL (ref 0.0–40.0)

## 2021-01-09 LAB — TSH: TSH: 3.1 u[IU]/mL (ref 0.35–4.50)

## 2021-01-09 LAB — HEMOGLOBIN A1C: Hgb A1c MFr Bld: 6.4 % (ref 4.6–6.5)

## 2021-01-09 LAB — VITAMIN B12: Vitamin B-12: 382 pg/mL (ref 211–911)

## 2021-01-11 ENCOUNTER — Encounter: Payer: Self-pay | Admitting: Family Medicine

## 2021-01-11 ENCOUNTER — Other Ambulatory Visit: Payer: Self-pay

## 2021-01-11 ENCOUNTER — Ambulatory Visit (INDEPENDENT_AMBULATORY_CARE_PROVIDER_SITE_OTHER): Payer: 59 | Admitting: Family Medicine

## 2021-01-11 VITALS — BP 124/70 | HR 92 | Temp 97.7°F | Ht 61.0 in | Wt 138.5 lb

## 2021-01-11 DIAGNOSIS — E538 Deficiency of other specified B group vitamins: Secondary | ICD-10-CM

## 2021-01-11 DIAGNOSIS — Z0000 Encounter for general adult medical examination without abnormal findings: Secondary | ICD-10-CM

## 2021-01-11 DIAGNOSIS — Z789 Other specified health status: Secondary | ICD-10-CM

## 2021-01-11 DIAGNOSIS — E559 Vitamin D deficiency, unspecified: Secondary | ICD-10-CM

## 2021-01-11 LAB — ZINC: Zinc: 76 ug/dL (ref 60–130)

## 2021-01-11 NOTE — Progress Notes (Signed)
Established Patient Office Visit  Subjective:  Patient ID: Marissa Horn, female    DOB: 03/31/1971  Age: 50 y.o. MRN: [id number]  CC:  Chief Complaint  Patient presents with   Annual Exam    No new concerns    HPI Marissa Horn presents for physical exam.  She sees gynecologist yearly for Pap smears and mammograms.  She has done a good job with weight loss in recent years but has gained back about 7 pounds recently.  Increased stress issues.  Husband ,who is a pediatrician, was just diagnosed with CAD and scheduled for bypass next week.  She has a son who just finished high school and will starting college at [employer] next year.  She is a vegetarian and requested several labs including B12, vitamin D, zinc levels.  History of very high cholesterol levels but low 10-year calculated CAD risk.  However, father had CAD at age 58.  Health maintenance reviewed.  She had Cologuard earlier this spring which was negative.  Other health maintenance up-to-date  Family history-both parents have had fairly severe hyperlipidemia.  Father with history of CAD age 47.  No family history of cancer.  She has a brother with hyperlipidemia.  Social history-she is married.  Husband is a pediatrician.  Never smoked.  No regular alcohol.  The 10-year ASCVD risk score Marissa Horn DC Marissa Horn., et al., 2013) is: 1.4%   Values used to calculate the score:     Age: 15 years     Sex: Female     Is Non-Hispanic African American: No     Diabetic: No     Tobacco smoker: No     Systolic Blood Pressure: 299 mmHg     Is BP treated: No     HDL Cholesterol: 70.4 mg/dL     Total Cholesterol: 298 mg/dL   Past Medical History:  Diagnosis Date   Adult ADHD    Allergy    Arthritis    hands   Asthma    ?, inhaler use as needed   Complication of anesthesia 1990   muscle relaxant caused lung to collapse   Difficult intubation 1990   Dyslipidemia    History of chicken pox    IBS (irritable bowel  syndrome)    ?   Migraine     Past Surgical History:  Procedure Laterality Date   ABDOMINAL HYSTERECTOMY  05/2012   ABDOMINOPLASTY  05/04/2012   Procedure: ABDOMINOPLASTY;  Surgeon: Crissie Reese, MD;  Location: [facility];  Service: Plastics;  Laterality: N/A;   ANAL FISSURE REPAIR     age 28   EYE SURGERY Left    age 20   GYNECOLOGIC 5     WISDOM TOOTH EXTRACTION      Family History  Problem Relation Age of Onset   Arthritis Mother    Hyperlipidemia Mother    Glaucoma Mother    Irritable bowel syndrome Mother    Cataracts Mother    Hyperlipidemia Father    Heart disease Father 26       CAD   Cataracts Father    Thyroid disease Sister    Hyperlipidemia Brother    Ulcerative colitis Brother     Social History   Socioeconomic History   Marital status: Married    Spouse name: Not on file   Number of children: 2   Years of education: Not on file   Highest education level: Not on file  Occupational History  Occupation: clinical dietitian  Tobacco Use   Smoking status: Never   Smokeless tobacco: Never  Vaping Use   Vaping Use: Never used  Substance and Sexual Activity   Alcohol use: No   Drug use: No   Sexual activity: Yes    Partners: Male    Birth control/protection: Surgical    Comment: hysterectomy  Other Topics Concern   Not on file  Social History Narrative   Married, 2 sons. Husband is a pediatrician.      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 Approximately one maximum caffeinated beverages daily.   Social Determinants of Health   Financial Resource Strain: Not on file  Food Insecurity: Not on file  Transportation Needs: Not on file  Physical Activity: Not on file  Stress: Not on file  Social Connections: Not on file  Intimate Partner Violence: Not on file    Outpatient Medications Prior to Visit  Medication Sig Dispense Refill   amphetamine-dextroamphetamine (ADDERALL) 10 MG tablet TAKE 1 TABLET BY MOUTH 2 TIMES DAILY. 60  tablet 0   budesonide-formoterol (SYMBICORT) 80-4.5 MCG/ACT inhaler INHALE 2 PUFFS BY MOUTH INTO THE LUNGS 2 TIMES DAILY. 10.2 g 5   estradiol (ESTRACE) 0.5 MG tablet TAKE 1 TABLET BY MOUTH ONCE A DAY 90 tablet 4   fluconazole (DIFLUCAN) 150 MG tablet TAKE 1 TABLET BY MOUTH ONCE A DAY FOR 1 DOSE 1 tablet 0   ketotifen (ZADITOR) 0.025 % ophthalmic solution Place 1 drop into both eyes 2 (two) times daily.     levalbuterol (XOPENEX) 1.25 MG/3ML nebulizer solution USE 1 VIAL IN NEBULIZER EVERY 4 HOURS AS NEEDED FOR WHEEZING. 90 mL 0   levocetirizine (XYZAL) 5 MG tablet TAKE 1 TABLET BY MOUTH ONCE DAILY AS NEEDED 90 tablet 3   mometasone (ELOCON) 0.1 % cream APPLY TO THE AFFECTED AREA(S) DAILY AS NEEDED 45 g 11   mometasone (NASONEX) 50 MCG/ACT nasal spray INSTILL 2 SPRAYS INTO NOSE ONCE DAILY 51 g 0   NEEDLE, DISP, 25 G (BD ECLIPSE NEEDLE) 25G X 5/8" MISC Use as directed. 10 each 5   ranitidine (ZANTAC) 150 MG capsule Take 150 mg by mouth 2 (two) times daily.      levalbuterol (XOPENEX HFA) 45 MCG/ACT inhaler INHALE 1 - 2 PUFFS INTO THE LUNGS EVERY 6 HOURS AS NEEDED FOR WHEEZING 15 g 3   amphetamine-dextroamphetamine (ADDERALL) 10 MG tablet Take 1 tablet by mouth 2 times daily.  May refill in 1 month (Patient taking differently: Take 1 tablet by mouth 2 times daily.  May refill in 1 month) 60 tablet 0   amphetamine-dextroamphetamine (ADDERALL) 10 MG tablet Take 1 tablet by mouth 2 times daily.  May refill in 2 months. (Patient taking differently: Take 1 tablet by mouth 2 times daily.  May refill in 2 months.) 60 tablet 0   doxycycline (VIBRAMYCIN) 100 MG capsule Take 1 capsule (100 mg total) by mouth 2 (two) times daily. 20 capsule 0   mometasone (ELOCON) 0.1 % cream APPLY TO THE AFFECTED AREA(S) ONCE A DAY AS NEEDED 45 g 11   No facility-administered medications prior to visit.    Allergies  Allergen Reactions   Scopolamine Anaphylaxis   Vyvanse [Lisdexamfetamine Dimesylate]     Severe migraine    Bactrim [Sulfamethoxazole-Trimethoprim]     Joint pain, fever   Biaxin [Clarithromycin] Nausea And Vomiting    Projectile vomiting   Tamiflu [Oseltamivir Phosphate] Nausea And Vomiting    Projectile vomiting  Cephalexin Nausea And Vomiting   Asa [Aspirin] Rash    Pt takes ibuprofen without problems    ROS Review of Systems  Constitutional:  Negative for activity change, appetite change, chills, fatigue, fever and unexpected weight change.  HENT:  Negative for ear pain, hearing loss, sore throat and trouble swallowing.   Eyes:  Negative for visual disturbance.  Respiratory:  Negative for cough, chest tightness, shortness of breath and wheezing.   Cardiovascular:  Negative for chest pain, palpitations and leg swelling.  Gastrointestinal:  Negative for abdominal pain, blood in stool, constipation and diarrhea.  Endocrine: Negative for polydipsia and polyuria.  Genitourinary:  Negative for dysuria and hematuria.  Musculoskeletal:  Negative for arthralgias, back pain and myalgias.  Skin:  Negative for rash.  Neurological:  Negative for dizziness, seizures, syncope, weakness, light-headedness and headaches.  Hematological:  Negative for adenopathy.  Psychiatric/Behavioral:  Negative for confusion and dysphoric mood.      Objective:    Physical Exam Constitutional:      Appearance: She is well-developed.  HENT:     Head: Normocephalic and atraumatic.  Eyes:     Pupils: Pupils are equal, round, and reactive to light.  Neck:     Thyroid: No thyromegaly.  Cardiovascular:     Rate and Rhythm: Normal rate and regular rhythm.     Heart sounds: Normal heart sounds. No murmur heard. Pulmonary:     Effort: No respiratory distress.     Breath sounds: Normal breath sounds. No wheezing or rales.  Abdominal:     General: Bowel sounds are normal. There is no distension.     Palpations: Abdomen is soft. There is no mass.     Tenderness: There is no abdominal tenderness. There is no  guarding or rebound.  Musculoskeletal:        General: Normal range of motion.     Cervical back: Normal range of motion and neck supple.  Lymphadenopathy:     Cervical: No cervical adenopathy.  Skin:    Findings: No rash.  Neurological:     Mental Status: She is alert and oriented to person, place, and time.     Cranial Nerves: No cranial nerve deficit.     Deep Tendon Reflexes: Reflexes normal.  Psychiatric:        Behavior: Behavior normal.        Thought Content: Thought content normal.        Judgment: Judgment normal.    BP 124/70 (BP Location: Left Arm, Patient Position: Sitting, Cuff Size: Normal)   Pulse 92   Temp 97.7 F (36.5 C) (Oral)   Ht 5' 1"  (1.549 m)   Wt 138 lb 8 oz (62.8 kg)   LMP 04/14/2012   SpO2 97%   BMI 26.17 kg/m  Wt Readings from Last 3 Encounters:  01/11/21 138 lb 8 oz (62.8 kg)  10/09/20 134 lb (60.8 kg)  08/17/20 136 lb (61.7 kg)     Health Maintenance Due  Topic Date Due   HIV Screening  Never done   Hepatitis C Screening  Never done    There are no preventive care reminders to display for this patient.  Lab Results  Component Value Date   TSH 3.10 01/09/2021   Lab Results  Component Value Date   WBC 5.3 01/09/2021   HGB 12.3 01/09/2021   HCT 38.0 01/09/2021   MCV 86.5 01/09/2021   PLT 325.0 01/09/2021   Lab Results  Component Value Date   NA  136 01/09/2021   K 5.0 01/09/2021   CO2 30 01/09/2021   GLUCOSE 102 (H) 01/09/2021   BUN 12 01/09/2021   CREATININE 0.79 01/09/2021   BILITOT 1.0 01/09/2021   ALKPHOS 52 01/09/2021   AST 15 01/09/2021   ALT 15 01/09/2021   PROT 7.0 01/09/2021   ALBUMIN 4.4 01/09/2021   CALCIUM 9.1 01/09/2021   GFR 88.09 01/09/2021   Lab Results  Component Value Date   CHOL 298 (H) 01/09/2021   Lab Results  Component Value Date   HDL 70.40 01/09/2021   Lab Results  Component Value Date   LDLCALC 208 (H) 01/09/2021   Lab Results  Component Value Date   TRIG 99.0 01/09/2021   Lab  Results  Component Value Date   CHOLHDL 4 01/09/2021   Lab Results  Component Value Date   HGBA1C 6.4 01/09/2021      Assessment & Plan:   Physical exam.  Generally healthy 50 year old female.  Positive family history of premature CAD.  Patient sees gynecologist yearly for Pap smears and mammograms.  Recent Cologuard negative.  -Recent labs reviewed.  Labs stable.  A1c stable at 6.4%.  Very high lipids.  Calculated 10-year risk for CAD only 1.4% but she does have positive family history of premature CAD.  -Given her very high lipids and family history of premature CAD we discussed coronary calcium score to further risk stratify and she would like to proceed that direction.  If this comes back over 546 would certainly recommend more aggressive treatment of her lipids with statin  -Continue with annual GYN care  -Recent labs reviewed with no major acute concerns  -Consider hepatitis C antibody with next labs  No orders of the defined types were placed in this encounter.   Follow-up: No follow-ups on file.    Carolann Littler, MD

## 2021-01-14 MED ORDER — VITAMIN D (ERGOCALCIFEROL) 1.25 MG (50000 UNIT) PO CAPS: ORAL_CAPSULE | ORAL | 3 refills | Status: DC

## 2021-01-14 MED ORDER — CYANOCOBALAMIN 1000 MCG/ML IJ SOLN: INTRAMUSCULAR | 2 refills | Status: DC

## 2021-01-19 ENCOUNTER — Other Ambulatory Visit: Payer: Self-pay | Admitting: Family Medicine

## 2021-01-19 ENCOUNTER — Other Ambulatory Visit (HOSPITAL_COMMUNITY): Payer: Self-pay

## 2021-01-21 MED ORDER — AMPHETAMINE-DEXTROAMPHETAMINE 10 MG PO TABS
ORAL_TABLET | ORAL | 0 refills | Status: DC
Start: 2021-01-21 — End: 2021-08-07
  Filled 2021-01-21: qty 60, fill #0
  Filled 2021-07-18: qty 60, 30d supply, fill #0

## 2021-01-21 MED ORDER — AMPHETAMINE-DEXTROAMPHETAMINE 10 MG PO TABS
ORAL_TABLET | ORAL | 0 refills | Status: DC
  Filled 2021-01-21: qty 60, fill #0
  Filled 2021-05-07: qty 60, 30d supply, fill #0

## 2021-01-21 MED ORDER — AMPHETAMINE-DEXTROAMPHETAMINE 10 MG PO TABS
ORAL_TABLET | Freq: Two times a day (BID) | ORAL | 0 refills | Status: DC
  Filled 2021-01-21: qty 60, 30d supply, fill #0

## 2021-01-21 NOTE — Telephone Encounter (Signed)
Last filled 12/10/2020 Last OV 01/11/2021  Ok to fill?

## 2021-01-22 ENCOUNTER — Other Ambulatory Visit (HOSPITAL_COMMUNITY): Payer: Self-pay

## 2021-01-24 ENCOUNTER — Other Ambulatory Visit (HOSPITAL_COMMUNITY): Payer: Self-pay

## 2021-01-24 MED FILL — Estradiol Tab 0.5 MG: ORAL | 90 days supply | Qty: 90 | Fill #0 | Status: AC

## 2021-01-25 ENCOUNTER — Other Ambulatory Visit (HOSPITAL_COMMUNITY): Payer: Self-pay

## 2021-01-25 ENCOUNTER — Other Ambulatory Visit: Payer: Self-pay | Admitting: Family Medicine

## 2021-01-25 DIAGNOSIS — E559 Vitamin D deficiency, unspecified: Secondary | ICD-10-CM

## 2021-01-25 DIAGNOSIS — E538 Deficiency of other specified B group vitamins: Secondary | ICD-10-CM

## 2021-01-25 DIAGNOSIS — M5030 Other cervical disc degeneration, unspecified cervical region: Secondary | ICD-10-CM | POA: Diagnosis not present

## 2021-01-25 DIAGNOSIS — Z789 Other specified health status: Secondary | ICD-10-CM

## 2021-01-25 MED ORDER — PREDNISONE 10 MG (21) PO TBPK
ORAL_TABLET | ORAL | 0 refills | Status: DC
  Filled 2021-01-25: qty 21, 6d supply, fill #0

## 2021-01-25 MED ORDER — TIZANIDINE HCL 2 MG PO TABS
ORAL_TABLET | ORAL | 0 refills | Status: DC
  Filled 2021-01-25: qty 30, 30d supply, fill #0

## 2021-01-28 ENCOUNTER — Ambulatory Visit: Payer: 59 | Admitting: Physician Assistant

## 2021-02-01 DIAGNOSIS — R519 Headache, unspecified: Secondary | ICD-10-CM | POA: Diagnosis not present

## 2021-02-01 DIAGNOSIS — M542 Cervicalgia: Secondary | ICD-10-CM | POA: Diagnosis not present

## 2021-02-06 ENCOUNTER — Other Ambulatory Visit (HOSPITAL_COMMUNITY): Payer: Self-pay

## 2021-02-06 DIAGNOSIS — M5481 Occipital neuralgia: Secondary | ICD-10-CM | POA: Diagnosis not present

## 2021-02-06 DIAGNOSIS — M542 Cervicalgia: Secondary | ICD-10-CM | POA: Diagnosis not present

## 2021-02-06 MED ORDER — METHOCARBAMOL 500 MG PO TABS
ORAL_TABLET | ORAL | 0 refills | Status: DC
  Filled 2021-02-06: qty 60, 20d supply, fill #0

## 2021-02-13 ENCOUNTER — Other Ambulatory Visit (HOSPITAL_COMMUNITY): Payer: Self-pay

## 2021-02-13 ENCOUNTER — Ambulatory Visit (HOSPITAL_BASED_OUTPATIENT_CLINIC_OR_DEPARTMENT_OTHER): Payer: 59 | Attending: Physical Medicine and Rehabilitation | Admitting: Physical Therapy

## 2021-02-13 ENCOUNTER — Other Ambulatory Visit: Payer: Self-pay

## 2021-02-13 ENCOUNTER — Ambulatory Visit (INDEPENDENT_AMBULATORY_CARE_PROVIDER_SITE_OTHER)
Admission: RE | Admit: 2021-02-13 | Discharge: 2021-02-13 | Disposition: A | Discharge status: Home / Self Care | Payer: Self-pay | Source: Ambulatory Visit | Attending: Family Medicine | Admitting: Family Medicine

## 2021-02-13 DIAGNOSIS — M542 Cervicalgia: Secondary | ICD-10-CM | POA: Diagnosis not present

## 2021-02-13 DIAGNOSIS — R252 Cramp and spasm: Secondary | ICD-10-CM | POA: Insufficient documentation

## 2021-02-13 DIAGNOSIS — R293 Abnormal posture: Secondary | ICD-10-CM | POA: Insufficient documentation

## 2021-02-13 DIAGNOSIS — Z0000 Encounter for general adult medical examination without abnormal findings: Secondary | ICD-10-CM

## 2021-02-13 MED ORDER — VITAMIN D (ERGOCALCIFEROL) 1.25 MG (50000 UNIT) PO CAPS: ORAL_CAPSULE | ORAL | 3 refills | Status: DC

## 2021-02-13 MED ORDER — CYANOCOBALAMIN 1000 MCG/ML IJ SOLN: INTRAMUSCULAR | 2 refills | Status: AC

## 2021-02-13 NOTE — Addendum Note (Signed)
Addended by: Rebecca Eaton on: 02/13/2021 04:45 PM   Modules accepted: Orders

## 2021-02-14 ENCOUNTER — Encounter (HOSPITAL_BASED_OUTPATIENT_CLINIC_OR_DEPARTMENT_OTHER): Payer: Self-pay | Admitting: Physical Therapy

## 2021-02-14 NOTE — Therapy (Signed)
[place], [place], [address] Phone: [phone]   Fax:  [phone]  Physical Therapy Evaluation  Patient Details  Name: Marissa Horn MRN: [id number] Date of Birth: August 11, 1970 Referring Provider (PT): Laroy Apple, MD   Encounter Date: 02/13/2021   PT End of Session - 02/14/21 0726     Visit Number 1    Number of Visits 9    Date for PT Re-Evaluation 03/15/21    Authorization Type [facility]    PT Start Time 1302    PT Stop Time 1343    PT Time Calculation (min) 41 min    Activity Tolerance Patient tolerated treatment well    Behavior During Therapy [facility] for tasks assessed/performed             Past Medical History:  Diagnosis Date   Adult ADHD    Allergy    Arthritis    hands   Asthma    ?, inhaler use as needed   Complication of anesthesia 1990   muscle relaxant caused lung to collapse   Difficult intubation 1990   Dyslipidemia    History of chicken pox    IBS (irritable bowel syndrome)    ?   Migraine     Past Surgical History:  Procedure Laterality Date   ABDOMINAL HYSTERECTOMY  05/2012   ABDOMINOPLASTY  05/04/2012   Procedure: ABDOMINOPLASTY;  Surgeon: Crissie Reese, MD;  Location: [facility];  Service: Plastics;  Laterality: N/A;   ANAL FISSURE REPAIR     age 3   EYE SURGERY Left    age 58   GYNECOLOGIC CRYOSURGERY     WISDOM TOOTH EXTRACTION      There were no vitals filed for this visit.    Subjective Assessment - 02/13/21 1305     Subjective One day had shooting pain out of nowhere, moving from Lt shoulder up to top of head. I can feel it here- poiinting to Lt suboccipital and proximal attachment of upper trap. Lasted at least 2 weeks without UE pain. Now same areas but more mild.    Pertinent History husband recently had bipass surgery    Patient Stated Goals decr pain    Currently in Pain? Yes    Pain Score --   moderate   Pain Location Neck    Pain  Orientation Left;Upper    Pain Descriptors / Indicators Aching;Shooting    Pain Type Acute pain    Pain Radiating Towards Lt occipital-temporal-frontal regions    Pain Onset 1 to 4 weeks ago    Pain Frequency Constant    Aggravating Factors  pressing on it, unsure what brings the HA    Pain Relieving Factors rest                [facility] PT Assessment - 02/14/21 0001       Assessment   Medical Diagnosis Lt > Rt neck pain, shoulder pain , occipital headaches , C5-6 , C6-7 disc bulges    Referring Provider (PT) Laroy Apple, MD    Onset Date/Surgical Date 01/31/21   approx   Hand Dominance Left    Prior Therapy not this year      Precautions   Precautions None      Restrictions   Weight Bearing Restrictions No      Balance Screen   Has the patient fallen in the past 6 months No      Home Environment   Living  Environment Private residence    Living Arrangements Spouse/significant other;Children      Prior Function   Level of Independence Independent      Cognition   Overall Cognitive Status Within Functional Limits for tasks assessed      Sensation   Additional Comments denies UE paresthesias      Posture/Postural Control   Posture Comments forward head, rounded shoulders, incr kyphosis at lower cervical/upper thoracic      Strength   Right Hand Grip (lbs) 35 25 20    Left Hand Grip (lbs) 30 25 20       Palpation   Palpation comment tightness bil upper traps, Lt suboccipitals-concordant pain, limited thoracic mobility                        Objective measurements completed on examination: See above findings.       [facility] PT Treatment/Exercise - 02/14/21 0001       Therapeutic Activites    Therapeutic Activities Other Therapeutic Activities    Other Therapeutic Activities resting postural alignment- motions to avoid      Manual Therapy   Manual Therapy Soft tissue mobilization;Joint mobilization    Manual therapy comments skilled  palpation and monitoring during TPDN    Joint Mobilization bil first rib depression    Soft tissue mobilization bil upper traps, suboccipitals              Trigger Point Dry Needling - 02/14/21 0001     Consent Given? Yes    Education Handout Provided Previously provided    Muscles Treated Head and Neck Upper trapezius;Suboccipitals    Upper Trapezius Response Twitch reponse elicited;Palpable increased muscle length   bilat   Suboccipitals Response Twitch response elicited;Palpable increased muscle length   LT OCS, Rt semispinalis captiis                 PT Education - 02/14/21 0726     Education Details anatomy of condition, POC, HEP., exercise form/rationale    Person(s) Educated Patient    Methods Explanation    Comprehension Verbalized understanding;Need further instruction                 PT Long Term Goals - 02/14/21 0735       PT LONG TERM GOAL #1   Title grip strength within 5 lb in first and 3rd test    Baseline see flowsheet    Time 4    Period Weeks    Status New    Target Date 03/15/21      PT LONG TERM GOAL #2   Title equal cervical rotation bilat    Baseline not measured at eval    Time 4    Period Weeks    Status New    Target Date 03/15/21      PT LONG TERM GOAL #3   Title pt will avoid sleeping prone at least 5/7 nights/week    Baseline getting body pillow    Time 4    Period Weeks    Status New    Target Date 03/15/21      PT LONG TERM GOAL #4   Title resolution of HA    Baseline frequent at eval    Time 4    Period Weeks    Status New    Target Date 03/15/21  Plan - 02/14/21 0727     Clinical Impression Statement pt presetns to PT with complaints of Lt sided neck pain with associated HA. Symptoms consistent with myofascial HA from trigger points in upper traps & suboccipitals but does have extensive findings in MRI that warrant monitoring. At this time she does not complain of UE symptoms  and we discussed watching for different symptoms. Pt reported feeling soreness following TPDN as expected- I am familiar with this pt and have utilized DN in the past. Reported feeling that she had more cervical ROM upon coming upright and we discussed avoiding extension and shoulder elevation. Grip strength decreases significantly with 3x test and will continut to monitor. Pt reports she sleeps on her stomach- encouraged a body pillow to assist in changing this habit. Pt will benefit from skilled PT to address deficits and meet goals.    Personal Factors and Comorbidities Comorbidity 2    Comorbidities h/o migraines, recent stress due to husbands surgery    Examination-Activity Limitations Reach Overhead;Sit;Sleep;Stand;Lift    Examination-Participation Restrictions Cleaning;Community Activity;Driving    Stability/Clinical Decision Making Evolving/Moderate complexity    Clinical Decision Making Moderate    Rehab Potential Good    PT Frequency 2x / week    PT Duration 4 weeks    PT Treatment/Interventions ADLs/Self Care Home Management;Aquatic Therapy;Cryotherapy;Electrical Stimulation;Ultrasound;Traction;Moist Heat;Iontophoresis 56m/ml Dexamethasone;Functional mobility training;Therapeutic activities;Therapeutic exercise;Neuromuscular re-education;Manual techniques;Patient/family education;Passive range of motion;Dry needling;Taping    PT Next Visit Plan DN PRN, consider traction, periscap activation    PT Home Exercise Plan stop prone sleeping, scap + cervical retraction    Consulted and Agree with Plan of Care Patient             Patient will benefit from skilled therapeutic intervention in order to improve the following deficits and impairments:  Decreased range of motion, Increased muscle spasms, Impaired UE functional use, Decreased activity tolerance, Pain, Improper body mechanics, Decreased strength, Postural dysfunction  Visit Diagnosis: Cervicalgia - Plan: PT plan of care  cert/re-cert  Cramp and spasm - Plan: PT plan of care cert/re-cert  Abnormal posture - Plan: PT plan of care cert/re-cert     Problem List Patient Active Problem List   Diagnosis Date Noted   Labial abscess 10/10/2020   History of total abdominal hysterectomy 10/10/2020   Hormone replacement therapy 10/10/2020   Left tennis elbow 05/05/2019   Neck pain 03/01/2019   Adult ADHD 02/17/2018   Cough variant asthma vs VCD 01/01/2017   Vitamin D deficiency 07/25/2015   Vitamin B 12 deficiency 07/25/2015   Perennial allergic rhinitis 09/26/2014   IBS (irritable bowel syndrome) 05/10/2014   Elevated lipids 12/15/2012   Overactive bladder 05/04/2012   Asthma, moderate persistent 01/14/2012   Emmerson Shuffield C. Danel Studzinski PT, DPT 02/14/21 7:40 AM  [place] [address]Phone: [phone]  Fax:  [phone] Name: Marissa KulakowskiMRN: [id number] of Birth: 624-Jan-1972

## 2021-02-15 ENCOUNTER — Other Ambulatory Visit: Payer: Self-pay

## 2021-02-15 ENCOUNTER — Ambulatory Visit
Admission: RE | Admit: 2021-02-15 | Discharge: 2021-02-15 | Disposition: A | Discharge status: Home / Self Care | Payer: 59 | Source: Ambulatory Visit

## 2021-02-15 DIAGNOSIS — Z1231 Encounter for screening mammogram for malignant neoplasm of breast: Secondary | ICD-10-CM | POA: Diagnosis not present

## 2021-02-19 ENCOUNTER — Ambulatory Visit (HOSPITAL_BASED_OUTPATIENT_CLINIC_OR_DEPARTMENT_OTHER): Payer: 59 | Admitting: Physical Therapy

## 2021-02-26 ENCOUNTER — Other Ambulatory Visit: Payer: Self-pay

## 2021-02-26 ENCOUNTER — Encounter (HOSPITAL_BASED_OUTPATIENT_CLINIC_OR_DEPARTMENT_OTHER): Payer: Self-pay | Admitting: Physical Therapy

## 2021-02-26 ENCOUNTER — Ambulatory Visit (HOSPITAL_BASED_OUTPATIENT_CLINIC_OR_DEPARTMENT_OTHER): Payer: 59 | Admitting: Physical Therapy

## 2021-02-26 DIAGNOSIS — R252 Cramp and spasm: Secondary | ICD-10-CM

## 2021-02-26 DIAGNOSIS — R293 Abnormal posture: Secondary | ICD-10-CM | POA: Diagnosis not present

## 2021-02-26 DIAGNOSIS — M542 Cervicalgia: Secondary | ICD-10-CM

## 2021-02-26 NOTE — Therapy (Signed)
[place], [place], [address] Phone: [phone]   Fax:  [phone]  Physical Therapy Treatment  Patient Details  Name: Marissa Horn MRN: [id number] Date of Birth: 1970-09-25 Referring Provider (PT): Laroy Apple, MD   Encounter Date: 02/26/2021   PT End of Session - 02/26/21 1114     Visit Number 2    Number of Visits 9    Date for PT Re-Evaluation 03/15/21    Authorization Type [facility]    PT Start Time 1111    PT Stop Time 1146    PT Time Calculation (min) 35 min    Activity Tolerance Patient tolerated treatment well    Behavior During Therapy [facility] for tasks assessed/performed             Past Medical History:  Diagnosis Date   Adult ADHD    Allergy    Arthritis    hands   Asthma    ?, inhaler use as needed   Complication of anesthesia 1990   muscle relaxant caused lung to collapse   Difficult intubation 1990   Dyslipidemia    History of chicken pox    IBS (irritable bowel syndrome)    ?   Migraine     Past Surgical History:  Procedure Laterality Date   ABDOMINAL HYSTERECTOMY  05/2012   ABDOMINOPLASTY  05/04/2012   Procedure: ABDOMINOPLASTY;  Surgeon: Crissie Reese, MD;  Location: [facility];  Service: Plastics;  Laterality: N/A;   ANAL FISSURE REPAIR     age 2   EYE SURGERY Left    age 22   GYNECOLOGIC CRYOSURGERY     WISDOM TOOTH EXTRACTION      There were no vitals filed for this visit.   Subjective Assessment - 02/26/21 1112     Subjective I was really sore after last time. It did come back but not as bad. Yesterday I had a lack of sleep and felt it again and had a HA.    Patient Stated Goals decr pain    Currently in Pain? Yes    Pain Score 2     Pain Location Neck    Pain Descriptors / Indicators Tightness    Aggravating Factors  sleep loss, stress    Pain Relieving Factors rest                               [facility] Adult PT  Treatment/Exercise - 02/26/21 0001       Exercises   Exercises Neck      Neck Exercises: Seated   Cervical Rotation Limitations with scap retraction    Other Seated Exercise shoulder rolls      Manual Therapy   Manual therapy comments skilled palpation and monitoring during TPDN    Joint Mobilization bil first rib depression, lateral cervical mobs bil    Soft tissue mobilization bil upper traps & suboccipitals      Neck Exercises: Stretches   Upper Trapezius Stretch Right;Left;30 seconds    Levator Stretch Right;Left;30 seconds              Trigger Point Dry Needling - 02/26/21 0001     Upper Trapezius Response Twitch reponse elicited;Palpable increased muscle length   Lt   Suboccipitals Response Twitch response elicited;Palpable increased muscle length   bil  PT Long Term Goals - 02/14/21 0735       PT LONG TERM GOAL #1   Title grip strength within 5 lb in first and 3rd test    Baseline see flowsheet    Time 4    Period Weeks    Status New    Target Date 03/15/21      PT LONG TERM GOAL #2   Title equal cervical rotation bilat    Baseline not measured at eval    Time 4    Period Weeks    Status New    Target Date 03/15/21      PT LONG TERM GOAL #3   Title pt will avoid sleeping prone at least 5/7 nights/week    Baseline getting body pillow    Time 4    Period Weeks    Status New    Target Date 03/15/21      PT LONG TERM GOAL #4   Title resolution of HA    Baseline frequent at eval    Time 4    Period Weeks    Status New    Target Date 03/15/21                   Plan - 02/26/21 1147     Clinical Impression Statement DN to upper trap and suboccipitals again today and added gentle stretches/mobility to perform TID.    PT Treatment/Interventions ADLs/Self Care Home Management;Aquatic Therapy;Cryotherapy;Electrical Stimulation;Ultrasound;Traction;Moist Heat;Iontophoresis 37m/ml Dexamethasone;Functional  mobility training;Therapeutic activities;Therapeutic exercise;Neuromuscular re-education;Manual techniques;Patient/family education;Passive range of motion;Dry needling;Taping    PT Next Visit Plan DN PRN, lower trap activation    PT Home [facility] sleeping    Consulted and Agree with Plan of Care Patient             Patient will benefit from skilled therapeutic intervention in order to improve the following deficits and impairments:  Decreased range of motion, Increased muscle spasms, Impaired UE functional use, Decreased activity tolerance, Pain, Improper body mechanics, Decreased strength, Postural dysfunction  Visit Diagnosis: Cervicalgia  Cramp and spasm  Abnormal posture     Problem List Patient Active Problem List   Diagnosis Date Noted   Labial abscess 10/10/2020   History of total abdominal hysterectomy 10/10/2020   Hormone replacement therapy 10/10/2020   Left tennis elbow 05/05/2019   Neck pain 03/01/2019   Adult ADHD 02/17/2018   Cough variant asthma vs VCD 01/01/2017   Vitamin D deficiency 07/25/2015   Vitamin B 12 deficiency 07/25/2015   Perennial allergic rhinitis 09/26/2014   IBS (irritable bowel syndrome) 05/10/2014   Elevated lipids 12/15/2012   Overactive bladder 05/04/2012   Asthma, moderate persistent 01/14/2012   Othelia Riederer C. Windsor Goeken PT, DPT 02/26/21 1:08 PM   [facility] [address] [address]Phone: [phone]  Fax:  [phone] Name: AErmal BrzozowskiMRN: [id number] of Birth: [birth year]

## 2021-02-28 ENCOUNTER — Encounter (HOSPITAL_BASED_OUTPATIENT_CLINIC_OR_DEPARTMENT_OTHER): Payer: Self-pay | Admitting: Physical Therapy

## 2021-02-28 ENCOUNTER — Other Ambulatory Visit: Payer: Self-pay

## 2021-02-28 ENCOUNTER — Ambulatory Visit (HOSPITAL_BASED_OUTPATIENT_CLINIC_OR_DEPARTMENT_OTHER): Payer: 59 | Admitting: Physical Therapy

## 2021-02-28 ENCOUNTER — Other Ambulatory Visit (HOSPITAL_COMMUNITY): Payer: Self-pay

## 2021-02-28 DIAGNOSIS — R293 Abnormal posture: Secondary | ICD-10-CM

## 2021-02-28 DIAGNOSIS — R252 Cramp and spasm: Secondary | ICD-10-CM

## 2021-02-28 DIAGNOSIS — M542 Cervicalgia: Secondary | ICD-10-CM

## 2021-02-28 NOTE — Therapy (Signed)
Marissa Horn, [place], [address] Phone: [phone]   Fax:  [phone]  Physical Therapy Treatment  Patient Details  Name: Marissa Horn MRN: [id number] Date of Birth: June 19, 1971 Referring Provider (PT): Marissa Apple, MD   Encounter Date: 02/28/2021   PT End of Session - 02/28/21 1453     Visit Number 3    Number of Visits 9    Date for PT Re-Evaluation 03/15/21    Authorization Type [facility]    PT Start Time 0623    PT Stop Time 1527    PT Time Calculation (min) 35 min    Activity Tolerance Patient tolerated treatment well    Behavior During Therapy [facility] for tasks assessed/performed             Past Medical History:  Diagnosis Date   Adult ADHD    Allergy    Arthritis    hands   Asthma    ?, inhaler use as needed   Complication of anesthesia 1990   muscle relaxant caused lung to collapse   Difficult intubation 1990   Dyslipidemia    History of chicken pox    IBS (irritable bowel syndrome)    ?   Migraine     Past Surgical History:  Procedure Laterality Date   ABDOMINAL HYSTERECTOMY  05/2012   ABDOMINOPLASTY  05/04/2012   Procedure: ABDOMINOPLASTY;  Surgeon: Crissie Reese, MD;  Location: [facility];  Service: Plastics;  Laterality: N/Marissa;   ANAL FISSURE REPAIR     age 41   EYE SURGERY Left    age 47   GYNECOLOGIC CRYOSURGERY     WISDOM TOOTH EXTRACTION      There were no vitals filed for this visit.   Subjective Assessment - 02/28/21 1453     Subjective I am still sore, no HA pain but neck and shoulder hurt.    Patient Stated Goals decr pain                               [facility] Adult PT Treatment/Exercise - 02/28/21 0001       Neck Exercises: Standing   Other Standing Exercises rows, extension focus on scapular motion blue tband      Neck Exercises: Prone   Other Prone Exercise retraction prog to retraction+extension                          PT Long Term Goals - 02/14/21 0735       PT LONG TERM GOAL #1   Title grip strength within 5 lb in first and 3rd test    Baseline see flowsheet    Time 4    Period Weeks    Status New    Target Date 03/15/21      PT LONG TERM GOAL #2   Title equal cervical rotation bilat    Baseline not measured at eval    Time 4    Period Weeks    Status New    Target Date 03/15/21      PT LONG TERM GOAL #3   Title pt will avoid sleeping prone at least 5/7 nights/week    Baseline getting body pillow    Time 4    Period Weeks    Status New    Target Date 03/15/21      PT LONG TERM GOAL #4  Title resolution of HA    Baseline frequent at eval    Time 4    Period Weeks    Status New    Target Date 03/15/21                   Plan - 02/28/21 1528     Clinical Impression Statement cavitations through thoracic spine with mobilizations. exercises focused on periscapular engagment with pelvic tilt for upright posture.    PT Treatment/Interventions ADLs/Self Care Home Management;Aquatic Therapy;Cryotherapy;Electrical Stimulation;Ultrasound;Traction;Moist Heat;Iontophoresis 36m/ml Dexamethasone;Functional mobility training;Therapeutic activities;Therapeutic exercise;Neuromuscular re-education;Manual techniques;Patient/family education;Passive range of motion;Dry needling;Taping    PT Next Visit Plan manual PRN- cont periscap strength, core    PT [facility] sleeping    Consulted and Agree with Plan of Care Patient             Patient will benefit from skilled therapeutic intervention in order to improve the following deficits and impairments:  Decreased range of motion, Increased muscle spasms, Impaired UE functional use, Decreased activity tolerance, Pain, Improper body mechanics, Decreased strength, Postural dysfunction  Visit Diagnosis: Cervicalgia  Cramp and spasm  Abnormal posture     Problem List Patient  Active Problem List   Diagnosis Date Noted   Labial abscess 10/10/2020   History of total abdominal hysterectomy 10/10/2020   Hormone replacement therapy 10/10/2020   Left tennis elbow 05/05/2019   Neck pain 03/01/2019   Adult ADHD 02/17/2018   Cough variant asthma vs VCD 01/01/2017   Vitamin D deficiency 07/25/2015   Vitamin B 12 deficiency 07/25/2015   Perennial allergic rhinitis 09/26/2014   IBS (irritable bowel syndrome) 05/10/2014   Elevated lipids 12/15/2012   Overactive bladder 05/04/2012   Asthma, moderate persistent 01/14/2012   Marissa Horn C. Marissa Horn PT, DPT 02/28/21 3:32 PM   [facility] [address] [address]Phone: [phone]  Fax:  [phone] Name: ARome Horn: [id number] of Birth: [birth year]

## 2021-03-04 ENCOUNTER — Other Ambulatory Visit: Payer: Self-pay

## 2021-03-04 ENCOUNTER — Ambulatory Visit (HOSPITAL_BASED_OUTPATIENT_CLINIC_OR_DEPARTMENT_OTHER): Payer: 59 | Attending: Physical Medicine and Rehabilitation | Admitting: Physical Therapy

## 2021-03-04 ENCOUNTER — Encounter (HOSPITAL_BASED_OUTPATIENT_CLINIC_OR_DEPARTMENT_OTHER): Payer: Self-pay | Admitting: Physical Therapy

## 2021-03-04 DIAGNOSIS — M542 Cervicalgia: Secondary | ICD-10-CM | POA: Diagnosis not present

## 2021-03-04 DIAGNOSIS — R252 Cramp and spasm: Secondary | ICD-10-CM

## 2021-03-04 DIAGNOSIS — R293 Abnormal posture: Secondary | ICD-10-CM | POA: Diagnosis not present

## 2021-03-04 NOTE — Therapy (Signed)
[place], [place], [address] Phone: [phone]   Fax:  [phone]  Physical Therapy Treatment  Patient Details  Name: Marissa Horn MRN: [id number] Date of Birth: 1970/12/17 Referring Provider (PT): Laroy Apple, MD   Encounter Date: 03/04/2021   PT End of Session - 03/04/21 1033     Visit Number 4    Number of Visits 9    Date for PT Re-Evaluation 03/15/21    Authorization Type [facility]    PT Start Time 1032    PT Stop Time 1100    PT Time Calculation (min) 28 min    Activity Tolerance Patient tolerated treatment well    Behavior During Therapy [facility] for tasks assessed/performed             Past Medical History:  Diagnosis Date   Adult ADHD    Allergy    Arthritis    hands   Asthma    ?, inhaler use as needed   Complication of anesthesia 1990   muscle relaxant caused lung to collapse   Difficult intubation 1990   Dyslipidemia    History of chicken pox    IBS (irritable bowel syndrome)    ?   Migraine     Past Surgical History:  Procedure Laterality Date   ABDOMINAL HYSTERECTOMY  05/2012   ABDOMINOPLASTY  05/04/2012   Procedure: ABDOMINOPLASTY;  Surgeon: Crissie Reese, MD;  Location: [facility];  Service: Plastics;  Laterality: N/A;   ANAL FISSURE REPAIR     age 6   EYE SURGERY Left    age 61   GYNECOLOGIC CRYOSURGERY     WISDOM TOOTH EXTRACTION      There were no vitals filed for this visit.   Subjective Assessment - 03/04/21 1034     Subjective Feeling a lot better with barely any pain this weekend. No band exercises this weekend but doing ROM exercises throughout the day.    Currently in Pain? No/denies                [facility] PT Assessment - 03/04/21 0001       Posture/Postural Control   Posture Comments stacked posture moved from anterior pelvic tilt                           [facility] Adult PT Treatment/Exercise - 03/04/21 0001        Neck Exercises: Standing   Other Standing Exercises UE: rows, ext, triceps ext, ER      Manual Therapy   Joint Mobilization cervical traction & grade 3 lateral mobs bil                         PT Long Term Goals - 02/14/21 0735       PT LONG TERM GOAL #1   Title grip strength within 5 lb in first and 3rd test    Baseline see flowsheet    Time 4    Period Weeks    Status New    Target Date 03/15/21      PT LONG TERM GOAL #2   Title equal cervical rotation bilat    Baseline not measured at eval    Time 4    Period Weeks    Status New    Target Date 03/15/21      PT LONG TERM GOAL #3   Title pt will  avoid sleeping prone at least 5/7 nights/week    Baseline getting body pillow    Time 4    Period Weeks    Status New    Target Date 03/15/21      PT LONG TERM GOAL #4   Title resolution of HA    Baseline frequent at eval    Time 4    Period Weeks    Status New    Target Date 03/15/21                   Plan - 03/04/21 1258     Clinical Impression Statement pt demo improved posture sitting back into stacked posture rather than anterior pelvic tilt. fewer cues required today for proper scapular motion. will reduce to 1/week.    PT Treatment/Interventions ADLs/Self Care Home Management;Aquatic Therapy;Cryotherapy;Electrical Stimulation;Ultrasound;Traction;Moist Heat;Iontophoresis 59m/ml Dexamethasone;Functional mobility training;Therapeutic activities;Therapeutic exercise;Neuromuscular re-education;Manual techniques;Patient/family education;Passive range of motion;Dry needling;Taping    PT Next Visit Plan manual PRN- cont periscap strength, core    [facility] sleeping    Consulted and Agree with Plan of Care Patient             Patient will benefit from skilled therapeutic intervention in order to improve the following deficits and impairments:  Decreased range of motion, Increased muscle spasms, Impaired UE  functional use, Decreased activity tolerance, Pain, Improper body mechanics, Decreased strength, Postural dysfunction  Visit Diagnosis: Cervicalgia  Cramp and spasm  Abnormal posture     Problem List Patient Active Problem List   Diagnosis Date Noted   Labial abscess 10/10/2020   History of total abdominal hysterectomy 10/10/2020   Hormone replacement therapy 10/10/2020   Left tennis elbow 05/05/2019   Neck pain 03/01/2019   Adult ADHD 02/17/2018   Cough variant asthma vs VCD 01/01/2017   Vitamin D deficiency 07/25/2015   Vitamin B 12 deficiency 07/25/2015   Perennial allergic rhinitis 09/26/2014   IBS (irritable bowel syndrome) 05/10/2014   Elevated lipids 12/15/2012   Overactive bladder 05/04/2012   Asthma, moderate persistent 01/14/2012   Dacota Ruben C. Harm Jou PT, DPT 03/04/21 1:03 PM   [facility] [address] [address]Phone: [phone]  Fax:  [phone] Name: Marissa MurpheyMRN: [id number] of Birth: [birth year]

## 2021-03-06 ENCOUNTER — Encounter (HOSPITAL_BASED_OUTPATIENT_CLINIC_OR_DEPARTMENT_OTHER): Payer: 59 | Admitting: Physical Therapy

## 2021-03-11 ENCOUNTER — Encounter (HOSPITAL_BASED_OUTPATIENT_CLINIC_OR_DEPARTMENT_OTHER): Payer: 59 | Admitting: Physical Therapy

## 2021-03-13 ENCOUNTER — Ambulatory Visit (HOSPITAL_BASED_OUTPATIENT_CLINIC_OR_DEPARTMENT_OTHER): Payer: 59 | Admitting: Physical Therapy

## 2021-03-14 ENCOUNTER — Ambulatory Visit (HOSPITAL_BASED_OUTPATIENT_CLINIC_OR_DEPARTMENT_OTHER): Payer: 59 | Admitting: Physical Therapy

## 2021-03-14 ENCOUNTER — Other Ambulatory Visit: Payer: Self-pay

## 2021-03-14 ENCOUNTER — Encounter (HOSPITAL_BASED_OUTPATIENT_CLINIC_OR_DEPARTMENT_OTHER): Payer: Self-pay | Admitting: Physical Therapy

## 2021-03-14 DIAGNOSIS — R252 Cramp and spasm: Secondary | ICD-10-CM | POA: Diagnosis not present

## 2021-03-14 DIAGNOSIS — R293 Abnormal posture: Secondary | ICD-10-CM

## 2021-03-14 DIAGNOSIS — M542 Cervicalgia: Secondary | ICD-10-CM | POA: Diagnosis not present

## 2021-03-14 NOTE — Therapy (Signed)
[place], [place], [address] Phone: [phone]   Fax:  [phone]  Physical Therapy Treatment/re-certification  Patient Details  Name: Marissa [place] MRN: [id number] Date of Birth: May 30, 1971 Referring Provider (PT): Laroy Apple, MD   Encounter Date: 03/14/2021   PT End of Session - 03/14/21 1111     Visit Number 5    Number of Visits 9    Date for PT Re-Evaluation 03/29/21    Authorization Type [facility]    PT Start Time 1105    PT Stop Time 1138    PT Time Calculation (min) 33 min    Activity Tolerance Patient tolerated treatment well    Behavior During Therapy [facility] for tasks assessed/performed             Past Medical History:  Diagnosis Date   Adult ADHD    Allergy    Arthritis    hands   Asthma    ?, inhaler use as needed   Complication of anesthesia 1990   muscle relaxant caused lung to collapse   Difficult intubation 1990   Dyslipidemia    History of chicken pox    IBS (irritable bowel syndrome)    ?   Migraine     Past Surgical History:  Procedure Laterality Date   ABDOMINAL HYSTERECTOMY  05/2012   ABDOMINOPLASTY  05/04/2012   Procedure: ABDOMINOPLASTY;  Surgeon: Crissie Reese, MD;  Location: [facility];  Service: Plastics;  Laterality: N/A;   ANAL FISSURE REPAIR     age 50   EYE SURGERY Left    age 50   GYNECOLOGIC CRYOSURGERY     WISDOM TOOTH EXTRACTION      There were no vitals filed for this visit.   Subjective Assessment - 03/14/21 1108     Subjective a little sore after packing and moving son into dorms. did not do exercises.    Patient Stated Goals decr pain    Currently in Pain? Yes    Pain Location Neck    Pain Descriptors / Indicators Sore                [facility] PT Assessment - 03/14/21 0001       Assessment   Medical Diagnosis Lt > Rt neck pain, shoulder pain , occipital headaches , C5-6 , C6-7 disc bulges    Referring Provider (PT)  Laroy Apple, MD    Onset Date/Surgical Date 01/31/21                           [facility] Adult PT Treatment/Exercise - 03/14/21 0001       Neck Exercises: Seated   Other Seated Exercise cervical SB & rotation with cues for scap retraction      Manual Therapy   Manual therapy comments skilled palpation and monitoring during TPDN    Joint Mobilization bil first rib depression, gros rib ER bilat    Soft tissue mobilization bil upper traps              Trigger Point Dry Needling - 03/14/21 0001     Upper Trapezius Response Twitch reponse elicited;Palpable increased muscle length   bil                      PT Long Term Goals - 03/14/21 1132       PT LONG TERM GOAL #1   Title grip strength within  5 lb in first and 3rd test    Baseline Rt 37 Lt 35    Status Achieved      PT LONG TERM GOAL #2   Title equal cervical rotation bilat    Baseline Lt 60, Rt 55    Status On-going      PT LONG TERM GOAL #3   Title pt will avoid sleeping prone at least 5/7 nights/week    Baseline side-stomach ish, still working on it    Status On-going      PT LONG TERM GOAL #4   Title resolution of HA    Baseline less intense    Status On-going                   Plan - 03/14/21 1203     Clinical Impression Statement trigger points realeased in upper traps. improved equality of grip strength and postural awarness. able to maintain gains with recent move of son. extended next visit out by 2 weeks as she now feels she has time to really work on HEP    PT Treatment/Interventions ADLs/Self Care Home Management;Aquatic Therapy;Cryotherapy;Electrical Stimulation;Ultrasound;Traction;Moist Heat;Iontophoresis 58m/ml Dexamethasone;Functional mobility training;Therapeutic activities;Therapeutic exercise;Neuromuscular re-education;Manual techniques;Patient/family education;Passive range of motion;Dry needling;Taping    PT Next Visit Plan d/c vs ext    PT Home Exercise  [facility] sleeping    Consulted and Agree with Plan of Care Patient             Patient will benefit from skilled therapeutic intervention in order to improve the following deficits and impairments:  Decreased range of motion, Increased muscle spasms, Impaired UE functional use, Decreased activity tolerance, Pain, Improper body mechanics, Decreased strength, Postural dysfunction  Visit Diagnosis: Cervicalgia - Plan: PT plan of care cert/re-cert  Cramp and spasm - Plan: PT plan of care cert/re-cert  Abnormal posture - Plan: PT plan of care cert/re-cert     Problem List Patient Active Problem List   Diagnosis Date Noted   Labial abscess 10/10/2020   History of total abdominal hysterectomy 10/10/2020   Hormone replacement therapy 10/10/2020   Left tennis elbow 05/05/2019   Neck pain 03/01/2019   Adult ADHD 02/17/2018   Cough variant asthma vs VCD 01/01/2017   Vitamin D deficiency 07/25/2015   Vitamin B 12 deficiency 07/25/2015   Perennial allergic rhinitis 09/26/2014   IBS (irritable bowel syndrome) 05/10/2014   Elevated lipids 12/15/2012   Overactive bladder 05/04/2012   Asthma, moderate persistent 01/14/2012   Andrea Colglazier C. Baylin Gamblin PT, DPT 03/14/21 12:14 PM   [facility] [address] [place] [address]Phone: [phone]  Fax:  [phone] Name: Marissa BogganMRN: [id number] of Birth: 6November 13, 1972

## 2021-03-26 ENCOUNTER — Encounter (HOSPITAL_BASED_OUTPATIENT_CLINIC_OR_DEPARTMENT_OTHER): Payer: Self-pay | Admitting: Physical Therapy

## 2021-03-26 ENCOUNTER — Other Ambulatory Visit: Payer: Self-pay

## 2021-03-26 ENCOUNTER — Ambulatory Visit (HOSPITAL_BASED_OUTPATIENT_CLINIC_OR_DEPARTMENT_OTHER): Payer: 59 | Admitting: Physical Therapy

## 2021-03-26 DIAGNOSIS — M542 Cervicalgia: Secondary | ICD-10-CM | POA: Diagnosis not present

## 2021-03-26 DIAGNOSIS — R252 Cramp and spasm: Secondary | ICD-10-CM | POA: Diagnosis not present

## 2021-03-26 DIAGNOSIS — R293 Abnormal posture: Secondary | ICD-10-CM | POA: Diagnosis not present

## 2021-03-26 NOTE — Therapy (Signed)
[place], [place], [address] Phone: [phone]   Fax:  [phone]  Physical Therapy Treatment/Discharge  Patient Details  Name: Marissa [place] MRN: [id number] Date of Birth: [birth year] Referring Provider (PT): Marissa Apple, MD   Encounter Date: 03/26/2021   PT End of Session - 03/26/21 1111     Visit Number 6    Number of Visits 9    Date for PT Re-Evaluation 03/29/21    Authorization Type [facility]    PT Start Time 1113    PT Stop Time 1132    PT Time Calculation (min) 19 min    Activity Tolerance Patient tolerated treatment well    Behavior During Therapy [facility] for tasks assessed/performed             Past Medical History:  Diagnosis Date   Adult ADHD    Allergy    Arthritis    hands   Asthma    ?, inhaler use as needed   Complication of anesthesia 1990   muscle relaxant caused lung to collapse   Difficult intubation 1990   Dyslipidemia    History of chicken pox    IBS (irritable bowel syndrome)    ?   Migraine     Past Surgical History:  Procedure Laterality Date   ABDOMINAL HYSTERECTOMY  05/2012   ABDOMINOPLASTY  05/04/2012   Procedure: ABDOMINOPLASTY;  Surgeon: Marissa Reese, MD;  Location: [facility];  Service: Plastics;  Laterality: N/A;   ANAL FISSURE REPAIR     age 50   EYE SURGERY Left    age 50   GYNECOLOGIC CRYOSURGERY     WISDOM TOOTH EXTRACTION      There were no vitals filed for this visit.   Subjective Assessment - 03/26/21 1114     Subjective It has been feeling a lot better. HA are gone. Yesterday was the first time I fully slept on my side.    Patient Stated Goals decr pain    Currently in Pain? No/denies                [facility] PT Assessment - 03/26/21 0001       Assessment   Medical Diagnosis Lt > Rt neck pain, shoulder pain , occipital headaches , C5-6 , C6-7 disc bulges    Referring Provider (PT) Marissa Apple, MD    Onset Date/Surgical  Date 01/31/21      Sensation   Additional Comments WFL      Posture/Postural Control   Posture Comments mild forward head with decr thoracic kyphosis evident                           [facility] Adult PT Treatment/Exercise - 03/26/21 0001       Manual Therapy   Joint Mobilization Rt first rib depression, C5-T4 PA mobs    Soft tissue mobilization Rt levator scap                    PT Education - 03/26/21 1134     Education Details goals, progress, posture, importance of continued HEP    Person(s) Educated Patient    Methods Explanation    Comprehension Verbalized understanding                 PT Long Term Goals - 03/26/21 1115       PT LONG TERM GOAL #1   Title grip strength  within 5 lb in first and 3rd test    Status Achieved      PT LONG TERM GOAL #2   Title equal cervical rotation bilat    Baseline Lt 64, Rt 62    Status Achieved      PT LONG TERM GOAL #3   Title pt will avoid sleeping prone at least 5/7 nights/week    Baseline still working on it    Status On-going      PT LONG TERM GOAL #4   Title resolution of HA    Status Achieved                   Plan - 03/26/21 1133     Clinical Impression Statement Pt reports she is feeling much better and is prepared for d/c. Encouraged her to continue regular stretching and exercises to support her posture as well as consider regular massage for soft tissue tightness. Encouraged her to contact me with any further questions/needs.    PT Treatment/Interventions ADLs/Self Care Home Management;Aquatic Therapy;Cryotherapy;Electrical Stimulation;Ultrasound;Traction;Moist Heat;Iontophoresis 84m/ml Dexamethasone;Functional mobility training;Therapeutic activities;Therapeutic exercise;Neuromuscular re-education;Manual techniques;Patient/family education;Passive range of motion;Dry needling;Taping    PT Home Exercise [facility] prone sleeping    Consulted and Agree with Plan of  Care Patient             Patient will benefit from skilled therapeutic intervention in order to improve the following deficits and impairments:  Decreased range of motion, Increased muscle spasms, Impaired UE functional use, Decreased activity tolerance, Pain, Improper body mechanics, Decreased strength, Postural dysfunction  Visit Diagnosis: Cervicalgia  Cramp and spasm  Abnormal posture     Problem List Patient Active Problem List   Diagnosis Date Noted   Labial abscess 10/10/2020   History of total abdominal hysterectomy 10/10/2020   Hormone replacement therapy 10/10/2020   Left tennis elbow 05/05/2019   Neck pain 03/01/2019   Adult ADHD 02/17/2018   Cough variant asthma vs VCD 01/01/2017   Vitamin D deficiency 07/25/2015   Vitamin B 12 deficiency 07/25/2015   Perennial allergic rhinitis 09/26/2014   IBS (irritable bowel syndrome) 05/10/2014   Elevated lipids 12/15/2012   Overactive bladder 05/04/2012   Asthma, moderate persistent 01/14/2012   PHYSICAL THERAPY DISCHARGE SUMMARY  Visits from Start of Care: 6  Current functional level related to goals / functional outcomes: See above   Remaining deficits: See above   Education / Equipment: Anatomy of condition, POC, HEP, exercise form/rationale   Patient agrees to discharge. Patient goals were met. Patient is being discharged due to being pleased with the current functional level.  Riven Mabile C. Zain Bingman PT, DPT 03/26/21 11:36 AM   [facility] [address] [address]Phone: [phone]  Fax:  [phone] Name: Marissa AlaimoMRN: [id number] of Birth: 610-07-1971

## 2021-05-07 ENCOUNTER — Other Ambulatory Visit (HOSPITAL_COMMUNITY): Payer: Self-pay

## 2021-05-07 MED FILL — Estradiol Tab 0.5 MG: ORAL | 90 days supply | Qty: 90 | Fill #1 | Status: AC

## 2021-05-23 ENCOUNTER — Other Ambulatory Visit: Payer: Self-pay | Admitting: Family Medicine

## 2021-05-23 ENCOUNTER — Other Ambulatory Visit (HOSPITAL_COMMUNITY): Payer: Self-pay

## 2021-05-23 DIAGNOSIS — E538 Deficiency of other specified B group vitamins: Secondary | ICD-10-CM

## 2021-05-23 DIAGNOSIS — E559 Vitamin D deficiency, unspecified: Secondary | ICD-10-CM

## 2021-05-23 DIAGNOSIS — Z789 Other specified health status: Secondary | ICD-10-CM

## 2021-05-23 DIAGNOSIS — J4520 Mild intermittent asthma, uncomplicated: Secondary | ICD-10-CM

## 2021-05-23 MED ORDER — CYANOCOBALAMIN 1000 MCG/ML IJ SOLN
INTRAMUSCULAR | 2 refills | Status: AC
  Filled 2021-05-23: qty 6, 90d supply, fill #0
  Filled 2022-01-14: qty 3, 90d supply, fill #1

## 2021-05-23 MED ORDER — LEVALBUTEROL HCL 1.25 MG/3ML IN NEBU
INHALATION_SOLUTION | RESPIRATORY_TRACT | 0 refills | Status: DC
  Filled 2021-05-23: qty 75, 4d supply, fill #0

## 2021-05-23 MED ORDER — VITAMIN D (ERGOCALCIFEROL) 1.25 MG (50000 UNIT) PO CAPS
ORAL_CAPSULE | ORAL | 3 refills | Status: DC
  Filled 2021-05-23: qty 12, 84d supply, fill #0
  Filled 2021-08-19: qty 12, 84d supply, fill #1
  Filled 2021-10-24 – 2021-10-25 (×2): qty 12, 84d supply, fill #2
  Filled 2021-12-20 – 2022-01-27 (×2): qty 12, 84d supply, fill #3

## 2021-05-23 MED ORDER — BUDESONIDE-FORMOTEROL FUMARATE 80-4.5 MCG/ACT IN AERO
INHALATION_SPRAY | RESPIRATORY_TRACT | 5 refills | Status: DC
  Filled 2021-05-23: qty 10.2, 30d supply, fill #0
  Filled 2021-07-18 – 2021-08-19 (×2): qty 10.2, 30d supply, fill #1

## 2021-05-23 MED ORDER — LEVALBUTEROL TARTRATE 45 MCG/ACT IN AERO
INHALATION_SPRAY | RESPIRATORY_TRACT | 3 refills | Status: DC
  Filled 2021-05-23: qty 15, 25d supply, fill #0
  Filled 2021-07-18: qty 15, 25d supply, fill #1
  Filled 2022-03-05: qty 15, 25d supply, fill #2

## 2021-05-23 MED ORDER — MOMETASONE FUROATE 0.1 % EX CREA
TOPICAL_CREAM | CUTANEOUS | 11 refills | Status: DC
  Filled 2021-05-23: qty 45, 30d supply, fill #0
  Filled 2021-07-18: qty 45, 30d supply, fill #1
  Filled 2021-12-20: qty 45, 30d supply, fill #2
  Filled 2022-03-05: qty 45, 30d supply, fill #3

## 2021-05-24 ENCOUNTER — Other Ambulatory Visit (HOSPITAL_COMMUNITY): Payer: Self-pay

## 2021-05-25 ENCOUNTER — Other Ambulatory Visit (HOSPITAL_COMMUNITY): Payer: Self-pay

## 2021-06-21 ENCOUNTER — Other Ambulatory Visit: Payer: Self-pay

## 2021-06-21 ENCOUNTER — Ambulatory Visit (INDEPENDENT_AMBULATORY_CARE_PROVIDER_SITE_OTHER): Payer: 59

## 2021-06-21 DIAGNOSIS — Z23 Encounter for immunization: Secondary | ICD-10-CM

## 2021-07-18 ENCOUNTER — Other Ambulatory Visit (HOSPITAL_COMMUNITY): Payer: Self-pay

## 2021-07-18 ENCOUNTER — Other Ambulatory Visit: Payer: Self-pay | Admitting: Family Medicine

## 2021-07-18 DIAGNOSIS — J4520 Mild intermittent asthma, uncomplicated: Secondary | ICD-10-CM

## 2021-07-18 MED ORDER — MOMETASONE FUROATE 50 MCG/ACT NA SUSP
NASAL | 0 refills | Status: DC
  Filled 2021-07-18: qty 51, 90d supply, fill #0
  Filled 2021-12-20: qty 17, 30d supply, fill #0
  Filled 2022-01-27: qty 51, 90d supply, fill #0
  Filled 2022-03-05: qty 17, 30d supply, fill #0
  Filled 2022-07-13: qty 51, 90d supply, fill #0

## 2021-07-18 MED FILL — Estradiol Tab 0.5 MG: ORAL | 90 days supply | Qty: 90 | Fill #2 | Status: AC

## 2021-07-19 ENCOUNTER — Encounter: Payer: Self-pay | Admitting: Family Medicine

## 2021-08-06 ENCOUNTER — Other Ambulatory Visit: Payer: Self-pay

## 2021-08-06 ENCOUNTER — Ambulatory Visit: Payer: 59 | Admitting: Family Medicine

## 2021-08-06 ENCOUNTER — Encounter: Payer: Self-pay | Admitting: Family Medicine

## 2021-08-06 ENCOUNTER — Ambulatory Visit (INDEPENDENT_AMBULATORY_CARE_PROVIDER_SITE_OTHER): Payer: 59

## 2021-08-06 VITALS — BP 130/84 | HR 103 | Temp 97.9°F | Wt 144.9 lb

## 2021-08-06 DIAGNOSIS — R0600 Dyspnea, unspecified: Secondary | ICD-10-CM

## 2021-08-06 DIAGNOSIS — R5383 Other fatigue: Secondary | ICD-10-CM | POA: Diagnosis not present

## 2021-08-06 DIAGNOSIS — E538 Deficiency of other specified B group vitamins: Secondary | ICD-10-CM | POA: Diagnosis not present

## 2021-08-06 NOTE — Progress Notes (Signed)
Established Patient Office Visit  Subjective:  Patient ID: Marissa [place], female    DOB: 05/31/1971  Age: 51 y.o. MRN: [id number]  CC:  Chief Complaint  Patient presents with   Follow-up    HPI Marissa [place] presents for symptoms of dyspnea and fatigue following recent COVID.  She apparently had positive COVID test back in mid December.  She had difficulties getting in for virtual visit at that time and never went on any antiviral therapy.  He does have history of asthma and apparently did take some steroids at one point along with her usual inhalers.  Does not have any significant cough at this point but does have some dyspnea with minimal activity.  She feels like this is post COVID-related.  Excessive fatigue.  She has had some recent weight gain which she attributes to the steroids.  She has also been eating more carbohydrates and attempt to get some more energy  Her dyspnea has been relatively continuous and not acute.  No pleuritic pain.  No hemoptysis.  Denies any lower extremity swelling or any calf pain.  She states that she feels very "drained ".  She would like to be screened for anemia.  She also is vegetarian and has had B12 deficiency in the past and requesting B12 levels be checked.  Past Medical History:  Diagnosis Date   Adult ADHD    Allergy    Arthritis    hands   Asthma    ?, inhaler use as needed   Complication of anesthesia 1990   muscle relaxant caused lung to collapse   Difficult intubation 1990   Dyslipidemia    History of chicken pox    IBS (irritable bowel syndrome)    ?   Migraine     Past Surgical History:  Procedure Laterality Date   ABDOMINAL HYSTERECTOMY  05/2012   ABDOMINOPLASTY  05/04/2012   Procedure: ABDOMINOPLASTY;  Surgeon: Crissie Reese, MD;  Location: [facility];  Service: Plastics;  Laterality: N/A;   ANAL FISSURE REPAIR     age 38   EYE SURGERY Left    age 48   GYNECOLOGIC 72     WISDOM TOOTH EXTRACTION       Family History  Problem Relation Age of Onset   Arthritis Mother    Hyperlipidemia Mother    Glaucoma Mother    Irritable bowel syndrome Mother    Cataracts Mother    Hyperlipidemia Father    Heart disease Father 33       CAD   Cataracts Father    Thyroid disease Sister    Hyperlipidemia Brother    Ulcerative colitis Brother     Social History   Socioeconomic History   Marital status: Married    Spouse name: Not on file   Number of children: 2   Years of education: Not on file   Highest education level: Not on file  Occupational History   Occupation: clinical dietitian  Tobacco Use   Smoking status: Never   Smokeless tobacco: Never  Vaping Use   Vaping Use: Never used  Substance and Sexual Activity   Alcohol use: No   Drug use: No   Sexual activity: Yes    Partners: Male    Birth control/protection: Surgical    Comment: hysterectomy  Other Topics Concern   Not on file  Social History Narrative   Married, 2 sons. Husband is a pediatrician.      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 Approximately one maximum caffeinated beverages daily.   Social Determinants of Health   Financial Resource Strain: Not on file  Food Insecurity: Not on file  Transportation Needs: Not on file  Physical Activity: Not on file  Stress: Not on file  Social Connections: Not on file  Intimate Partner Violence: Not on file    Outpatient Medications Prior to Visit  Medication Sig Dispense Refill   amphetamine-dextroamphetamine (ADDERALL) 10 MG tablet Take 1 tablet by mouth 2 times a day 60 tablet 0   amphetamine-dextroamphetamine (ADDERALL) 10 MG tablet Take one tablet by mouth two times daily.  May refill in two months. 60 tablet 0   budesonide-formoterol (SYMBICORT) 80-4.5 MCG/ACT inhaler INHALE 2 PUFFS BY MOUTH INTO THE LUNGS 2 TIMES DAILY. 10.2 g 5   cyanocobalamin (,VITAMIN B-12,) 1000 MCG/ML injection INJECT 1 ML INTO THE MUSCLE ONCE EVERY 2 WEEKS FOR 2 MONTHS,  THEN ONCE MONTHLY 10 mL 2   cyanocobalamin (,VITAMIN B-12,) 1000 MCG/ML injection INJECT 1 ML INTO THE MUSCLE ONCE EVERY 2 WEEKS FOR 2 MONTHS, THEN ONCE MONTHLY 10 mL 2   estradiol (ESTRACE) 0.5 MG tablet TAKE 1 TABLET BY MOUTH ONCE A DAY 90 tablet 4   fluconazole (DIFLUCAN) 150 MG tablet TAKE 1 TABLET BY MOUTH ONCE A DAY FOR 1 DOSE 1 tablet 0   ketotifen (ZADITOR) 0.025 % ophthalmic solution Place 1 drop into both eyes 2 (two) times daily.     levalbuterol (XOPENEX HFA) 45 MCG/ACT inhaler INHALE 1 - 2 PUFFS INTO THE LUNGS EVERY 6 HOURS AS NEEDED FOR WHEEZING 15 g 3   levalbuterol (XOPENEX) 1.25 MG/3ML nebulizer solution USE 1 VIAL IN NEBULIZER EVERY 4 HOURS AS NEEDED FOR WHEEZING. 75 mL 0   levocetirizine (XYZAL) 5 MG tablet TAKE 1 TABLET BY MOUTH ONCE DAILY AS NEEDED 90 tablet 3   methocarbamol (ROBAXIN) 500 MG tablet Take 1 tablet by mouth three times a day as needed for pain and spasms. May cause sedation. 60 tablet 0   mometasone (ELOCON) 0.1 % cream APPLY TO THE AFFECTED AREA(S) DAILY AS NEEDED 45 g 11   mometasone (NASONEX) 50 MCG/ACT nasal spray INSTILL 2 SPRAYS INTO NOSE ONCE DAILY AS DIRECTED. Please call [phone] for appt for refills 51 g 0   NEEDLE, DISP, 25 G (BD ECLIPSE NEEDLE) 25G X 5/8" MISC Use as directed. 10 each 5   predniSONE (STERAPRED UNI-PAK 21 TAB) 10 MG (21) TBPK tablet Take 6 tablets by mouth on day one. Decrease by one tablet daily until all tablets are gone (6-5-4-3-2-1) 21 tablet 0   ranitidine (ZANTAC) 150 MG capsule Take 150 mg by mouth 2 (two) times daily.      tiZANidine (ZANAFLEX) 2 MG tablet Take 1 tablet by mouth at bedtime for muscle spasm 30 tablet 0   Vitamin D, Ergocalciferol, (DRISDOL) 1.25 MG (50000 UNIT) CAPS capsule TAKE 1 CAPSULE BY MOUTH ONCE WEEKLY 12 capsule 3   Vitamin D, Ergocalciferol, (DRISDOL) 1.25 MG (50000 UNIT) CAPS capsule TAKE 1 CAPSULE BY MOUTH ONCE WEEKLY 12 capsule 3   amphetamine-dextroamphetamine (ADDERALL) 10 MG tablet TAKE 1  TABLET BY MOUTH 2 TIMES DAILY. 60 tablet 0   No facility-administered medications prior to visit.    Allergies  Allergen Reactions   Scopolamine Anaphylaxis   Vyvanse [Lisdexamfetamine Dimesylate]     Severe migraine   Bactrim [Sulfamethoxazole-Trimethoprim]     Joint pain, fever   Biaxin [Clarithromycin] Nausea And Vomiting    Projectile vomiting  Tamiflu [Oseltamivir Phosphate] Nausea And Vomiting    Projectile vomiting   Cephalexin Nausea And Vomiting   Asa [Aspirin] Rash    Pt takes ibuprofen without problems    ROS Review of Systems  Constitutional:  Positive for fatigue. Negative for chills and fever.  HENT:  Negative for sore throat.   Respiratory:  Positive for shortness of breath. Negative for wheezing.   Cardiovascular:  Negative for palpitations and leg swelling.  Gastrointestinal:  Negative for abdominal pain.     Objective:    Physical Exam Vitals reviewed.  Constitutional:      Appearance: Normal appearance.  HENT:     Right Ear: Tympanic membrane normal.     Left Ear: Tympanic membrane normal.  Cardiovascular:     Rate and Rhythm: Normal rate and regular rhythm.  Pulmonary:     Effort: Pulmonary effort is normal.     Breath sounds: Normal breath sounds. No wheezing or rales.  Musculoskeletal:     Cervical back: Neck supple.     Right lower leg: No edema.     Left lower leg: No edema.  Lymphadenopathy:     Cervical: No cervical adenopathy.  Neurological:     Mental Status: She is alert.    BP 130/84 (BP Location: Left Arm, Patient Position: Sitting, Cuff Size: Normal)    Pulse (!) 103    Temp 97.9 F (36.6 C) (Oral)    Wt 144 lb 14.4 oz (65.7 kg)    LMP 04/14/2012    SpO2 98%    BMI 27.38 kg/m  Wt Readings from Last 3 Encounters:  08/06/21 144 lb 14.4 oz (65.7 kg)  01/11/21 138 lb 8 oz (62.8 kg)  10/09/20 134 lb (60.8 kg)     Health Maintenance Due  Topic Date Due   Pneumococcal Vaccine 79-53 Years old (1 - PCV) Never done   HIV  Screening  Never done   Hepatitis C Screening  Never done   INFLUENZA VACCINE  03/04/2021    There are no preventive care reminders to display for this patient.  Lab Results  Component Value Date   TSH 3.10 01/09/2021   Lab Results  Component Value Date   WBC 5.3 01/09/2021   HGB 12.3 01/09/2021   HCT 38.0 01/09/2021   MCV 86.5 01/09/2021   PLT 325.0 01/09/2021   Lab Results  Component Value Date   NA 136 01/09/2021   K 5.0 01/09/2021   CO2 30 01/09/2021   GLUCOSE 102 (H) 01/09/2021   BUN 12 01/09/2021   CREATININE 0.79 01/09/2021   BILITOT 1.0 01/09/2021   ALKPHOS 52 01/09/2021   AST 15 01/09/2021   ALT 15 01/09/2021   PROT 7.0 01/09/2021   ALBUMIN 4.4 01/09/2021   CALCIUM 9.1 01/09/2021   GFR 88.09 01/09/2021   Lab Results  Component Value Date   CHOL 298 (H) 01/09/2021   Lab Results  Component Value Date   HDL 70.40 01/09/2021   Lab Results  Component Value Date   LDLCALC 208 (H) 01/09/2021   Lab Results  Component Value Date   TRIG 99.0 01/09/2021   Lab Results  Component Value Date   CHOLHDL 4 01/09/2021   Lab Results  Component Value Date   HGBA1C 6.4 01/09/2021      Assessment & Plan:   Problem List Items Addressed This Visit   None Visit Diagnoses     Dyspnea, unspecified type    -  Primary   Relevant Orders  DG Chest 2 View   CBC with Differential/Platelet   Fatigue, unspecified type       Relevant Orders   CMP   Vitamin B12     Patient presents with nonspecific symptoms of dyspnea and fatigue post COVID.  Difficult to sort out how much of this is related to recent COVID infection.  She is mostly concerned about the dyspnea.  Lung exam is unremarkable with no active wheezing.  Pulse oximetry 98%.  -Obtain PA and lateral chest x-ray -Check labs with CBC, comprehensive metabolic panel, and O03  No orders of the defined types were placed in this encounter.   Follow-up: No follow-ups on file.    Carolann Littler, MD

## 2021-08-07 ENCOUNTER — Other Ambulatory Visit (HOSPITAL_COMMUNITY): Payer: Self-pay

## 2021-08-07 ENCOUNTER — Other Ambulatory Visit: Payer: Self-pay | Admitting: Family Medicine

## 2021-08-07 DIAGNOSIS — J4520 Mild intermittent asthma, uncomplicated: Secondary | ICD-10-CM

## 2021-08-07 LAB — COMPREHENSIVE METABOLIC PANEL
ALT: 51 U/L — ABNORMAL HIGH (ref 0–35)
AST: 23 U/L (ref 0–37)
Albumin: 4 g/dL (ref 3.5–5.2)
Alkaline Phosphatase: 52 U/L (ref 39–117)
BUN: 9 mg/dL (ref 6–23)
CO2: 26 mEq/L (ref 19–32)
Calcium: 8.9 mg/dL (ref 8.4–10.5)
Chloride: 101 mEq/L (ref 96–112)
Creatinine, Ser: 0.79 mg/dL (ref 0.40–1.20)
GFR: 87.73 mL/min (ref >60.00)
Glucose, Bld: 104 mg/dL — ABNORMAL HIGH (ref 70–99)
Potassium: 3.6 mEq/L (ref 3.5–5.1)
Sodium: 135 mEq/L (ref 135–145)
Total Bilirubin: 0.8 mg/dL (ref 0.2–1.2)
Total Protein: 7 g/dL (ref 6.0–8.3)

## 2021-08-07 LAB — CBC WITH DIFFERENTIAL/PLATELET
Basophils Absolute: 0.1 10*3/uL (ref 0.0–0.1)
Basophils Relative: 1 % (ref 0.0–3.0)
Eosinophils Absolute: 0.1 10*3/uL (ref 0.0–0.7)
Eosinophils Relative: 1.4 % (ref 0.0–5.0)
HCT: 37.2 % (ref 36.0–46.0)
Hemoglobin: 12.4 g/dL (ref 12.0–15.0)
Lymphocytes Relative: 30.2 % (ref 12.0–46.0)
Lymphs Abs: 2.5 10*3/uL (ref 0.7–4.0)
MCHC: 33.3 g/dL (ref 30.0–36.0)
MCV: 90.8 fl (ref 78.0–100.0)
Monocytes Absolute: 0.7 10*3/uL (ref 0.1–1.0)
Monocytes Relative: 8.3 % (ref 3.0–12.0)
Neutro Abs: 4.8 10*3/uL (ref 1.4–7.7)
Neutrophils Relative %: 59.1 % (ref 43.0–77.0)
Platelets: 351 10*3/uL (ref 150.0–400.0)
RBC: 4.1 Mil/uL (ref 3.87–5.11)
RDW: 14.5 % (ref 11.5–15.5)
WBC: 8.2 10*3/uL (ref 4.0–10.5)

## 2021-08-07 LAB — VITAMIN B12: Vitamin B-12: 1030 pg/mL — ABNORMAL HIGH (ref 211–911)

## 2021-08-07 MED ORDER — LEVALBUTEROL HCL 1.25 MG/3ML IN NEBU
INHALATION_SOLUTION | RESPIRATORY_TRACT | 1 refills | Status: DC
  Filled 2021-08-07: qty 75, 4d supply, fill #0
  Filled 2022-03-05: qty 75, 4d supply, fill #1

## 2021-08-07 MED ORDER — AMPHETAMINE-DEXTROAMPHETAMINE 10 MG PO TABS
ORAL_TABLET | ORAL | 0 refills | Status: DC
  Filled 2021-08-07: qty 60, fill #0
  Filled 2021-12-20: qty 60, 30d supply, fill #0

## 2021-08-07 MED ORDER — AMPHETAMINE-DEXTROAMPHETAMINE 10 MG PO TABS
ORAL_TABLET | Freq: Two times a day (BID) | ORAL | 0 refills | Status: DC
  Filled 2021-08-07 – 2021-08-19 (×2): qty 60, 30d supply, fill #0

## 2021-08-07 MED ORDER — AMPHETAMINE-DEXTROAMPHETAMINE 10 MG PO TABS
ORAL_TABLET | ORAL | 0 refills | Status: DC
  Filled 2021-08-07: qty 60, fill #0
  Filled 2021-10-24: qty 60, 30d supply, fill #0

## 2021-08-07 MED FILL — Estradiol Tab 0.5 MG: ORAL | 90 days supply | Qty: 90 | Fill #3 | Status: CN

## 2021-08-07 NOTE — Telephone Encounter (Signed)
Last refill per controlled substance database: 07/18/21 Last OV: 08/06/21 acute f/u; CPE 01/11/21 Next OV: none scheduled

## 2021-08-08 ENCOUNTER — Other Ambulatory Visit: Payer: Self-pay

## 2021-08-08 ENCOUNTER — Other Ambulatory Visit (HOSPITAL_COMMUNITY): Payer: Self-pay

## 2021-08-08 DIAGNOSIS — R7401 Elevation of levels of liver transaminase levels: Secondary | ICD-10-CM

## 2021-08-19 ENCOUNTER — Other Ambulatory Visit (HOSPITAL_COMMUNITY): Payer: Self-pay

## 2021-10-01 DIAGNOSIS — H5213 Myopia, bilateral: Secondary | ICD-10-CM | POA: Diagnosis not present

## 2021-10-01 DIAGNOSIS — H524 Presbyopia: Secondary | ICD-10-CM | POA: Diagnosis not present

## 2021-10-17 ENCOUNTER — Ambulatory Visit (INDEPENDENT_AMBULATORY_CARE_PROVIDER_SITE_OTHER): Payer: 59 | Admitting: Obstetrics & Gynecology

## 2021-10-17 ENCOUNTER — Other Ambulatory Visit: Payer: Self-pay

## 2021-10-17 ENCOUNTER — Other Ambulatory Visit (HOSPITAL_COMMUNITY): Payer: Self-pay

## 2021-10-17 ENCOUNTER — Encounter (HOSPITAL_BASED_OUTPATIENT_CLINIC_OR_DEPARTMENT_OTHER): Payer: Self-pay | Admitting: Obstetrics & Gynecology

## 2021-10-17 VITALS — BP 146/98 | HR 78 | Ht 61.0 in | Wt 147.2 lb

## 2021-10-17 DIAGNOSIS — E785 Hyperlipidemia, unspecified: Secondary | ICD-10-CM | POA: Diagnosis not present

## 2021-10-17 DIAGNOSIS — Z01419 Encounter for gynecological examination (general) (routine) without abnormal findings: Secondary | ICD-10-CM

## 2021-10-17 DIAGNOSIS — Z90710 Acquired absence of both cervix and uterus: Secondary | ICD-10-CM

## 2021-10-17 DIAGNOSIS — Z79890 Hormone replacement therapy (postmenopausal): Secondary | ICD-10-CM | POA: Diagnosis not present

## 2021-10-17 MED ORDER — ESTRADIOL 0.5 MG PO TABS
ORAL_TABLET | Freq: Every day | ORAL | 4 refills | Status: DC
  Filled 2021-10-17 – 2021-12-20 (×2): qty 90, 90d supply, fill #0
  Filled 2022-04-28: qty 90, 90d supply, fill #1
  Filled 2022-07-13: qty 90, 90d supply, fill #2
  Filled 2022-08-26 – 2022-10-17 (×2): qty 90, 90d supply, fill #3

## 2021-10-17 NOTE — Progress Notes (Signed)
51 y.o. G3P0202 Married [place] female here for annual exam.  Doing well.  Just got back from [place] with her oldest is a Museum/gallery exhibitions officer at [employer].  Denies vaginal bleeding.  On 0.49m estradiol.  Doing well on this.   ? ?Patient's last menstrual period was 04/14/2012.          ? ?Health Maintenance: ?Pap:  hysterectomy 2013 ?History of abnormal Pap:  no ?MMG:  02/15/2021 Negative ?Colonoscopy:  10/2020 negative ?Screening Labs: 08/06/2021 ? ? reports that she has never smoked. She has never used smokeless tobacco. She reports that she does not drink alcohol and does not use drugs. ? ?Past Medical History:  ?Diagnosis Date  ? Adult ADHD   ? Allergy   ? Arthritis   ? hands  ? Asthma   ? ?, inhaler use as needed  ? Complication of anesthesia 1990  ? muscle relaxant caused lung to collapse  ? Difficult intubation 1990  ? Dyslipidemia   ? History of chicken pox   ? IBS (irritable bowel syndrome)   ? ?  ? Migraine   ? ? ?Past Surgical History:  ?Procedure Laterality Date  ? ABDOMINAL HYSTERECTOMY  05/2012  ? ABDOMINOPLASTY  05/04/2012  ? Procedure: ABDOMINOPLASTY;  Surgeon: DCrissie Reese MD;  Location: [facility];  Service: Plastics;  Laterality: N/A;  ? ANAL FISSURE REPAIR    ? age 51 ? EYE SURGERY Left   ? age 51 ? GYNECOLOGIC CRYOSURGERY    ? WISDOM TOOTH EXTRACTION    ? ? ?Current Outpatient Medications  ?Medication Sig Dispense Refill  ? amphetamine-dextroamphetamine (ADDERALL) 10 MG tablet TAKE 1 TABLET BY MOUTH 2 TIMES DAILY. 60 tablet 0  ? amphetamine-dextroamphetamine (ADDERALL) 10 MG tablet Take 1 tablet by mouth 2 times a day 60 tablet 0  ? amphetamine-dextroamphetamine (ADDERALL) 10 MG tablet Take one tablet by mouth two times daily.  May refill in two months. 60 tablet 0  ? budesonide-formoterol (SYMBICORT) 80-4.5 MCG/ACT inhaler INHALE 2 PUFFS BY MOUTH INTO THE LUNGS 2 TIMES DAILY. 10.2 g 5  ? cyanocobalamin (,VITAMIN B-12,) 1000 MCG/ML injection INJECT 1 ML INTO THE MUSCLE ONCE EVERY 2 WEEKS FOR 2 MONTHS, THEN ONCE MONTHLY 10  mL 2  ? cyanocobalamin (,VITAMIN B-12,) 1000 MCG/ML injection INJECT 1 ML INTO THE MUSCLE ONCE EVERY 2 WEEKS FOR 2 MONTHS, THEN ONCE MONTHLY 10 mL 2  ? ketotifen (ZADITOR) 0.025 % ophthalmic solution Place 1 drop into both eyes 2 (two) times daily.    ? levalbuterol (XOPENEX HFA) 45 MCG/ACT inhaler INHALE 1 - 2 PUFFS INTO THE LUNGS EVERY 6 HOURS AS NEEDED FOR WHEEZING 15 g 3  ? levalbuterol (XOPENEX) 1.25 MG/3ML nebulizer solution USE 1 VIAL IN NEBULIZER EVERY 4 HOURS AS NEEDED FOR WHEEZING. 75 mL 1  ? levocetirizine (XYZAL) 5 MG tablet TAKE 1 TABLET BY MOUTH ONCE DAILY AS NEEDED 90 tablet 3  ? methocarbamol (ROBAXIN) 500 MG tablet Take 1 tablet by mouth three times a day as needed for pain and spasms. May cause sedation. 60 tablet 0  ? mometasone (ELOCON) 0.1 % cream APPLY TO THE AFFECTED AREA(S) DAILY AS NEEDED 45 g 11  ? mometasone (NASONEX) 50 MCG/ACT nasal spray INSTILL 2 SPRAYS INTO NOSE ONCE DAILY AS DIRECTED. Please call [phone] appt for refills 51 g 0  ? NEEDLE, DISP, 25 G (BD ECLIPSE NEEDLE) 25G X 5/8" MISC Use as directed. 10 each 5  ? predniSONE (STERAPRED UNI-PAK 21 TAB) 10 MG (21) TBPK  tablet Take 6 tablets by mouth on day one. Decrease by one tablet daily until all tablets are gone (6-5-4-3-2-1) 21 tablet 0  ? ranitidine (ZANTAC) 150 MG capsule Take 150 mg by mouth 2 (two) times daily.     ? tiZANidine (ZANAFLEX) 2 MG tablet Take 1 tablet by mouth at bedtime for muscle spasm 30 tablet 0  ? Vitamin D, Ergocalciferol, (DRISDOL) 1.25 MG (50000 UNIT) CAPS capsule TAKE 1 CAPSULE BY MOUTH ONCE WEEKLY 12 capsule 3  ? Vitamin D, Ergocalciferol, (DRISDOL) 1.25 MG (50000 UNIT) CAPS capsule TAKE 1 CAPSULE BY MOUTH ONCE WEEKLY 12 capsule 3  ? estradiol (ESTRACE) 0.5 MG tablet TAKE 1 TABLET BY MOUTH ONCE A DAY 90 tablet 4  ? ?No current facility-administered medications for this visit.  ? ? ?Family History  ?Problem Relation Age of Onset  ? Arthritis Mother   ? Hyperlipidemia Mother   ? Glaucoma Mother    ? Irritable bowel syndrome Mother   ? Cataracts Mother   ? Hyperlipidemia Father   ? Heart disease Father 46  ?     CAD  ? Cataracts Father   ? Thyroid disease Sister   ? Hyperlipidemia Brother   ? Ulcerative colitis Brother   ? ? ?Review of Systems  ?All other systems reviewed and are negative. ? ?Exam:   ?BP (!) 146/98 (BP Location: Left Arm, Patient Position: Sitting, Cuff Size: Normal)   Pulse 78   Ht 5' 1"  (1.549 m) Comment: reported  Wt 147 lb 3.2 oz (66.8 kg)   LMP 04/14/2012   BMI 27.81 kg/m?   Height: 5' 1"  (154.9 cm) (reported) ? ?General appearance: alert, cooperative and appears stated age ?Head: Normocephalic, without obvious abnormality, atraumatic ?Neck: no adenopathy, supple, symmetrical, trachea midline and thyroid normal to inspection and palpation ?Lungs: clear to auscultation bilaterally ?Breasts: normal appearance, no masses or tenderness ?Heart: regular rate and rhythm ?Abdomen: soft, non-tender; bowel sounds normal; no masses,  no organomegaly ?Extremities: extremities normal, atraumatic, no cyanosis or edema ?Skin: Skin color, texture, turgor normal. No rashes or lesions ?Lymph nodes: Cervical, supraclavicular, and axillary nodes normal. ?No abnormal inguinal nodes palpated ?Neurologic: Grossly normal ? ? ?Pelvic: External genitalia:  no lesions ?             Urethra:  normal appearing urethra with no masses, tenderness or lesions ?             Bartholins and Skenes: normal    ?             Vagina: normal appearing vagina with normal color and no discharge, no lesions ?             Cervix: absent ?             Pap taken: No. ?Bimanual Exam:  Uterus:  uterus absent ?             Adnexa: normal adnexa and no mass, fullness, tenderness ?              Rectovaginal: Confirms ?              Anus:  normal sphincter tone, no lesions ? ?Chaperone, Octaviano Batty, CMA, was present for exam. ? ?Assessment/Plan: ?1. Well woman exam with routine gynecological exam ?- negative pathology with  hysterectomy ?- MMG 02/2021 ?- cologuard 2022.  Repeat 2025. ?- plan BMD next year ?- lab work done with Dr. Elease Hashimoto ? ?2. H/O: hysterectomy ? ?3. Elevated lipids ? ?  4. Hormone replacement therapy ?- estradiol (ESTRACE) 0.5 MG tablet; TAKE 1 TABLET BY MOUTH ONCE A DAY  Dispense: 90 tablet; Refill: 4 ? ? ? ?

## 2021-10-24 ENCOUNTER — Other Ambulatory Visit (HOSPITAL_COMMUNITY): Payer: Self-pay

## 2021-10-25 ENCOUNTER — Other Ambulatory Visit (HOSPITAL_COMMUNITY): Payer: Self-pay

## 2021-11-26 ENCOUNTER — Ambulatory Visit: Payer: 59 | Admitting: Orthopaedic Surgery

## 2021-11-26 ENCOUNTER — Ambulatory Visit (INDEPENDENT_AMBULATORY_CARE_PROVIDER_SITE_OTHER): Payer: 59

## 2021-11-26 DIAGNOSIS — G8929 Other chronic pain: Secondary | ICD-10-CM

## 2021-11-26 DIAGNOSIS — M25562 Pain in left knee: Secondary | ICD-10-CM

## 2021-11-26 NOTE — Progress Notes (Signed)
? ?Office Visit Note ?  ?Patient: Marissa Horn           ?Date of Birth: 05/20/1971           ?MRN: [id number] ?Visit Date: 11/26/2021 ?             ?Requested by: Eulas Post, MD ?[address] ?[place],  [place] [id number] ?PCP: Eulas Post, MD ? ? ?Assessment & Plan: ?Visit Diagnoses:  ?1. Chronic pain of left knee   ? ? ?Plan: Insidious onset of left knee pain approximately 2 months ago.  No history of injury or trauma.  Pain is localized along the anterior medial compartment of her knee.  There is been no swelling.  Some mild anterior knee pain.  No pain laterally.  Films reveal a very minimal decrease in the medial joint space with some subchondral sclerosis in the medial tibial plateau.  I believe that her pain is probably related to some early arthritis.  There is no posterior medial joint pain or popping or clicking.  There is no history of injury or trauma so I do not think there is any meniscal pathology.  The knee is otherwise stable.  Long discussion regarding use of over-the-counter medicines or even Voltaren gel.  Also could consider cortisone injection but she preferred to wait and give a little bit more time.  Also might consider an MRI scan if does not improve to be sure there is no internal derangement. ? ?Also experiencing some chronic low back pain which may be related to her sitting all day long at her place of the employment.  There has been no radicular pain.  We will provide lumbosacral spine x-rays and then reevaluate over time if no improvement.  Consider films of her back at that point. ? ?Follow-Up Instructions: Return if symptoms worsen or fail to improve.  ? ?Orders:  ?Orders Placed This Encounter  ?Procedures  ? XR KNEE 3 VIEW LEFT  ? ?No orders of the defined types were placed in this encounter. ? ? ? ? Procedures: ?No procedures performed ? ? ?Clinical Data: ?No additional findings. ? ? ?Subjective: ?Chief Complaint  ?Patient presents with  ? Left Knee -  Pain  ?Patient presents today for left knee pain. She said that she has been having pain for two months. No known injury. She said that her pain is located medially. She said that sometimes it feels unstable. She takes Advil if needed. ? ?HPI ? ?Review of Systems ? ? ?Objective: ?Vital Signs: LMP 04/14/2012  ? ?Physical Exam ?Constitutional:   ?   Appearance: She is well-developed.  ?Eyes:  ?   Pupils: Pupils are equal, round, and reactive to light.  ?Pulmonary:  ?   Effort: Pulmonary effort is normal.  ?Skin: ?   General: Skin is warm and dry.  ?Neurological:  ?   Mental Status: She is alert and oriented to person, place, and time.  ?Psychiatric:     ?   Behavior: Behavior normal.  ? ? ?Ortho Exam awake alert and oriented x3.  Comfortable sitting.  Left knee was not hot red warm or swollen.  No effusion.  Some anterior medial joint pain.  No posterior medial pain.  Very minimal patella crepitation but no pain with patella compression.  No lateral pain.  Full extension flexed over 100 degrees.  No opening with varus or valgus stress or anterior drawer sign compared to the right knee.  No popliteal pain or calf  discomfort. ? ?Straight leg raise negative.  Painless range of motion both hips.  No percussible tenderness lower lumbar spine ? ? ? ? ? ? ? ? ? ? ? ? ? ? ? ? ? ? ? ? ? ? ?Specialty Comments:  ?No specialty comments available. ? ?Imaging: ?XR KNEE 3 VIEW LEFT ? ?Result Date: 11/26/2021 ?Films of the left knee obtained in 3 projections standing.  There may be very minimal decrease in the medial joint space without ectopic calcification.  There is some mild subchondral sclerosis in the tibial side of the medial compartment.  Alignment appears to be neutral.  Films probably consistent with very minimal arthritis.  ? ? ?PMFS History: ?Patient Active Problem List  ? Diagnosis Date Noted  ? Pain in left knee 11/26/2021  ? Labial abscess 10/10/2020  ? History of total abdominal hysterectomy 10/10/2020  ? Hormone  replacement therapy 10/10/2020  ? Left tennis elbow 05/05/2019  ? Neck pain 03/01/2019  ? Adult ADHD 02/17/2018  ? Cough variant asthma vs VCD 01/01/2017  ? Vitamin D deficiency 07/25/2015  ? Vitamin B 12 deficiency 07/25/2015  ? Perennial allergic rhinitis 09/26/2014  ? IBS (irritable bowel syndrome) 05/10/2014  ? Elevated lipids 12/15/2012  ? Overactive bladder 05/04/2012  ? Asthma, moderate persistent 01/14/2012  ? ?Past Medical History:  ?Diagnosis Date  ? Adult ADHD   ? Allergy   ? Arthritis   ? hands  ? Asthma   ? ?, inhaler use as needed  ? Complication of anesthesia 1990  ? muscle relaxant caused lung to collapse  ? Difficult intubation 1990  ? Dyslipidemia   ? History of chicken pox   ? IBS (irritable bowel syndrome)   ? ?  ? Migraine   ?  ?Family History  ?Problem Relation Age of Onset  ? Arthritis Mother   ? Hyperlipidemia Mother   ? Glaucoma Mother   ? Irritable bowel syndrome Mother   ? Cataracts Mother   ? Hyperlipidemia Father   ? Heart disease Father 23  ?     CAD  ? Cataracts Father   ? Thyroid disease Sister   ? Hyperlipidemia Brother   ? Ulcerative colitis Brother   ?  ?Past Surgical History:  ?Procedure Laterality Date  ? ABDOMINOPLASTY  05/04/2012  ? Procedure: ABDOMINOPLASTY;  Surgeon: Crissie Reese, MD;  Location: [facility];  Service: Plastics;  Laterality: N/A;  ? ANAL FISSURE REPAIR    ? age 77  ? EYE SURGERY Left   ? age 42  ? GYNECOLOGIC CRYOSURGERY    ? laparoscopic hysterectomy  05/04/2012  ? WISDOM TOOTH EXTRACTION    ? ?Social History  ? ?Occupational History  ? Occupation: clinical dietitian  ?Tobacco Use  ? Smoking status: Never  ? Smokeless tobacco: Never  ?Vaping Use  ? Vaping Use: Never used  ?Substance and Sexual Activity  ? Alcohol use: No  ? Drug use: No  ? Sexual activity: Yes  ?  Partners: Male  ?  Birth control/protection: Surgical  ?  Comment: hysterectomy  ? ? ? ? ? ? ?

## 2021-12-20 ENCOUNTER — Other Ambulatory Visit (HOSPITAL_COMMUNITY): Payer: Self-pay

## 2021-12-23 ENCOUNTER — Other Ambulatory Visit (HOSPITAL_COMMUNITY): Payer: Self-pay

## 2022-01-14 ENCOUNTER — Other Ambulatory Visit (HOSPITAL_COMMUNITY): Payer: Self-pay

## 2022-01-27 ENCOUNTER — Other Ambulatory Visit (HOSPITAL_COMMUNITY): Payer: Self-pay

## 2022-01-27 ENCOUNTER — Other Ambulatory Visit: Payer: Self-pay | Admitting: Family Medicine

## 2022-01-27 DIAGNOSIS — J4520 Mild intermittent asthma, uncomplicated: Secondary | ICD-10-CM

## 2022-01-27 MED ORDER — AMPHETAMINE-DEXTROAMPHETAMINE 10 MG PO TABS
ORAL_TABLET | ORAL | 0 refills | Status: DC
  Filled 2022-01-27: qty 60, 30d supply, fill #0

## 2022-01-27 MED ORDER — LEVOCETIRIZINE DIHYDROCHLORIDE 5 MG PO TABS
5.0000 mg | ORAL_TABLET | Freq: Every day | ORAL | 0 refills | Status: DC | PRN
  Filled 2022-01-27: qty 90, 90d supply, fill #0

## 2022-01-27 MED ORDER — AMPHETAMINE-DEXTROAMPHETAMINE 10 MG PO TABS
ORAL_TABLET | ORAL | 0 refills | Status: DC
  Filled 2022-01-27 – 2022-04-28 (×3): qty 60, 30d supply, fill #0

## 2022-01-27 MED ORDER — AMPHETAMINE-DEXTROAMPHETAMINE 10 MG PO TABS
ORAL_TABLET | Freq: Two times a day (BID) | ORAL | 0 refills | Status: DC
  Filled 2022-01-27: qty 60, fill #0
  Filled 2022-03-05: qty 60, 30d supply, fill #0
  Filled 2022-03-05: qty 60, fill #0

## 2022-02-13 ENCOUNTER — Other Ambulatory Visit: Payer: Self-pay | Admitting: Obstetrics & Gynecology

## 2022-02-13 DIAGNOSIS — Z1231 Encounter for screening mammogram for malignant neoplasm of breast: Secondary | ICD-10-CM

## 2022-02-17 ENCOUNTER — Ambulatory Visit
Admission: RE | Admit: 2022-02-17 | Discharge: 2022-02-17 | Disposition: A | Discharge status: Home / Self Care | Payer: 59 | Source: Ambulatory Visit | Attending: Obstetrics & Gynecology | Admitting: Obstetrics & Gynecology

## 2022-02-17 DIAGNOSIS — Z1231 Encounter for screening mammogram for malignant neoplasm of breast: Secondary | ICD-10-CM

## 2022-03-05 ENCOUNTER — Other Ambulatory Visit (HOSPITAL_COMMUNITY): Payer: Self-pay

## 2022-03-06 ENCOUNTER — Other Ambulatory Visit (HOSPITAL_COMMUNITY): Payer: Self-pay

## 2022-03-17 ENCOUNTER — Other Ambulatory Visit (HOSPITAL_COMMUNITY): Payer: Self-pay

## 2022-03-28 ENCOUNTER — Ambulatory Visit (INDEPENDENT_AMBULATORY_CARE_PROVIDER_SITE_OTHER): Payer: 59 | Admitting: Pediatrics

## 2022-03-28 ENCOUNTER — Encounter: Payer: Self-pay | Admitting: Pediatrics

## 2022-03-28 DIAGNOSIS — Z23 Encounter for immunization: Secondary | ICD-10-CM | POA: Diagnosis not present

## 2022-03-28 NOTE — Patient Instructions (Signed)
At [facility] we value your feedback. You may receive a survey about your visit today. Please share your experience as we strive to create trusting relationships with our patients to provide genuine, compassionate, quality care. ° °

## 2022-03-28 NOTE — Progress Notes (Unsigned)
Flu vaccine per orders. Indications, contraindications and side effects of vaccine/vaccines discussed with parent and parent verbally expressed understanding and also agreed with the administration of vaccine/vaccines as ordered above today.Handout (VIS) given for each vaccine at this visit. ° °

## 2022-03-29 ENCOUNTER — Encounter: Payer: Self-pay | Admitting: Family Medicine

## 2022-04-28 ENCOUNTER — Other Ambulatory Visit: Payer: Self-pay | Admitting: Family Medicine

## 2022-04-28 ENCOUNTER — Other Ambulatory Visit (HOSPITAL_COMMUNITY): Payer: Self-pay

## 2022-04-28 DIAGNOSIS — E559 Vitamin D deficiency, unspecified: Secondary | ICD-10-CM

## 2022-04-29 ENCOUNTER — Other Ambulatory Visit (HOSPITAL_COMMUNITY): Payer: Self-pay

## 2022-04-29 MED ORDER — VITAMIN D (ERGOCALCIFEROL) 1.25 MG (50000 UNIT) PO CAPS
ORAL_CAPSULE | ORAL | 0 refills | Status: DC
  Filled 2022-04-29: qty 12, 84d supply, fill #0

## 2022-06-17 ENCOUNTER — Encounter: Payer: Self-pay | Admitting: Family Medicine

## 2022-06-20 ENCOUNTER — Other Ambulatory Visit (HOSPITAL_COMMUNITY): Payer: Self-pay

## 2022-06-20 ENCOUNTER — Ambulatory Visit: Payer: 59 | Admitting: Family Medicine

## 2022-06-20 VITALS — BP 142/90 | HR 83 | Temp 97.7°F | Wt 146.0 lb

## 2022-06-20 DIAGNOSIS — F909 Attention-deficit hyperactivity disorder, unspecified type: Secondary | ICD-10-CM

## 2022-06-20 DIAGNOSIS — Z7184 Encounter for health counseling related to travel: Secondary | ICD-10-CM | POA: Diagnosis not present

## 2022-06-20 DIAGNOSIS — R030 Elevated blood-pressure reading, without diagnosis of hypertension: Secondary | ICD-10-CM

## 2022-06-20 MED ORDER — AMPHETAMINE-DEXTROAMPHETAMINE 15 MG PO TABS
15.0000 mg | ORAL_TABLET | Freq: Two times a day (BID) | ORAL | 0 refills | Status: DC
  Filled 2022-06-20: qty 60, 30d supply, fill #0

## 2022-06-20 MED ORDER — MEFLOQUINE HCL 250 MG PO TABS
ORAL_TABLET | ORAL | 0 refills | Status: DC
  Filled 2022-06-20: qty 8, 56d supply, fill #0

## 2022-06-20 MED ORDER — VIVOTIF PO CPDR
1.0000 | DELAYED_RELEASE_CAPSULE | ORAL | 0 refills | Status: DC
  Filled 2022-06-20: qty 4, 8d supply, fill #0

## 2022-06-20 NOTE — Progress Notes (Signed)
Established Patient Office Visit  Subjective   Patient ID: Marissa Horn, female    DOB: 07/13/1971  Age: 51 y.o. MRN: [id number]  Chief Complaint  Patient presents with   Follow-up    HPI   Jaidon had sent message earlier this week regarding ADD medications.  She has been on Adderall 10 mg twice daily for quite some time.  Initially, the seem to be working well but recently she feels like this is lost some of its effect.  She would like to consider further titration.  She does like immediate release versus extended release.  Denies any side effects from current dosage.  She is getting ready to go to [place] with her children in December.  Will be gone couple of weeks.  Needs malaria prevention and also typhoid prevention.  Be traveling mostly to [place].  Blood pressure is elevated somewhat today but did improve some after rest.  She is a non-smoker.  Does not drink alcohol regularly.  No recent headaches or dizziness.  Past Medical History:  Diagnosis Date   Adult ADHD    Allergy    Arthritis    hands   Asthma    ?, inhaler use as needed   Complication of anesthesia 1990   muscle relaxant caused lung to collapse   Difficult intubation 1990   Dyslipidemia    History of chicken pox    IBS (irritable bowel syndrome)    ?   Migraine    Past Surgical History:  Procedure Laterality Date   ABDOMINOPLASTY  05/04/2012   Procedure: ABDOMINOPLASTY;  Surgeon: Etter Sjogren, MD;  Location: [facility];  Service: Plastics;  Laterality: N/A;   ANAL FISSURE REPAIR     age 33   EYE SURGERY Left    age 33   GYNECOLOGIC CRYOSURGERY     laparoscopic hysterectomy  05/04/2012   WISDOM TOOTH EXTRACTION      reports that she has never smoked. She has never used smokeless tobacco. She reports that she does not drink alcohol and does not use drugs. family history includes Arthritis in her mother; Cataracts in her father and mother; Glaucoma in her mother; Heart disease (age of onset:  58) in her father; Hyperlipidemia in her brother, father, and mother; Irritable bowel syndrome in her mother; Thyroid disease in her sister; Ulcerative colitis in her brother. Allergies  Allergen Reactions   Scopolamine Anaphylaxis   Vyvanse [Lisdexamfetamine Dimesylate]     Severe migraine   Bactrim [Sulfamethoxazole-Trimethoprim]     Joint pain, fever   Biaxin [Clarithromycin] Nausea And Vomiting    Projectile vomiting   Tamiflu [Oseltamivir Phosphate] Nausea And Vomiting    Projectile vomiting   Cephalexin Nausea And Vomiting   Asa [Aspirin] Rash    Pt takes ibuprofen without problems    Review of Systems  Constitutional:  Negative for malaise/fatigue.  Eyes:  Negative for blurred vision.  Respiratory:  Negative for shortness of breath.   Cardiovascular:  Negative for chest pain.  Neurological:  Negative for dizziness, weakness and headaches.      Objective:     BP (!) 142/90 (BP Location: Left Arm, Cuff Size: Normal)   Pulse 83   Temp 97.7 F (36.5 C) (Oral)   Wt 146 lb (66.2 kg)   LMP 04/04/2012   SpO2 97%   BMI 27.59 kg/m  BP Readings from Last 3 Encounters:  06/20/22 (!) 142/90  10/17/21 (!) 146/98  08/06/21 130/84   Wt Readings from Last  3 Encounters:  06/20/22 146 lb (66.2 kg)  10/17/21 147 lb 3.2 oz (66.8 kg)  08/06/21 144 lb 14.4 oz (65.7 kg)      Physical Exam Vitals reviewed.  Constitutional:      Appearance: She is well-developed.  Eyes:     Pupils: Pupils are equal, round, and reactive to light.  Neck:     Thyroid: No thyromegaly.     Vascular: No JVD.  Cardiovascular:     Rate and Rhythm: Normal rate and regular rhythm.     Heart sounds:     No gallop.  Pulmonary:     Effort: Pulmonary effort is normal. No respiratory distress.     Breath sounds: Normal breath sounds. No wheezing or rales.  Musculoskeletal:     Cervical back: Neck supple.  Neurological:     Mental Status: She is alert.      No results found for any visits on  06/20/22.    The 10-year ASCVD risk score (Arnett DK, et al., 2019) is: 1.9%    Assessment & Plan:   #1 ADD.  Patient would like to consider increase in Adderall to 15 mg twice daily which seems reasonable.  New prescription sent.  Give feedback in 1 month.  #2 travel advice encounter.  Upcoming trip to [place].  Discussed food and safe fluid consumption.  Vivotif 1 tablet every other day for 4 doses.  Mefloquine 250 mg once weekly starting 2 weeks prior to travel, during travel, and for 4 weeks after return.  #3 elevated blood pressure.  No history of hypertension.  We recommend consistent aerobic exercise, weight control, close observation for now.  Monitor regularly at home and be in touch if consistently greater than 140 systolic or 90 diastolic  Evelena Peat, MD

## 2022-06-23 ENCOUNTER — Other Ambulatory Visit (HOSPITAL_COMMUNITY): Payer: Self-pay

## 2022-06-24 ENCOUNTER — Other Ambulatory Visit (HOSPITAL_COMMUNITY): Payer: Self-pay

## 2022-07-13 ENCOUNTER — Other Ambulatory Visit: Payer: Self-pay | Admitting: Family Medicine

## 2022-07-13 DIAGNOSIS — E559 Vitamin D deficiency, unspecified: Secondary | ICD-10-CM

## 2022-07-13 DIAGNOSIS — J4520 Mild intermittent asthma, uncomplicated: Secondary | ICD-10-CM

## 2022-07-14 ENCOUNTER — Other Ambulatory Visit: Payer: Self-pay

## 2022-07-14 ENCOUNTER — Encounter: Payer: Self-pay | Admitting: Family Medicine

## 2022-07-14 ENCOUNTER — Other Ambulatory Visit (HOSPITAL_COMMUNITY): Payer: Self-pay

## 2022-07-14 DIAGNOSIS — J4520 Mild intermittent asthma, uncomplicated: Secondary | ICD-10-CM

## 2022-07-14 DIAGNOSIS — E559 Vitamin D deficiency, unspecified: Secondary | ICD-10-CM

## 2022-07-14 MED ORDER — BUDESONIDE-FORMOTEROL FUMARATE 80-4.5 MCG/ACT IN AERO
2.0000 | INHALATION_SPRAY | Freq: Two times a day (BID) | RESPIRATORY_TRACT | 2 refills | Status: DC
  Filled 2022-07-14 – 2022-08-26 (×2): qty 10.2, 30d supply, fill #0
  Filled 2022-11-11 (×2): qty 10.2, 30d supply, fill #1
  Filled 2022-12-14: qty 10.2, 30d supply, fill #2

## 2022-07-14 MED ORDER — VITAMIN D (ERGOCALCIFEROL) 1.25 MG (50000 UNIT) PO CAPS
50000.0000 [IU] | ORAL_CAPSULE | ORAL | 0 refills | Status: DC
  Filled 2022-07-14 – 2022-08-26 (×2): qty 12, 84d supply, fill #0

## 2022-07-14 MED ORDER — AMPHETAMINE-DEXTROAMPHETAMINE 15 MG PO TABS
15.0000 mg | ORAL_TABLET | Freq: Two times a day (BID) | ORAL | 0 refills | Status: DC
  Filled 2022-07-14 – 2022-08-11 (×3): qty 60, 30d supply, fill #0

## 2022-07-14 MED ORDER — LEVALBUTEROL HCL 1.25 MG/3ML IN NEBU
1.0000 | INHALATION_SOLUTION | RESPIRATORY_TRACT | 1 refills | Status: DC | PRN
  Filled 2022-07-14 – 2023-06-26 (×3): qty 75, 5d supply, fill #0

## 2022-07-14 MED ORDER — KETOTIFEN FUMARATE 0.025 % OP SOLN
1.0000 [drp] | Freq: Two times a day (BID) | OPHTHALMIC | 0 refills | Status: AC
  Filled 2022-07-14: qty 5, 50d supply, fill #0
  Filled 2022-08-26: qty 5, 25d supply, fill #0

## 2022-07-14 MED ORDER — LEVOCETIRIZINE DIHYDROCHLORIDE 5 MG PO TABS
5.0000 mg | ORAL_TABLET | Freq: Every day | ORAL | 0 refills | Status: DC | PRN
  Filled 2022-07-14: qty 90, 90d supply, fill #0

## 2022-07-14 MED ORDER — LEVALBUTEROL TARTRATE 45 MCG/ACT IN AERO
1.0000 | INHALATION_SPRAY | Freq: Four times a day (QID) | RESPIRATORY_TRACT | 3 refills | Status: DC | PRN
  Filled 2022-07-14: qty 15, 25d supply, fill #0
  Filled 2022-08-26: qty 15, 25d supply, fill #1

## 2022-07-14 NOTE — Telephone Encounter (Signed)
Adderall refilled  Marissa Covey MD [facility]

## 2022-07-14 NOTE — Telephone Encounter (Signed)
Vit D refilled.

## 2022-07-16 ENCOUNTER — Other Ambulatory Visit (HOSPITAL_COMMUNITY): Payer: Self-pay

## 2022-07-18 ENCOUNTER — Other Ambulatory Visit (HOSPITAL_COMMUNITY): Payer: Self-pay

## 2022-07-21 ENCOUNTER — Other Ambulatory Visit (HOSPITAL_COMMUNITY): Payer: Self-pay

## 2022-07-22 ENCOUNTER — Other Ambulatory Visit (HOSPITAL_COMMUNITY): Payer: Self-pay

## 2022-07-24 ENCOUNTER — Other Ambulatory Visit (HOSPITAL_COMMUNITY): Payer: Self-pay

## 2022-07-25 ENCOUNTER — Other Ambulatory Visit (HOSPITAL_COMMUNITY): Payer: Self-pay

## 2022-08-09 ENCOUNTER — Other Ambulatory Visit (HOSPITAL_COMMUNITY): Payer: Self-pay

## 2022-08-11 ENCOUNTER — Other Ambulatory Visit (HOSPITAL_COMMUNITY): Payer: Self-pay

## 2022-08-11 ENCOUNTER — Other Ambulatory Visit: Payer: Self-pay

## 2022-08-26 ENCOUNTER — Other Ambulatory Visit: Payer: Self-pay | Admitting: Family Medicine

## 2022-08-26 ENCOUNTER — Encounter: Payer: Self-pay | Admitting: Family Medicine

## 2022-08-27 ENCOUNTER — Other Ambulatory Visit: Payer: Self-pay

## 2022-08-27 ENCOUNTER — Other Ambulatory Visit (HOSPITAL_COMMUNITY): Payer: Self-pay

## 2022-08-27 MED ORDER — AMPHETAMINE-DEXTROAMPHETAMINE 15 MG PO TABS
15.0000 mg | ORAL_TABLET | Freq: Two times a day (BID) | ORAL | 0 refills | Status: DC
  Filled 2022-08-27 – 2022-10-17 (×3): qty 60, 30d supply, fill #0

## 2022-08-28 ENCOUNTER — Other Ambulatory Visit (HOSPITAL_COMMUNITY): Payer: Self-pay

## 2022-08-29 ENCOUNTER — Other Ambulatory Visit (HOSPITAL_COMMUNITY): Payer: Self-pay

## 2022-09-02 ENCOUNTER — Other Ambulatory Visit (HOSPITAL_COMMUNITY): Payer: Self-pay

## 2022-09-12 ENCOUNTER — Other Ambulatory Visit (HOSPITAL_COMMUNITY): Payer: Self-pay

## 2022-09-12 ENCOUNTER — Other Ambulatory Visit: Payer: Self-pay

## 2022-09-20 ENCOUNTER — Other Ambulatory Visit (HOSPITAL_COMMUNITY): Payer: Self-pay

## 2022-09-22 ENCOUNTER — Other Ambulatory Visit: Payer: Self-pay

## 2022-10-17 ENCOUNTER — Other Ambulatory Visit: Payer: Self-pay

## 2022-10-17 ENCOUNTER — Other Ambulatory Visit (HOSPITAL_COMMUNITY): Payer: Self-pay

## 2022-10-20 ENCOUNTER — Ambulatory Visit (HOSPITAL_BASED_OUTPATIENT_CLINIC_OR_DEPARTMENT_OTHER): Payer: Commercial Managed Care - PPO | Admitting: Obstetrics & Gynecology

## 2022-10-20 ENCOUNTER — Other Ambulatory Visit (HOSPITAL_BASED_OUTPATIENT_CLINIC_OR_DEPARTMENT_OTHER): Payer: Self-pay

## 2022-10-20 ENCOUNTER — Encounter (HOSPITAL_BASED_OUTPATIENT_CLINIC_OR_DEPARTMENT_OTHER): Payer: Self-pay | Admitting: Obstetrics & Gynecology

## 2022-10-20 VITALS — BP 150/80 | HR 101 | Ht 61.0 in | Wt 146.4 lb

## 2022-10-20 DIAGNOSIS — Z90710 Acquired absence of both cervix and uterus: Secondary | ICD-10-CM

## 2022-10-20 DIAGNOSIS — Z79890 Hormone replacement therapy (postmenopausal): Secondary | ICD-10-CM

## 2022-10-20 DIAGNOSIS — Z01419 Encounter for gynecological examination (general) (routine) without abnormal findings: Secondary | ICD-10-CM

## 2022-10-20 DIAGNOSIS — E785 Hyperlipidemia, unspecified: Secondary | ICD-10-CM

## 2022-10-20 DIAGNOSIS — Z113 Encounter for screening for infections with a predominantly sexual mode of transmission: Secondary | ICD-10-CM | POA: Diagnosis not present

## 2022-10-20 DIAGNOSIS — R030 Elevated blood-pressure reading, without diagnosis of hypertension: Secondary | ICD-10-CM

## 2022-10-20 MED ORDER — ESTRADIOL 0.5 MG PO TABS
ORAL_TABLET | Freq: Every day | ORAL | 4 refills | Status: DC
  Filled 2022-10-20: qty 90, fill #0

## 2022-10-20 MED ORDER — ESTRADIOL 0.5 MG PO TABS
ORAL_TABLET | Freq: Every day | ORAL | 4 refills | Status: DC
  Filled 2022-10-20 – 2023-02-07 (×2): qty 90, 90d supply, fill #0
  Filled 2023-05-26: qty 90, 90d supply, fill #1
  Filled 2023-08-23: qty 90, 90d supply, fill #2

## 2022-10-20 NOTE — Progress Notes (Signed)
52 y.o. G3P0202 Married [place] female here for annual exam.  Doing well.  Blood pressure is elevated today.  Denies vaginal bleeding.      Patient's last menstrual period was 04/04/2012.          Sexually active: Yes.    The current method of family planning is status post hysterectomy.    Exercising: No.   Smoker:  no  Health Maintenance: Pap:  not indicated History of abnormal Pap:  no MMG:  02/2022 Colonoscopy:  cologuard 10/2020 BMD:   guidelines reviewed Screening Labs: order Hep C and HIV today.  Does fasting lab work with PCP.   reports that she has never smoked. She has never used smokeless tobacco. She reports that she does not drink alcohol and does not use drugs.  Past Medical History:  Diagnosis Date   Adult ADHD    Allergy    Arthritis    hands   Asthma    ?, inhaler use as needed   Complication of anesthesia 1990   muscle relaxant caused lung to collapse   Difficult intubation 1990   Dyslipidemia    History of chicken pox    IBS (irritable bowel syndrome)    ?   Migraine     Past Surgical History:  Procedure Laterality Date   ABDOMINOPLASTY  05/04/2012   Procedure: ABDOMINOPLASTY;  Surgeon: Crissie Reese, MD;  Location: [facility];  Service: Plastics;  Laterality: N/A;   ANAL FISSURE REPAIR     age 50   EYE SURGERY Left    age 18   GYNECOLOGIC CRYOSURGERY     laparoscopic hysterectomy  05/04/2012   WISDOM TOOTH EXTRACTION      Current Outpatient Medications  Medication Sig Dispense Refill   amphetamine-dextroamphetamine (ADDERALL) 15 MG tablet Take 1 tablet by mouth 2 (two) times daily. 60 tablet 0   budesonide-formoterol (SYMBICORT) 80-4.5 MCG/ACT inhaler Inhale 2 puffs into the lungs 2 (two) times daily. 10.2 g 2   ketotifen (ZADITOR) 0.025 % ophthalmic solution Place 1 drop into both eyes 2 (two) times daily. 5 mL 0   levalbuterol (XOPENEX HFA) 45 MCG/ACT inhaler Inhale 1-2 puffs into the lungs every 6 (six) hours as needed for wheezing 15 g 3    levalbuterol (XOPENEX) 1.25 MG/3ML nebulizer solution Inhale 1 vial (1.25 mg) into the lungs using nebulizer every 4 (four) hours as needed for wheezing 75 mL 1   levocetirizine (XYZAL) 5 MG tablet Take 1 tablet (5 mg total) by mouth daily as needed. 90 tablet 0   mometasone (ELOCON) 0.1 % cream APPLY TO THE AFFECTED AREA(S) DAILY AS NEEDED 45 g 11   mometasone (NASONEX) 50 MCG/ACT nasal spray INSTILL 2 SPRAYS INTO NOSE ONCE DAILY AS DIRECTED. Please call [phone] for appt for refills 51 g 0   NEEDLE, DISP, 25 G (BD ECLIPSE NEEDLE) 25G X 5/8" MISC Use as directed. 10 each 5   Vitamin D, Ergocalciferol, (DRISDOL) 1.25 MG (50000 UNIT) CAPS capsule Take 1 capsule (50,000 Units total) by mouth once a week. 12 capsule 0   estradiol (ESTRACE) 0.5 MG tablet TAKE 1 TABLET BY MOUTH ONCE A DAY 90 tablet 4   No current facility-administered medications for this visit.    Family History  Problem Relation Age of Onset   Arthritis Mother    Hyperlipidemia Mother    Glaucoma Mother    Irritable bowel syndrome Mother    Cataracts Mother    Hyperlipidemia Father    Heart disease Father  3       CAD   Cataracts Father    Thyroid disease Sister    Hyperlipidemia Brother    Ulcerative colitis Brother     ROS: Constitutional: negative Genitourinary:negative  Exam:   BP (!) 150/80   Pulse (!) 101   Ht 5\' 1"  (1.549 m) Comment: Reported  Wt 146 lb 6.4 oz (66.4 kg)   LMP 04/04/2012   BMI 27.66 kg/m   Height: 5\' 1"  (154.9 cm) (Reported)  General appearance: alert, cooperative and appears stated age Head: Normocephalic, without obvious abnormality, atraumatic Neck: no adenopathy, supple, symmetrical, trachea midline and thyroid normal to inspection and palpation Lungs: clear to auscultation bilaterally Breasts: normal appearance, no masses or tenderness Heart: regular rate and rhythm Abdomen: soft, non-tender; bowel sounds normal; no masses,  no organomegaly Extremities: extremities normal,  atraumatic, no cyanosis or edema Skin: Skin color, texture, turgor normal. No rashes or lesions Lymph nodes: Cervical, supraclavicular, and axillary nodes normal. No abnormal inguinal nodes palpated Neurologic: Grossly normal   Pelvic: External genitalia:  no lesions              Urethra:  normal appearing urethra with no masses, tenderness or lesions              Bartholins and Skenes: normal                 Vagina: normal appearing vagina with normal color and no discharge, no lesions              Cervix: no lesions              Pap taken: No. Bimanual Exam:  Uterus:  uterus absent              Adnexa: no mass, fullness, tenderness               Rectovaginal: Confirms               Anus:  normal sphincter tone, no lesions  Chaperone, Octaviano Batty, CMA, was present for exam.  Assessment/Plan: 1. Well woman exam with routine gynecological exam - Pap smear not indciated - Mammogram 02/2022 - Colonoscopy declined.  Cologuard neg 10/2020.  Repeat next year. - Bone mineral density reviewed.  Will plan in later 10's - lab work done with PCP - vaccines reviewed/updated  2. H/O: hysterectomy  3. Elevated lipids - followed by Dr. Elease Hashimoto  4. Hormone replacement therapy - estradiol (ESTRACE) 0.5 MG tablet; TAKE 1 TABLET BY MOUTH ONCE A DAY  Dispense: 90 tablet; Refill: 4  5. Elevated BP -pt is going to monitor and home and follow up with Dr. Elease Hashimoto  6. Screening examination for STD (sexually transmitted disease) - HIV Antibody (routine testing w rflx) - Hepatitis C antibody

## 2022-10-21 LAB — HEPATITIS C ANTIBODY: Hep C Virus Ab: NONREACTIVE

## 2022-10-21 LAB — HIV ANTIBODY (ROUTINE TESTING W REFLEX): HIV Screen 4th Generation wRfx: NONREACTIVE

## 2022-11-11 ENCOUNTER — Other Ambulatory Visit (HOSPITAL_COMMUNITY): Payer: Self-pay

## 2022-11-11 ENCOUNTER — Encounter: Payer: Self-pay | Admitting: Family Medicine

## 2022-11-11 ENCOUNTER — Ambulatory Visit: Payer: Commercial Managed Care - PPO | Admitting: Family Medicine

## 2022-11-11 ENCOUNTER — Other Ambulatory Visit: Payer: Self-pay | Admitting: Family Medicine

## 2022-11-11 VITALS — BP 165/105 | HR 97 | Resp 12 | Ht 61.0 in | Wt 149.1 lb

## 2022-11-11 DIAGNOSIS — I10 Essential (primary) hypertension: Secondary | ICD-10-CM

## 2022-11-11 DIAGNOSIS — E559 Vitamin D deficiency, unspecified: Secondary | ICD-10-CM

## 2022-11-11 DIAGNOSIS — R002 Palpitations: Secondary | ICD-10-CM | POA: Diagnosis not present

## 2022-11-11 DIAGNOSIS — R7303 Prediabetes: Secondary | ICD-10-CM | POA: Diagnosis not present

## 2022-11-11 DIAGNOSIS — R0602 Shortness of breath: Secondary | ICD-10-CM | POA: Diagnosis not present

## 2022-11-11 MED ORDER — VITAMIN D (ERGOCALCIFEROL) 1.25 MG (50000 UNIT) PO CAPS
50000.0000 [IU] | ORAL_CAPSULE | ORAL | 0 refills | Status: DC
Start: 2022-11-11 — End: 2023-02-02
  Filled 2022-11-11: qty 12, 84d supply, fill #0

## 2022-11-11 MED ORDER — AMLODIPINE BESYLATE 5 MG PO TABS
5.0000 mg | ORAL_TABLET | Freq: Every day | ORAL | 2 refills | Status: DC
  Filled 2022-11-11: qty 30, 30d supply, fill #0
  Filled 2022-12-14: qty 30, 30d supply, fill #1
  Filled 2023-01-14: qty 30, 30d supply, fill #2

## 2022-11-11 NOTE — Progress Notes (Unsigned)
ACUTE VISIT Chief Complaint  Patient presents with   Hypertension   HPI: Marissa Horn is a 52 y.o. female with past medical history significant for asthma, IBS, vitamin D deficiency, and ADHD here today complaining of elevated BP readings. She is checking BP morning and night, BP 150-180's/104-113.  Hypertension This is a new problem. The current episode started more than 1 month ago. The problem has been gradually worsening since onset. The problem is uncontrolled. Associated symptoms include palpitations. Pertinent negatives include no blurred vision, chest pain, malaise/fatigue, neck pain, orthopnea or PND. There are no associated agents to hypertension. Risk factors for coronary artery disease include dyslipidemia. Past treatments include nothing.   She has been experiencing parietal headaches, which she has not had in many years. The headache pain is rated as 3 or 4 out of 10.  She also mentions occasional feeling like her heart is "racing", sleep issues due to anxiety, some difficulty breathing with exertion("out of shape"), with no associated diaphoresis, and a little bit of dizziness intermittently for the past few days. Negative for gross hematuria,foam in urine,of decreased urine output.  States that she has long hx of elevated HR.  She has not taken any new over-the-counter herbs or supplements and no new stressors. She admits that her salt intake has not been very good.  She has a family history of hypertension in her elderly parents.   She also mentions that in the past two weeks, she has been trying to lose weight in preparation for a trip but has gained one or two pounds instead. She believes she is experiencing fluid retention.  Lab Results  Component Value Date   CREATININE 0.79 08/06/2021   BUN 9 08/06/2021   NA 135 08/06/2021   K 3.6 08/06/2021   CL 101 08/06/2021   CO2 26 08/06/2021   Lab Results  Component Value Date   TSH 3.10 01/09/2021    She also would like to have HgA1C re-checked. Negative for polydipsia,polyuria, or polyphagia.  Lab Results  Component Value Date   HGBA1C 6.4 01/09/2021   Review of Systems  Constitutional:  Negative for activity change, appetite change, chills, fever and malaise/fatigue.  HENT:  Negative for nosebleeds and sore throat.   Eyes:  Negative for blurred vision, redness and visual disturbance.  Respiratory:  Negative for cough and wheezing.   Cardiovascular:  Positive for palpitations. Negative for chest pain, orthopnea and PND.  Gastrointestinal:  Negative for abdominal pain, nausea and vomiting.  Endocrine: Negative for cold intolerance and heat intolerance.  Musculoskeletal:  Negative for neck pain.  Neurological:  Negative for syncope and facial asymmetry.  See other pertinent positives and negatives in HPI.  Current Outpatient Medications on File Prior to Visit  Medication Sig Dispense Refill   amphetamine-dextroamphetamine (ADDERALL) 15 MG tablet Take 1 tablet by mouth 2 (two) times daily. 60 tablet 0   budesonide-formoterol (SYMBICORT) 80-4.5 MCG/ACT inhaler Inhale 2 puffs into the lungs 2 (two) times daily. 10.2 g 2   estradiol (ESTRACE) 0.5 MG tablet TAKE 1 TABLET BY MOUTH ONCE A DAY 90 tablet 4   ketotifen (ZADITOR) 0.025 % ophthalmic solution Place 1 drop into both eyes 2 (two) times daily. 5 mL 0   levalbuterol (XOPENEX HFA) 45 MCG/ACT inhaler Inhale 1-2 puffs into the lungs every 6 (six) hours as needed for wheezing 15 g 3   levalbuterol (XOPENEX) 1.25 MG/3ML nebulizer solution Inhale 1 vial (1.25 mg) into the lungs using nebulizer every 4 (four)  hours as needed for wheezing 75 mL 1   levocetirizine (XYZAL) 5 MG tablet Take 1 tablet (5 mg total) by mouth daily as needed. 90 tablet 0   mometasone (ELOCON) 0.1 % cream APPLY TO THE AFFECTED AREA(S) DAILY AS NEEDED 45 g 11   mometasone (NASONEX) 50 MCG/ACT nasal spray INSTILL 2 SPRAYS INTO NOSE ONCE DAILY AS DIRECTED. Please  call [phone] for appt for refills 51 g 0   NEEDLE, DISP, 25 G (BD ECLIPSE NEEDLE) 25G X 5/8" MISC Use as directed. 10 each 5   Vitamin D, Ergocalciferol, (DRISDOL) 1.25 MG (50000 UNIT) CAPS capsule Take 1 capsule (50,000 Units total) by mouth once a week. 12 capsule 0   No current facility-administered medications on file prior to visit.   Past Medical History:  Diagnosis Date   Adult ADHD    Allergy    Arthritis    hands   Asthma    ?, inhaler use as needed   Complication of anesthesia 1990   muscle relaxant caused lung to collapse   Difficult intubation 1990   Dyslipidemia    History of chicken pox    IBS (irritable bowel syndrome)    ?   Migraine    Allergies  Allergen Reactions   Scopolamine Anaphylaxis   Vyvanse [Lisdexamfetamine Dimesylate]     Severe migraine   Bactrim [Sulfamethoxazole-Trimethoprim]     Joint pain, fever   Biaxin [Clarithromycin] Nausea And Vomiting    Projectile vomiting   Tamiflu [Oseltamivir Phosphate] Nausea And Vomiting    Projectile vomiting   Cephalexin Nausea And Vomiting   Asa [Aspirin] Rash    Pt takes ibuprofen without problems    Social History   Socioeconomic History   Marital status: Married    Spouse name: Not on file   Number of children: 2   Years of education: Not on file   Highest education level: Not on file  Occupational History   Occupation: clinical dietitian  Tobacco Use   Smoking status: Never   Smokeless tobacco: Never  Vaping Use   Vaping Use: Never used  Substance and Sexual Activity   Alcohol use: No   Drug use: No   Sexual activity: Yes    Partners: Male    Birth control/protection: Surgical    Comment: hysterectomy  Other Topics Concern   Not on file  Social History Narrative   Married, 2 sons. Husband is a pediatrician.      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 Approximately one maximum caffeinated beverages daily.   Social Determinants of Health   Financial Resource  Strain: Not on file  Food Insecurity: Not on file  Transportation Needs: Not on file  Physical Activity: Not on file  Stress: Not on file  Social Connections: Not on file   Vitals:   11/11/22 1358 11/11/22 1435  BP: 136/84 (!) 165/105  Pulse: 97   Resp: 12   SpO2: 97%   Body mass index is 28.18 kg/m.  Physical Exam Vitals and nursing note reviewed.  Constitutional:      General: She is not in acute distress.    Appearance: She is well-developed.  HENT:     Head: Normocephalic and atraumatic.     Mouth/Throat:     Mouth: Mucous membranes are moist.     Pharynx: Oropharynx is clear.  Eyes:     Conjunctiva/sclera: Conjunctivae normal.  Cardiovascular:     Rate and Rhythm: Normal rate and regular rhythm.  Pulses:          Dorsalis pedis pulses are 2+ on the right side and 2+ on the left side.     Heart sounds: No murmur heard. Pulmonary:     Effort: Pulmonary effort is normal. No respiratory distress.     Breath sounds: Normal breath sounds.  Abdominal:     Palpations: Abdomen is soft. There is no hepatomegaly or mass.     Tenderness: There is no abdominal tenderness.  Musculoskeletal:     Right lower leg: No edema.     Left lower leg: No edema.  Lymphadenopathy:     Cervical: No cervical adenopathy.  Skin:    General: Skin is warm.     Findings: No erythema or rash.  Neurological:     General: No focal deficit present.     Mental Status: She is alert and oriented to person, place, and time.     Cranial Nerves: No cranial nerve deficit.     Gait: Gait normal.  Psychiatric:        Mood and Affect: Affect normal. Mood is anxious.   ASSESSMENT AND PLAN:  Marissa Horn was seen today for elevated BP. Lab Results  Component Value Date   HGBA1C 6.5 11/11/2022   Lab Results  Component Value Date   CREATININE 0.73 11/11/2022   BUN 10 11/11/2022   NA 135 11/11/2022   K 4.1 11/11/2022   CL 99 11/11/2022   CO2 26 11/11/2022   Lab Results  Component Value  Date   WBC 6.0 11/11/2022   HGB 12.4 11/11/2022   HCT 38.1 11/11/2022   MCV 84.5 11/11/2022   PLT 375.0 11/11/2022   Lab Results  Component Value Date   TSH 2.86 11/11/2022   Prediabetes Encouraged a healthy life style for diabetes prevention. Further recommendations according to HgA1C result.  -     Hemoglobin A1c; Future  Essential (primary) hypertension We discussed Dx criteria for HTN, treatment options,and some side effects. She is concerned about the possibility of potassium elevation with meds like ACEI's and ARB's. She agrees with starting Amlodipine 5 mg daily. DASH/low salt diet also recommended. Continue monitoring BP regularly. Instructed about warning signs. F/U in 5-6 weeks, before if needed.  -     Basic metabolic panel; Future -     TSH; Future -     CBC; Future -     amLODIPine Besylate; Take 1 tablet (5 mg total) by mouth daily.  Dispense: 30 tablet; Refill: 2  Palpitation Reporting long hx of elevated HR. I think we can hold on EKG today, has had some in the past. Adderall can be a contributing factor.  We discussed possible benefits of BB but before it is added, recommend monitoring HR at home and bring readings next visit. Instructed about warning signs.  -     TSH; Future -     CBC; Future  Short of breath on exertion  Asthma and allergies can be a contributing factor. Will hold on further work up for now. Instructed about warning signs.  Return in about 6 weeks (around 12/23/2022).  Marissa Demars G. [place], MD  [facility]. [place] office.

## 2022-11-11 NOTE — Patient Instructions (Addendum)
A few things to remember from today's visit:  Essential (primary) hypertension - Plan: Basic metabolic panel, TSH, CBC, amLODipine (NORVASC) 5 MG tablet  Palpitation  Prediabetes - Plan: Hemoglobin A1c Amlodipine 5 mg started today. Decrease salt intake. Continue monitoring blood pressure. F/U 4-5 weeks.  Please be sure medication list is accurate. If a new problem present, please set up appointment sooner than planned today.

## 2022-11-12 ENCOUNTER — Other Ambulatory Visit (HOSPITAL_COMMUNITY): Payer: Self-pay

## 2022-11-12 ENCOUNTER — Other Ambulatory Visit: Payer: Self-pay

## 2022-11-12 LAB — BASIC METABOLIC PANEL
BUN: 10 mg/dL (ref 6–23)
CO2: 26 mEq/L (ref 19–32)
Calcium: 9.7 mg/dL (ref 8.4–10.5)
Chloride: 99 mEq/L (ref 96–112)
Creatinine, Ser: 0.73 mg/dL (ref 0.40–1.20)
GFR: 95.6 mL/min (ref >60.00)
Glucose, Bld: 81 mg/dL (ref 70–99)
Potassium: 4.1 mEq/L (ref 3.5–5.1)
Sodium: 135 mEq/L (ref 135–145)

## 2022-11-12 LAB — CBC
HCT: 38.1 % (ref 36.0–46.0)
Hemoglobin: 12.4 g/dL (ref 12.0–15.0)
MCHC: 32.5 g/dL (ref 30.0–36.0)
MCV: 84.5 fl (ref 78.0–100.0)
Platelets: 375 10*3/uL (ref 150.0–400.0)
RBC: 4.51 Mil/uL (ref 3.87–5.11)
RDW: 14.5 % (ref 11.5–15.5)
WBC: 6 10*3/uL (ref 4.0–10.5)

## 2022-11-12 LAB — TSH: TSH: 2.86 u[IU]/mL (ref 0.35–5.50)

## 2022-11-12 LAB — HEMOGLOBIN A1C: Hgb A1c MFr Bld: 6.5 % (ref 4.6–6.5)

## 2022-11-13 ENCOUNTER — Encounter: Payer: Self-pay | Admitting: Family Medicine

## 2022-11-15 ENCOUNTER — Other Ambulatory Visit: Payer: Self-pay | Admitting: Family Medicine

## 2022-11-17 ENCOUNTER — Other Ambulatory Visit (HOSPITAL_COMMUNITY): Payer: Self-pay

## 2022-11-17 MED ORDER — AMPHETAMINE-DEXTROAMPHETAMINE 15 MG PO TABS
15.0000 mg | ORAL_TABLET | Freq: Two times a day (BID) | ORAL | 0 refills | Status: DC
  Filled 2022-11-17 – 2022-12-14 (×2): qty 60, 30d supply, fill #0

## 2022-11-27 ENCOUNTER — Other Ambulatory Visit (HOSPITAL_COMMUNITY): Payer: Self-pay

## 2022-11-29 ENCOUNTER — Other Ambulatory Visit (HOSPITAL_COMMUNITY): Payer: Self-pay

## 2022-12-15 ENCOUNTER — Other Ambulatory Visit: Payer: Self-pay

## 2023-01-14 ENCOUNTER — Other Ambulatory Visit: Payer: Self-pay | Admitting: Family Medicine

## 2023-01-14 ENCOUNTER — Ambulatory Visit: Payer: Commercial Managed Care - PPO | Admitting: Family Medicine

## 2023-01-14 ENCOUNTER — Other Ambulatory Visit (HOSPITAL_BASED_OUTPATIENT_CLINIC_OR_DEPARTMENT_OTHER): Payer: Self-pay

## 2023-01-14 VITALS — BP 118/76 | HR 82 | Temp 97.9°F | Ht 61.0 in | Wt 150.2 lb

## 2023-01-14 DIAGNOSIS — I10 Essential (primary) hypertension: Secondary | ICD-10-CM

## 2023-01-14 DIAGNOSIS — J4520 Mild intermittent asthma, uncomplicated: Secondary | ICD-10-CM

## 2023-01-14 DIAGNOSIS — E1165 Type 2 diabetes mellitus with hyperglycemia: Secondary | ICD-10-CM

## 2023-01-14 DIAGNOSIS — Z7985 Long-term (current) use of injectable non-insulin antidiabetic drugs: Secondary | ICD-10-CM

## 2023-01-14 MED ORDER — TIRZEPATIDE 2.5 MG/0.5ML ~~LOC~~ SOAJ
2.5000 mg | SUBCUTANEOUS | 1 refills | Status: DC
  Filled 2023-01-14: qty 2, 28d supply, fill #0

## 2023-01-14 MED ORDER — HYDROCHLOROTHIAZIDE 12.5 MG PO CAPS
12.5000 mg | ORAL_CAPSULE | Freq: Every day | ORAL | 1 refills | Status: DC | PRN
  Filled 2023-01-14: qty 30, 30d supply, fill #0

## 2023-01-14 MED ORDER — BUDESONIDE-FORMOTEROL FUMARATE 80-4.5 MCG/ACT IN AERO
2.0000 | INHALATION_SPRAY | Freq: Two times a day (BID) | RESPIRATORY_TRACT | 2 refills | Status: DC
Start: 2023-01-14 — End: 2023-02-02
  Filled 2023-01-14: qty 10.2, 30d supply, fill #0

## 2023-01-14 NOTE — Progress Notes (Signed)
Established Patient Office Visit  Subjective   Patient ID: Marissa Horn, female    DOB: January 19, 1971  Age: 52 y.o. MRN: [id number]  Chief Complaint  Patient presents with   Medical Management of Chronic Issues    HPI   Marissa Horn is seen today for follow-up regarding hypertension.  She was seen by one of my colleagues in my absence for elevated blood pressure of 165/105 and was started on amlodipine 5 mg daily.  She has had good improvement in blood pressure since then but feels like she is having some increased palpitations and increased heart rate especially at night.  Also feels like she may be retaining a little bit of fluid at times.  She does have some bilateral upper extremity tremor.  No known family history of essential tremor.  Tremor does not extinguish with movement.  No focal weakness.   No gait changes.    She has had some difficulty with weight gain and difficulty losing weight.  She previously had success with restricting calories but recently does not seem to have much success with that.  Recent A1c was stable at 6.5.  A1C has been as high as 6.8 in past.  She would like to consider possible GLP-1 medication options.  Past Medical History:  Diagnosis Date   Adult ADHD    Allergy    Arthritis    hands   Asthma    ?, inhaler use as needed   Complication of anesthesia 1990   muscle relaxant caused lung to collapse   Difficult intubation 1990   Dyslipidemia    History of chicken pox    IBS (irritable bowel syndrome)    ?   Migraine    Past Surgical History:  Procedure Laterality Date   ABDOMINOPLASTY  05/04/2012   Procedure: ABDOMINOPLASTY;  Surgeon: Etter Sjogren, MD;  Location: [facility];  Service: Plastics;  Laterality: N/A;   ANAL FISSURE REPAIR     age 75   EYE SURGERY Left    age 5   GYNECOLOGIC CRYOSURGERY     laparoscopic hysterectomy  05/04/2012   WISDOM TOOTH EXTRACTION      reports that she has never smoked. She has never used smokeless tobacco.  She reports that she does not drink alcohol and does not use drugs. family history includes Arthritis in her mother; Cataracts in her father and mother; Glaucoma in her mother; Heart disease (age of onset: 45) in her father; Hyperlipidemia in her brother, father, and mother; Irritable bowel syndrome in her mother; Thyroid disease in her sister; Ulcerative colitis in her brother. Allergies  Allergen Reactions   Scopolamine Anaphylaxis   Vyvanse [Lisdexamfetamine Dimesylate]     Severe migraine   Bactrim [Sulfamethoxazole-Trimethoprim]     Joint pain, fever   Biaxin [Clarithromycin] Nausea And Vomiting    Projectile vomiting   Tamiflu [Oseltamivir Phosphate] Nausea And Vomiting    Projectile vomiting   Cephalexin Nausea And Vomiting   Asa [Aspirin] Rash    Pt takes ibuprofen without problems    Review of Systems  Constitutional:  Negative for malaise/fatigue.  Eyes:  Negative for blurred vision.  Respiratory:  Negative for shortness of breath.   Cardiovascular:  Negative for chest pain.  Neurological:  Negative for dizziness, weakness and headaches.      Objective:     BP 118/76 (BP Location: Left Arm, Patient Position: Sitting, Cuff Size: Normal)   Pulse 82   Temp 97.9 F (36.6 C) (Oral)   Ht  5\' 1"  (1.549 m)   Wt 150 lb 3.2 oz (68.1 kg)   LMP 04/04/2012   SpO2 96%   BMI 28.38 kg/m  BP Readings from Last 3 Encounters:  01/14/23 118/76  11/11/22 (!) 165/105  10/20/22 (!) 150/80   Wt Readings from Last 3 Encounters:  01/14/23 150 lb 3.2 oz (68.1 kg)  11/11/22 149 lb 2 oz (67.6 kg)  10/20/22 146 lb 6.4 oz (66.4 kg)      Physical Exam Vitals reviewed.  Constitutional:      Appearance: She is well-developed.  Eyes:     Pupils: Pupils are equal, round, and reactive to light.  Neck:     Thyroid: No thyromegaly.     Vascular: No JVD.  Cardiovascular:     Rate and Rhythm: Normal rate and regular rhythm.     Heart sounds:     No gallop.  Pulmonary:     Effort:  Pulmonary effort is normal. No respiratory distress.     Breath sounds: Normal breath sounds. No wheezing or rales.  Musculoskeletal:     Cervical back: Neck supple.     Right lower leg: No edema.     Left lower leg: No edema.  Neurological:     Mental Status: She is alert.      No results found for any visits on 01/14/23.  Last CBC Lab Results  Component Value Date   WBC 6.0 11/11/2022   HGB 12.4 11/11/2022   HCT 38.1 11/11/2022   MCV 84.5 11/11/2022   MCH 27.6 06/02/2016   RDW 14.5 11/11/2022   PLT 375.0 11/11/2022   Last metabolic panel Lab Results  Component Value Date   GLUCOSE 81 11/11/2022   NA 135 11/11/2022   K 4.1 11/11/2022   CL 99 11/11/2022   CO2 26 11/11/2022   BUN 10 11/11/2022   CREATININE 0.73 11/11/2022   CALCIUM 9.7 11/11/2022   PROT 7.0 08/06/2021   ALBUMIN 4.0 08/06/2021   BILITOT 0.8 08/06/2021   ALKPHOS 52 08/06/2021   AST 23 08/06/2021   ALT 51 (H) 08/06/2021   Last lipids Lab Results  Component Value Date   CHOL 298 (H) 01/09/2021   HDL 70.40 01/09/2021   LDLCALC 208 (H) 01/09/2021   LDLDIRECT 144.0 09/02/2017   TRIG 99.0 01/09/2021   CHOLHDL 4 01/09/2021   Last hemoglobin A1c Lab Results  Component Value Date   HGBA1C 6.5 11/11/2022      The 10-year ASCVD risk score (Arnett DK, et al., 2019) is: 1.9%    Assessment & Plan:   #1 hypertension greatly improved with recent addition of amlodipine.  However, she does have some tendency toward fluid retention and also feels like she may be having increased heart rate at times and palpitations especially at night.  We explained that this could be possibly related to the vasodilation effects of amlodipine.  -Discussed importance of sodium reduction and weight loss along with regular aerobic exercise -We did have a long discussion regarding other potential medication options.  Could consider change to diltiazem which might produce less tachycardia.  We also discussed possible  thiazide.  She mentioned beta-blocker and we explained this would probably not be as effective at lowering her blood pressure but might certainly help her tremor. -We elected to continue course with amlodipine for now. -She did inquire regarding possible low-dose diuretic to take only very infrequently for fluid retention.  We agreed to low-dose HCTZ 12.5 mg once daily only as needed for  edema but if she continues to have fluid retention concerns consider change in blood pressure medication  #2 type 2 diabetes currently stable with A1c 6.5%.  She had great difficulty with weight loss.  She specifically would like to consider GLP-1 medications.  We discussed options including Ozempic and Mounjaro.  She does not have any contraindications such as pancreatitis or family history of medullary thyroid cancer.  Discussed potential side effects.  Start Mounjaro 2.5 mg subcutaneous once weekly.  Set up follow-up within 1 month to reassess.  Consider further titration at that time if tolerating well.  Return in about 1 month (around 02/13/2023).    Evelena Peat, MD

## 2023-01-15 ENCOUNTER — Other Ambulatory Visit (HOSPITAL_BASED_OUTPATIENT_CLINIC_OR_DEPARTMENT_OTHER): Payer: Self-pay

## 2023-01-15 DIAGNOSIS — E1165 Type 2 diabetes mellitus with hyperglycemia: Secondary | ICD-10-CM | POA: Insufficient documentation

## 2023-01-16 ENCOUNTER — Other Ambulatory Visit (HOSPITAL_BASED_OUTPATIENT_CLINIC_OR_DEPARTMENT_OTHER): Payer: Self-pay

## 2023-01-17 ENCOUNTER — Other Ambulatory Visit (HOSPITAL_COMMUNITY): Payer: Self-pay

## 2023-01-19 ENCOUNTER — Other Ambulatory Visit (HOSPITAL_BASED_OUTPATIENT_CLINIC_OR_DEPARTMENT_OTHER): Payer: Self-pay

## 2023-01-19 ENCOUNTER — Encounter: Payer: Self-pay | Admitting: Family Medicine

## 2023-01-20 ENCOUNTER — Other Ambulatory Visit (HOSPITAL_BASED_OUTPATIENT_CLINIC_OR_DEPARTMENT_OTHER): Payer: Self-pay

## 2023-01-21 ENCOUNTER — Other Ambulatory Visit (HOSPITAL_COMMUNITY): Payer: Self-pay

## 2023-01-21 MED ORDER — TIRZEPATIDE 2.5 MG/0.5ML ~~LOC~~ SOAJ
2.5000 mg | SUBCUTANEOUS | 1 refills | Status: DC
  Filled 2023-01-21 – 2023-01-22 (×3): qty 2, 28d supply, fill #0
  Filled 2023-01-23 – 2023-02-14 (×6): qty 2, 28d supply, fill #1
  Filled ????-??-?? (×2): fill #1

## 2023-01-21 NOTE — Telephone Encounter (Signed)
Noted  

## 2023-01-22 ENCOUNTER — Other Ambulatory Visit (HOSPITAL_BASED_OUTPATIENT_CLINIC_OR_DEPARTMENT_OTHER): Payer: Self-pay

## 2023-01-22 ENCOUNTER — Other Ambulatory Visit (HOSPITAL_COMMUNITY): Payer: Self-pay

## 2023-01-23 ENCOUNTER — Other Ambulatory Visit (HOSPITAL_COMMUNITY): Payer: Self-pay

## 2023-01-23 ENCOUNTER — Other Ambulatory Visit (HOSPITAL_BASED_OUTPATIENT_CLINIC_OR_DEPARTMENT_OTHER): Payer: Self-pay

## 2023-01-26 ENCOUNTER — Other Ambulatory Visit: Payer: Self-pay | Admitting: Family

## 2023-01-26 DIAGNOSIS — E559 Vitamin D deficiency, unspecified: Secondary | ICD-10-CM

## 2023-01-28 ENCOUNTER — Other Ambulatory Visit (HOSPITAL_COMMUNITY): Payer: Self-pay

## 2023-01-29 ENCOUNTER — Other Ambulatory Visit (HOSPITAL_BASED_OUTPATIENT_CLINIC_OR_DEPARTMENT_OTHER): Payer: Self-pay

## 2023-02-02 ENCOUNTER — Other Ambulatory Visit (HOSPITAL_BASED_OUTPATIENT_CLINIC_OR_DEPARTMENT_OTHER): Payer: Self-pay

## 2023-02-02 ENCOUNTER — Encounter: Payer: Self-pay | Admitting: Family Medicine

## 2023-02-02 ENCOUNTER — Ambulatory Visit: Payer: Commercial Managed Care - PPO | Admitting: Family Medicine

## 2023-02-02 ENCOUNTER — Ambulatory Visit (INDEPENDENT_AMBULATORY_CARE_PROVIDER_SITE_OTHER): Payer: Commercial Managed Care - PPO | Admitting: Family Medicine

## 2023-02-02 VITALS — BP 120/80 | HR 75 | Temp 97.7°F | Ht 59.45 in | Wt 144.2 lb

## 2023-02-02 DIAGNOSIS — E559 Vitamin D deficiency, unspecified: Secondary | ICD-10-CM

## 2023-02-02 DIAGNOSIS — I10 Essential (primary) hypertension: Secondary | ICD-10-CM

## 2023-02-02 DIAGNOSIS — E1165 Type 2 diabetes mellitus with hyperglycemia: Secondary | ICD-10-CM

## 2023-02-02 DIAGNOSIS — J4520 Mild intermittent asthma, uncomplicated: Secondary | ICD-10-CM

## 2023-02-02 DIAGNOSIS — Z7985 Long-term (current) use of injectable non-insulin antidiabetic drugs: Secondary | ICD-10-CM | POA: Diagnosis not present

## 2023-02-02 DIAGNOSIS — Z789 Other specified health status: Secondary | ICD-10-CM

## 2023-02-02 DIAGNOSIS — Z0000 Encounter for general adult medical examination without abnormal findings: Secondary | ICD-10-CM

## 2023-02-02 DIAGNOSIS — E538 Deficiency of other specified B group vitamins: Secondary | ICD-10-CM

## 2023-02-02 MED ORDER — BUDESONIDE-FORMOTEROL FUMARATE 80-4.5 MCG/ACT IN AERO
2.0000 | INHALATION_SPRAY | Freq: Two times a day (BID) | RESPIRATORY_TRACT | 2 refills | Status: DC
Start: 2023-02-02 — End: 2023-07-23
  Filled 2023-02-02 – 2023-04-07 (×2): qty 10.2, 30d supply, fill #0
  Filled 2023-05-04: qty 10.2, 30d supply, fill #1
  Filled 2023-06-12 – 2023-06-26 (×2): qty 10.2, 30d supply, fill #2

## 2023-02-02 MED ORDER — AMLODIPINE BESYLATE 5 MG PO TABS
5.0000 mg | ORAL_TABLET | Freq: Every day | ORAL | 3 refills | Status: DC
Start: 2023-02-02 — End: 2024-02-24
  Filled 2023-02-02 – 2023-02-07 (×2): qty 90, 90d supply, fill #0
  Filled 2023-05-26: qty 90, 90d supply, fill #1
  Filled 2023-08-23: qty 90, 90d supply, fill #2
  Filled 2023-11-28: qty 90, 90d supply, fill #3

## 2023-02-02 MED ORDER — VITAMIN D (ERGOCALCIFEROL) 1.25 MG (50000 UNIT) PO CAPS
50000.0000 [IU] | ORAL_CAPSULE | ORAL | 3 refills | Status: DC
Start: 2023-02-02 — End: 2023-11-28
  Filled 2023-02-02: qty 12, 84d supply, fill #0
  Filled 2023-05-26: qty 12, 84d supply, fill #1
  Filled 2023-06-12 – 2023-08-23 (×2): qty 12, 84d supply, fill #2
  Filled 2023-09-26 – 2023-10-29 (×3): qty 12, 84d supply, fill #3

## 2023-02-02 MED ORDER — AMPHETAMINE-DEXTROAMPHETAMINE 15 MG PO TABS
15.0000 mg | ORAL_TABLET | Freq: Two times a day (BID) | ORAL | 0 refills | Status: DC
  Filled 2023-02-02: qty 60, 30d supply, fill #0

## 2023-02-02 MED ORDER — HYDROCHLOROTHIAZIDE 12.5 MG PO CAPS
12.5000 mg | ORAL_CAPSULE | Freq: Every day | ORAL | 1 refills | Status: DC | PRN
  Filled 2023-02-02: qty 30, 30d supply, fill #0

## 2023-02-02 MED ORDER — ONDANSETRON HCL 4 MG PO TABS
4.0000 mg | ORAL_TABLET | Freq: Three times a day (TID) | ORAL | 0 refills | Status: DC | PRN
  Filled 2023-02-02: qty 20, 7d supply, fill #0

## 2023-02-02 MED ORDER — LEVALBUTEROL TARTRATE 45 MCG/ACT IN AERO
1.0000 | INHALATION_SPRAY | Freq: Four times a day (QID) | RESPIRATORY_TRACT | 1 refills | Status: DC | PRN
Start: 2023-02-02 — End: 2023-06-12
  Filled 2023-02-02: qty 15, 25d supply, fill #0
  Filled 2023-05-04: qty 15, 25d supply, fill #1

## 2023-02-02 NOTE — Progress Notes (Unsigned)
Established Patient Office Visit  Subjective   Patient ID: Marissa Horn Horn, female    DOB: 1971/08/02  Age: 52 y.o. MRN: [id number]  Chief Complaint  Patient presents with   Annual Exam    HPI  {History (Optional):23778} Marissa Horn seen for physical exam.  She has past medical history significant for hypertension, asthma, IBS, type 2 diabetes, ADD, hyperlipidemia, B12 deficiency, and vitamin D deficiency.  We recently had prescribed Mounjaro and she just started 2.5 mg subcutaneous once weekly.  She is having some mild nausea but tolerating overall fairly well.  She has already lost about 4 pounds.  Health maintenance reviewed  Health Maintenance  Topic Date Due   FOOT EXAM  Never done   Diabetic kidney evaluation - Urine ACR  Never done   Zoster Vaccines- Shingrix (1 of 2) Never done   COVID-19 Vaccine (5 - 2023-24 season) 04/04/2022   OPHTHALMOLOGY EXAM  10/01/2022   Fecal DNA (Cologuard)  10/03/2023 (Originally 01/04/2017)   INFLUENZA VACCINE  03/05/2023   DTaP/Tdap/Td (2 - Td or Tdap) 04/27/2023   HEMOGLOBIN A1C  05/13/2023   Diabetic kidney evaluation - eGFR measurement  11/11/2023   MAMMOGRAM  02/18/2024   Hepatitis C Screening  Completed   HIV Screening  Completed   HPV VACCINES  Aged Out   PAP SMEAR-Modifier  Discontinued  She has had previous hysterectomy for benign disease so no indication for further Pap smears -We discussed Shingrix and Tdap and she would like to return later for those -Cologuard was completed couple years ago and will be due for repeat 2025 -She is in process of setting up repeat mammogram and she is getting these yearly. -Recent hepatitis C screen negative  Social history-she is married and has no history of smoking or regular alcohol use.  Her husband is a Optometrist.  She has a son who attends [employer] and is premed.  Family history-both parents with history of hyperlipidemia.  Father had coronary artery disease around age 43.   Brother with hyperlipidemia.  No family history of cancer.  Past Medical History:  Diagnosis Date   Adult ADHD    Allergy    Arthritis    hands   Asthma    ?, inhaler use as needed   Complication of anesthesia 1990   muscle relaxant caused lung to collapse   Difficult intubation 1990   Dyslipidemia    History of chicken pox    IBS (irritable bowel syndrome)    ?   Migraine    Past Surgical History:  Procedure Laterality Date   ABDOMINOPLASTY  05/04/2012   Procedure: ABDOMINOPLASTY;  Surgeon: Etter Sjogren, MD;  Location: [facility];  Service: Plastics;  Laterality: N/A;   ANAL FISSURE REPAIR     age 28   EYE SURGERY Left    age 5   GYNECOLOGIC CRYOSURGERY     laparoscopic hysterectomy  05/04/2012   WISDOM TOOTH EXTRACTION      reports that she has never smoked. She has never used smokeless tobacco. She reports that she does not drink alcohol and does not use drugs. family history includes Arthritis in her mother; Cataracts in her father and mother; Glaucoma in her mother; Heart disease (age of onset: 45) in her father; Hyperlipidemia in her brother, father, and mother; Irritable bowel syndrome in her mother; Thyroid disease in her sister; Ulcerative colitis in her brother. Allergies  Allergen Reactions   Scopolamine Anaphylaxis   Vyvanse [Lisdexamfetamine Dimesylate]  Severe migraine   Bactrim [Sulfamethoxazole-Trimethoprim]     Joint pain, fever   Biaxin [Clarithromycin] Nausea And Vomiting    Projectile vomiting   Tamiflu [Oseltamivir Phosphate] Nausea And Vomiting    Projectile vomiting   Cephalexin Nausea And Vomiting   Asa [Aspirin] Rash    Pt takes ibuprofen without problems    Review of Systems  Constitutional:  Negative for chills, fever, malaise/fatigue and weight loss.  HENT:  Negative for hearing loss.   Eyes:  Negative for blurred vision and double vision.  Respiratory:  Negative for cough and shortness of breath.   Cardiovascular:  Negative for chest  pain, palpitations and leg swelling.  Gastrointestinal:  Negative for abdominal pain, blood in stool, constipation and diarrhea.  Genitourinary:  Negative for dysuria.  Skin:  Negative for rash.  Neurological:  Negative for dizziness, speech change, seizures, loss of consciousness and headaches.  Psychiatric/Behavioral:  Negative for depression.       Objective:     BP 120/80 (BP Location: Left Arm, Patient Position: Sitting, Cuff Size: Normal)   Pulse 75   Temp 97.7 F (36.5 C) (Oral)   Ht 4' 11.45" (1.51 m)   Wt 144 lb 3.2 oz (65.4 kg)   LMP 04/04/2012   SpO2 97%   BMI 28.69 kg/m  {Vitals History (Optional):23777}  Physical Exam Vitals reviewed.  Constitutional:      General: She is not in acute distress.    Appearance: She is well-developed. She is not ill-appearing.  HENT:     Head: Normocephalic and atraumatic.     Mouth/Throat:     Mouth: Mucous membranes are moist.     Pharynx: Oropharynx is clear.  Eyes:     Pupils: Pupils are equal, round, and reactive to light.  Neck:     Thyroid: No thyromegaly.  Cardiovascular:     Rate and Rhythm: Normal rate and regular rhythm.     Heart sounds: Normal heart sounds. No murmur heard. Pulmonary:     Effort: No respiratory distress.     Breath sounds: Normal breath sounds. No wheezing or rales.  Abdominal:     General: Bowel sounds are normal. There is no distension.     Palpations: Abdomen is soft. There is no mass.     Tenderness: There is no abdominal tenderness. There is no guarding or rebound.  Musculoskeletal:     Cervical back: Normal range of motion and neck supple.     Right lower leg: No edema.     Left lower leg: No edema.  Lymphadenopathy:     Cervical: No cervical adenopathy.  Skin:    Findings: No rash.  Neurological:     Mental Status: She is alert and oriented to person, place, and time.     Cranial Nerves: No cranial nerve deficit.  Psychiatric:        Behavior: Behavior normal.        Thought  Content: Thought content normal.        Judgment: Judgment normal.      No results found for any visits on 02/02/23.  {Labs (Optional):23779}  The 10-year ASCVD risk score (Arnett DK, et al., 2019) is: 3.8%    Assessment & Plan:   Problem List Items Addressed This Visit       Unprioritized   Type 2 diabetes mellitus with hyperglycemia (HCC) - Primary   Relevant Orders   Microalbumin / creatinine urine ratio   Hemoglobin A1c   Essential (primary) hypertension  Relevant Medications   amLODipine (NORVASC) 5 MG tablet   hydrochlorothiazide (MICROZIDE) 12.5 MG capsule   Vitamin D deficiency   Relevant Orders   VITAMIN D 25 Hydroxy (Vit-D Deficiency, Fractures)   Vitamin B 12 deficiency   Relevant Orders   Vitamin B12   Other Visit Diagnoses     Vegetarian       Relevant Orders   Zinc   Physical exam       Relevant Orders   Lipid panel   Basic metabolic panel   Hepatic function panel       Return in about 3 months (around 05/05/2023).    Evelena Peat, MD

## 2023-02-02 NOTE — Patient Instructions (Signed)
Remember to get Shingrix and Tdap sometime soon  Give me some feedback in 2-3 weeks regarding Mounjaro dose.

## 2023-02-03 ENCOUNTER — Other Ambulatory Visit: Payer: Self-pay | Admitting: Obstetrics & Gynecology

## 2023-02-03 ENCOUNTER — Other Ambulatory Visit (HOSPITAL_BASED_OUTPATIENT_CLINIC_OR_DEPARTMENT_OTHER): Payer: Self-pay

## 2023-02-03 ENCOUNTER — Other Ambulatory Visit (INDEPENDENT_AMBULATORY_CARE_PROVIDER_SITE_OTHER): Payer: Commercial Managed Care - PPO

## 2023-02-03 DIAGNOSIS — Z789 Other specified health status: Secondary | ICD-10-CM

## 2023-02-03 DIAGNOSIS — E559 Vitamin D deficiency, unspecified: Secondary | ICD-10-CM | POA: Diagnosis not present

## 2023-02-03 DIAGNOSIS — E538 Deficiency of other specified B group vitamins: Secondary | ICD-10-CM | POA: Diagnosis not present

## 2023-02-03 DIAGNOSIS — Z0000 Encounter for general adult medical examination without abnormal findings: Secondary | ICD-10-CM | POA: Diagnosis not present

## 2023-02-03 DIAGNOSIS — E1165 Type 2 diabetes mellitus with hyperglycemia: Secondary | ICD-10-CM

## 2023-02-03 LAB — HEPATIC FUNCTION PANEL
ALT: 17 U/L (ref 0–35)
AST: 18 U/L (ref 0–37)
Albumin: 4.3 g/dL (ref 3.5–5.2)
Alkaline Phosphatase: 62 U/L (ref 39–117)
Bilirubin, Direct: 0.1 mg/dL (ref 0.0–0.3)
Total Bilirubin: 1 mg/dL (ref 0.2–1.2)
Total Protein: 7.5 g/dL (ref 6.0–8.3)

## 2023-02-03 LAB — BASIC METABOLIC PANEL
BUN: 9 mg/dL (ref 6–23)
CO2: 26 mEq/L (ref 19–32)
Calcium: 9.5 mg/dL (ref 8.4–10.5)
Chloride: 100 mEq/L (ref 96–112)
Creatinine, Ser: 0.91 mg/dL (ref 0.40–1.20)
GFR: 73.27 mL/min (ref >60.00)
Glucose, Bld: 92 mg/dL (ref 70–99)
Potassium: 3.9 mEq/L (ref 3.5–5.1)
Sodium: 134 mEq/L — ABNORMAL LOW (ref 135–145)

## 2023-02-03 LAB — VITAMIN B12: Vitamin B-12: 499 pg/mL (ref 211–911)

## 2023-02-03 LAB — LIPID PANEL
Cholesterol: 292 mg/dL — ABNORMAL HIGH (ref 0–200)
HDL: 59.5 mg/dL (ref >39.00)
LDL Cholesterol: 197 mg/dL — ABNORMAL HIGH (ref 0–99)
NonHDL: 232.33
Total CHOL/HDL Ratio: 5
Triglycerides: 175 mg/dL — ABNORMAL HIGH (ref 0.0–149.0)
VLDL: 35 mg/dL (ref 0.0–40.0)

## 2023-02-03 LAB — VITAMIN D 25 HYDROXY (VIT D DEFICIENCY, FRACTURES): VITD: 62.36 ng/mL (ref 30.00–100.00)

## 2023-02-03 LAB — HEMOGLOBIN A1C: Hgb A1c MFr Bld: 6.5 % (ref 4.6–6.5)

## 2023-02-04 LAB — MICROALBUMIN / CREATININE URINE RATIO
Creatinine,U: 210.9 mg/dL
Microalb Creat Ratio: 0.3 mg/g (ref 0.0–30.0)
Microalb, Ur: <0.7 mg/dL (ref 0.0–1.9)

## 2023-02-06 ENCOUNTER — Encounter: Payer: Commercial Managed Care - PPO | Admitting: Family Medicine

## 2023-02-07 ENCOUNTER — Other Ambulatory Visit (HOSPITAL_BASED_OUTPATIENT_CLINIC_OR_DEPARTMENT_OTHER): Payer: Self-pay

## 2023-02-07 LAB — ZINC: Zinc: 61 ug/dL (ref 60–130)

## 2023-02-13 ENCOUNTER — Other Ambulatory Visit (HOSPITAL_BASED_OUTPATIENT_CLINIC_OR_DEPARTMENT_OTHER): Payer: Self-pay

## 2023-02-14 ENCOUNTER — Other Ambulatory Visit (HOSPITAL_BASED_OUTPATIENT_CLINIC_OR_DEPARTMENT_OTHER): Payer: Self-pay

## 2023-02-19 ENCOUNTER — Other Ambulatory Visit (HOSPITAL_BASED_OUTPATIENT_CLINIC_OR_DEPARTMENT_OTHER): Payer: Self-pay

## 2023-02-22 ENCOUNTER — Encounter: Payer: Self-pay | Admitting: Family Medicine

## 2023-02-23 ENCOUNTER — Other Ambulatory Visit (HOSPITAL_COMMUNITY): Payer: Self-pay

## 2023-02-23 MED ORDER — TIRZEPATIDE 5 MG/0.5ML ~~LOC~~ SOAJ
5.0000 mg | SUBCUTANEOUS | 0 refills | Status: DC
  Filled 2023-02-23 – 2023-03-09 (×3): qty 2, 28d supply, fill #0

## 2023-02-24 ENCOUNTER — Ambulatory Visit
Admission: RE | Admit: 2023-02-24 | Discharge: 2023-02-24 | Disposition: A | Discharge status: Home / Self Care | Payer: Commercial Managed Care - PPO | Source: Ambulatory Visit | Attending: Obstetrics & Gynecology | Admitting: Obstetrics & Gynecology

## 2023-02-24 DIAGNOSIS — Z1231 Encounter for screening mammogram for malignant neoplasm of breast: Secondary | ICD-10-CM | POA: Diagnosis not present

## 2023-02-24 DIAGNOSIS — Z0000 Encounter for general adult medical examination without abnormal findings: Secondary | ICD-10-CM

## 2023-03-07 ENCOUNTER — Other Ambulatory Visit (HOSPITAL_BASED_OUTPATIENT_CLINIC_OR_DEPARTMENT_OTHER): Payer: Self-pay

## 2023-03-08 ENCOUNTER — Other Ambulatory Visit (HOSPITAL_BASED_OUTPATIENT_CLINIC_OR_DEPARTMENT_OTHER): Payer: Self-pay

## 2023-03-09 ENCOUNTER — Other Ambulatory Visit (HOSPITAL_BASED_OUTPATIENT_CLINIC_OR_DEPARTMENT_OTHER): Payer: Self-pay

## 2023-03-13 ENCOUNTER — Other Ambulatory Visit (HOSPITAL_BASED_OUTPATIENT_CLINIC_OR_DEPARTMENT_OTHER): Payer: Self-pay

## 2023-04-07 ENCOUNTER — Encounter: Payer: Self-pay | Admitting: Family Medicine

## 2023-04-07 ENCOUNTER — Other Ambulatory Visit: Payer: Self-pay | Admitting: Family Medicine

## 2023-04-07 ENCOUNTER — Other Ambulatory Visit (HOSPITAL_BASED_OUTPATIENT_CLINIC_OR_DEPARTMENT_OTHER): Payer: Self-pay

## 2023-04-07 ENCOUNTER — Ambulatory Visit: Payer: Commercial Managed Care - PPO | Admitting: Family Medicine

## 2023-04-07 VITALS — BP 100/72 | HR 90 | Temp 97.9°F | Ht 59.45 in | Wt 132.0 lb

## 2023-04-07 DIAGNOSIS — Z7985 Long-term (current) use of injectable non-insulin antidiabetic drugs: Secondary | ICD-10-CM

## 2023-04-07 DIAGNOSIS — E785 Hyperlipidemia, unspecified: Secondary | ICD-10-CM | POA: Diagnosis not present

## 2023-04-07 DIAGNOSIS — E1165 Type 2 diabetes mellitus with hyperglycemia: Secondary | ICD-10-CM

## 2023-04-07 DIAGNOSIS — I10 Essential (primary) hypertension: Secondary | ICD-10-CM | POA: Diagnosis not present

## 2023-04-07 MED ORDER — TIRZEPATIDE 2.5 MG/0.5ML ~~LOC~~ SOAJ
2.5000 mg | SUBCUTANEOUS | 3 refills | Status: DC
  Filled 2023-04-07: qty 2, 28d supply, fill #0
  Filled 2023-05-04: qty 2, 28d supply, fill #1
  Filled 2023-05-26 – 2023-05-27 (×2): qty 2, 28d supply, fill #2

## 2023-04-07 NOTE — Progress Notes (Signed)
Established Patient Office Visit  Subjective   Patient ID: Marissa Horn, female    DOB: May 22, 1971  Age: 52 y.o. MRN: [id number]  Chief Complaint  Patient presents with   Medical Management of Chronic Issues    HPI   Marissa Horn is seen for medical follow-up.  She has history of hypertension, asthma, type 2 diabetes.  Her most recent A1c was 6.5%.  We initiated [facility] and she is currently on 5 mg daily.  She has had tremendous reduction in appetite.  She has had very good success with weight loss and has lost about 12 pounds since last visit but she feels like the current dose of 5 mg may be suppressing her appetite too much.  She feels like she has had some decreased energy which she thinks is reflecting inadequate energy intake.  Very mild but tolerable nausea for couple days after taking her dose.  She does note less brain fog since starting [facility] and losing some weight.  Blood pressure is very well-controlled today on amlodipine 5 mg daily.  Denies any orthostatic symptoms.  She had very high lipids recently with total cholesterol 292, triglycerides 175, LDL 197, HDL 59.  She declines statin use and prefers to recheck after starting Mounjaro.  Fortunately, she had coronary calcium score of 0 couple years ago.  Denies any recent chest pain.  Past Medical History:  Diagnosis Date   Adult ADHD    Allergy    Arthritis    hands   Asthma    ?, inhaler use as needed   Complication of anesthesia 1990   muscle relaxant caused lung to collapse   Difficult intubation 1990   Dyslipidemia    History of chicken pox    IBS (irritable bowel syndrome)    ?   Migraine    Past Surgical History:  Procedure Laterality Date   ABDOMINOPLASTY  05/04/2012   Procedure: ABDOMINOPLASTY;  Surgeon: Etter Sjogren, MD;  Location: [facility];  Service: Plastics;  Laterality: N/A;   ANAL FISSURE REPAIR     age 54   EYE SURGERY Left    age 77   GYNECOLOGIC CRYOSURGERY     laparoscopic hysterectomy   05/04/2012   WISDOM TOOTH EXTRACTION      reports that she has never smoked. She has never used smokeless tobacco. She reports that she does not drink alcohol and does not use drugs. family history includes Arthritis in her mother; Cataracts in her father and mother; Glaucoma in her mother; Heart disease (age of onset: 54) in her father; Hyperlipidemia in her brother, father, and mother; Irritable bowel syndrome in her mother; Thyroid disease in her sister; Ulcerative colitis in her brother. Allergies  Allergen Reactions   Scopolamine Anaphylaxis   Vyvanse [Lisdexamfetamine Dimesylate]     Severe migraine   Bactrim [Sulfamethoxazole-Trimethoprim]     Joint pain, fever   Biaxin [Clarithromycin] Nausea And Vomiting    Projectile vomiting   Tamiflu [Oseltamivir Phosphate] Nausea And Vomiting    Projectile vomiting   Cephalexin Nausea And Vomiting   Asa [Aspirin] Rash    Pt takes ibuprofen without problems    Review of Systems  Constitutional:  Negative for chills and fever.  Eyes:  Negative for blurred vision.  Respiratory:  Negative for shortness of breath.   Cardiovascular:  Negative for chest pain.  Neurological:  Negative for dizziness, weakness and headaches.      Objective:     BP 100/72 (BP Location: Left Arm, Patient  Position: Sitting, Cuff Size: Normal)   Pulse 90   Temp 97.9 F (36.6 C) (Oral)   Ht 4' 11.45" (1.51 m)   Wt 132 lb (59.9 kg)   LMP 04/04/2012   SpO2 98%   BMI 26.26 kg/m  BP Readings from Last 3 Encounters:  04/07/23 100/72  02/02/23 120/80  01/14/23 118/76   Wt Readings from Last 3 Encounters:  04/07/23 132 lb (59.9 kg)  02/02/23 144 lb 3.2 oz (65.4 kg)  01/14/23 150 lb 3.2 oz (68.1 kg)      Physical Exam Vitals reviewed.  Constitutional:      General: She is not in acute distress.    Appearance: Normal appearance.  Cardiovascular:     Rate and Rhythm: Normal rate and regular rhythm.     Heart sounds: No murmur heard. Pulmonary:      Effort: Pulmonary effort is normal.     Breath sounds: Normal breath sounds. No wheezing or rales.  Musculoskeletal:     Cervical back: Neck supple.     Right lower leg: No edema.     Left lower leg: No edema.  Lymphadenopathy:     Cervical: No cervical adenopathy.  Neurological:     Mental Status: She is alert.      No results found for any visits on 04/07/23.  Last CBC Lab Results  Component Value Date   WBC 6.0 11/11/2022   HGB 12.4 11/11/2022   HCT 38.1 11/11/2022   MCV 84.5 11/11/2022   MCH 27.6 06/02/2016   RDW 14.5 11/11/2022   PLT 375.0 11/11/2022   Last metabolic panel Lab Results  Component Value Date   GLUCOSE 92 02/03/2023   NA 134 (L) 02/03/2023   K 3.9 02/03/2023   CL 100 02/03/2023   CO2 26 02/03/2023   BUN 9 02/03/2023   CREATININE 0.91 02/03/2023   GFR 73.27 02/03/2023   CALCIUM 9.5 02/03/2023   PROT 7.5 02/03/2023   ALBUMIN 4.3 02/03/2023   BILITOT 1.0 02/03/2023   ALKPHOS 62 02/03/2023   AST 18 02/03/2023   ALT 17 02/03/2023   Last lipids Lab Results  Component Value Date   CHOL 292 (H) 02/03/2023   HDL 59.50 02/03/2023   LDLCALC 197 (H) 02/03/2023   LDLDIRECT 144.0 09/02/2017   TRIG 175.0 (H) 02/03/2023   CHOLHDL 5 02/03/2023   Last hemoglobin A1c Lab Results  Component Value Date   HGBA1C 6.5 02/03/2023   Last thyroid functions Lab Results  Component Value Date   TSH 2.86 11/11/2022   T4TOTAL 10.0 09/01/2013      The 10-year ASCVD risk score (Arnett DK, et al., 2019) is: 3.2%    Assessment & Plan:   #1 type 2 diabetes.  Patient tolerating Mounjaro generally well.  She has lost almost 20 pounds since June.  She feels like current dose of 5 mg may be suppressing her appetite to severely and she would like to moderate go back to 2.5 mg to see if she can maintain her current weight with that dosage.  We rewrote her Mounjaro for 2.5 mg subcutaneous once weekly for 79-month supply with refills. -Set up follow-up in approximately  2 months and recheck A1c then  #2 hypertension improved with recent weight loss.  No recent orthostatic symptoms.  We have cautioned her that with her weight loss if she starts to have lightheadedness consider reducing her amlodipine to 2.5 mg daily.  #3 hyperlipidemia.  Extremely high total cholesterol and LDL cholesterol.  We  had discussed statin recently but she declined.  We do plan to recheck fasting lipids with her labs in 2 months.  Continue low saturated fat diet.  Evelena Peat, MD

## 2023-04-09 ENCOUNTER — Other Ambulatory Visit: Payer: Self-pay

## 2023-04-09 ENCOUNTER — Other Ambulatory Visit (HOSPITAL_BASED_OUTPATIENT_CLINIC_OR_DEPARTMENT_OTHER): Payer: Self-pay

## 2023-04-09 MED ORDER — AMPHETAMINE-DEXTROAMPHETAMINE 15 MG PO TABS
15.0000 mg | ORAL_TABLET | Freq: Two times a day (BID) | ORAL | 0 refills | Status: DC
  Filled 2023-04-09 – 2023-05-04 (×2): qty 60, 30d supply, fill #0

## 2023-04-13 ENCOUNTER — Other Ambulatory Visit (HOSPITAL_COMMUNITY): Payer: Self-pay

## 2023-04-21 ENCOUNTER — Other Ambulatory Visit (HOSPITAL_BASED_OUTPATIENT_CLINIC_OR_DEPARTMENT_OTHER): Payer: Self-pay

## 2023-05-04 ENCOUNTER — Other Ambulatory Visit (HOSPITAL_BASED_OUTPATIENT_CLINIC_OR_DEPARTMENT_OTHER): Payer: Self-pay

## 2023-05-05 ENCOUNTER — Ambulatory Visit: Payer: Commercial Managed Care - PPO | Admitting: Family Medicine

## 2023-05-12 IMAGING — CT CT CARDIAC CORONARY ARTERY CALCIUM SCORE
3 series · 14 of 20 positions shown, 15 images · non-contrast
Comparison: 11/05/2016
COMPARISON: 11/05/2016

Addendum:
EXAM:
OVER-READ INTERPRETATION  CT CHEST

The following report is an over-read performed by radiologist Dr.
Laaouina Tiger [REDACTED] on 02/13/2021. This over-read
does not include interpretation of cardiac or coronary anatomy or
pathology. The coronary calcium score interpretation by the
cardiologist is attached.
CLINICAL DATA: Cardiovascular Disease Risk stratification
Coronary Calcium Score
TECHNIQUE: A gated, non-contrast computed tomography scan of the heart was
performed using 3mm slice thickness. Axial images were analyzed on a
dedicated workstation. Calcium scoring of the coronary arteries was
performed using the Agatston method.

[Series 2: casc 3.0 bv41 2 bestdiast 73 % · axial · 0.42mm/px · z∈[-168,-98]mm · 4 of 39 slices shown, 5 images]
[im 8/39  vessel]
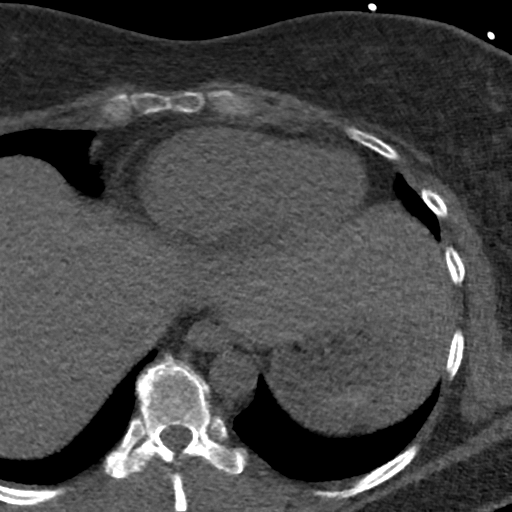
[im 8/39  lung]
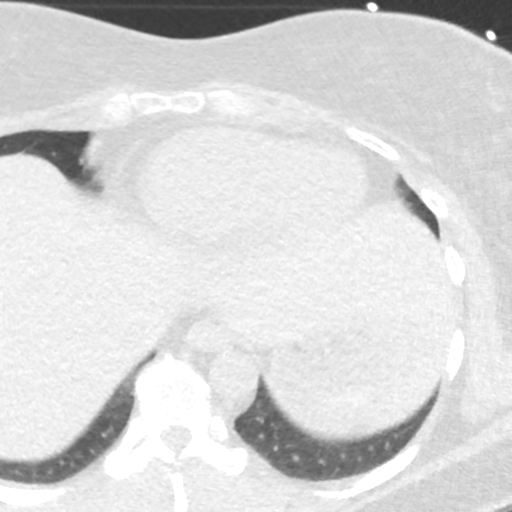
[im 16/39  vessel]
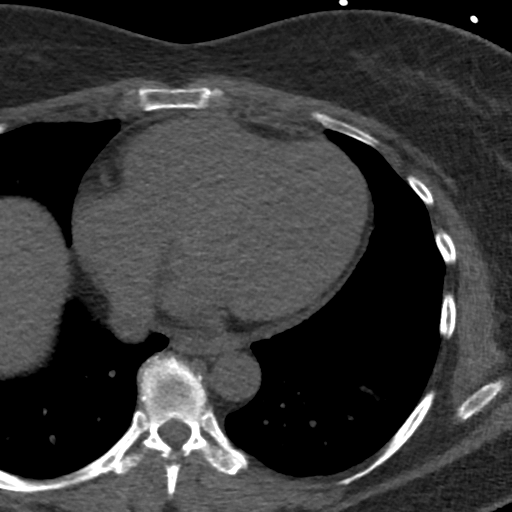
[im 23/39  vessel]
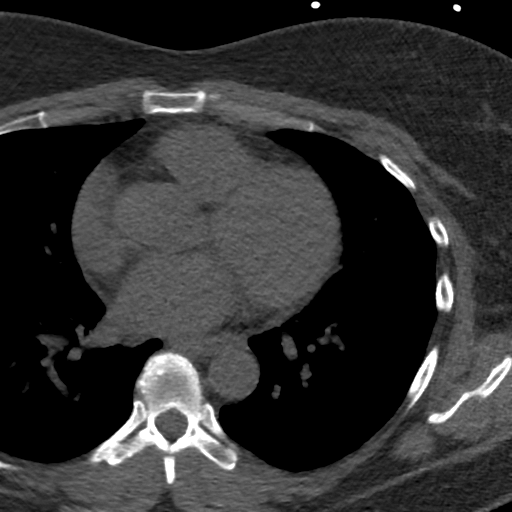
[im 31/39  vessel]
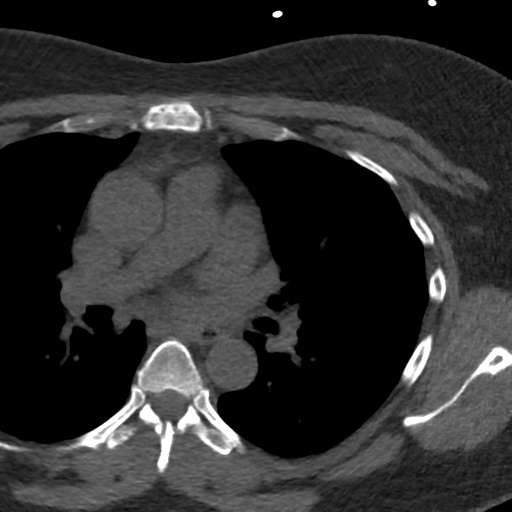

[Series 3: lung 73 % · axial · 0.67mm/px · z∈[-170,-96]mm · 5 of 39 slices shown]
[im 7/39  lung]
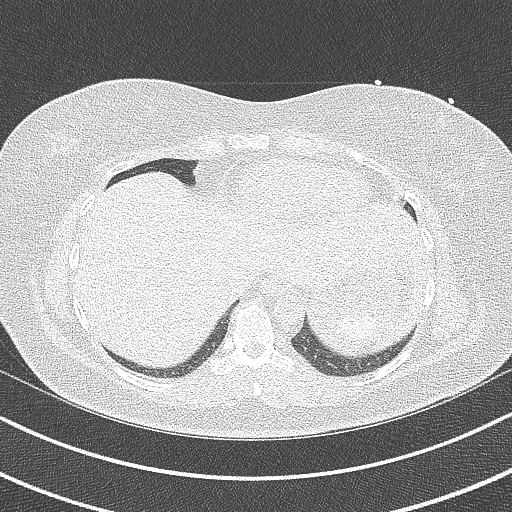
[im 13/39  lung]
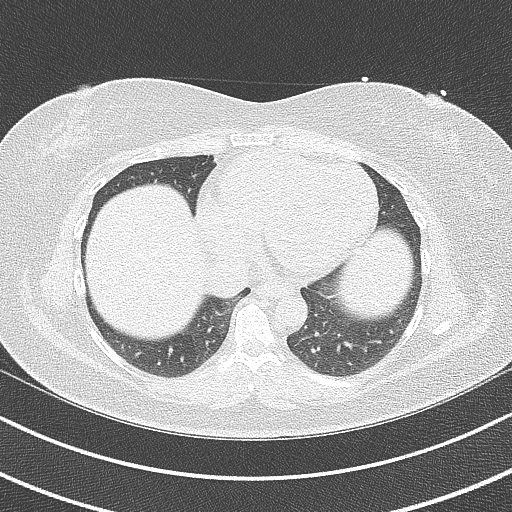
[im 20/39  lung]
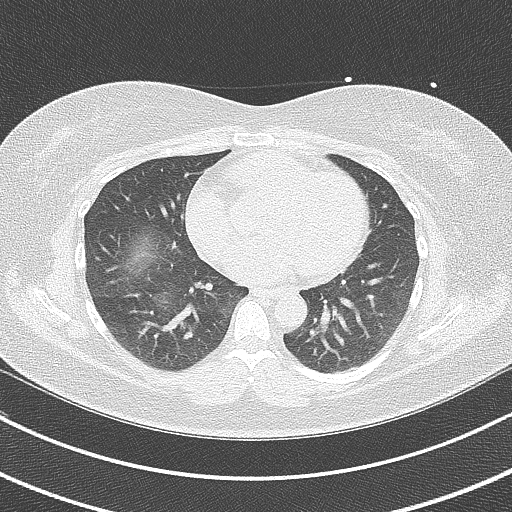
[im 26/39  lung]
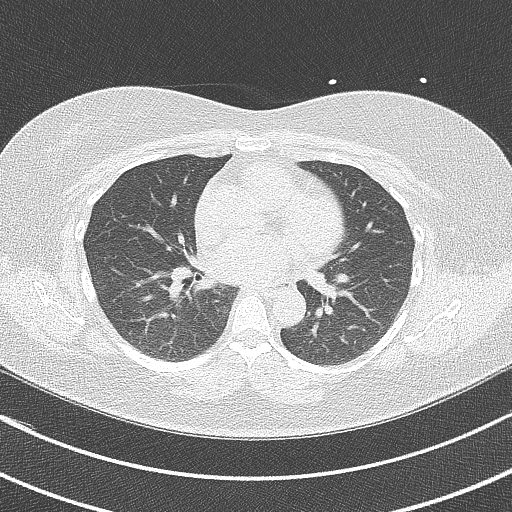
[im 32/39  lung]
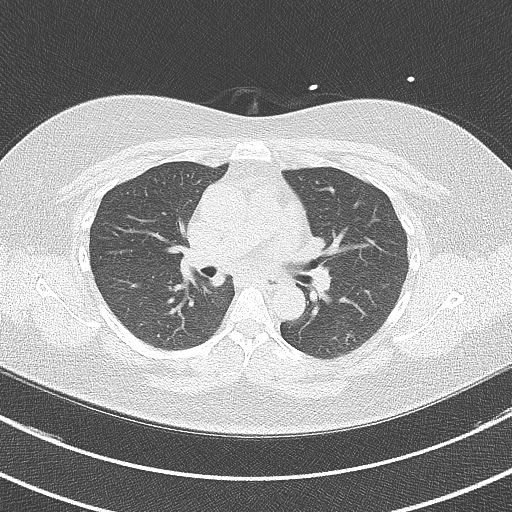

[Series 4: lung st 73 % · axial · 0.67mm/px · z∈[-170,-96]mm · 5 of 39 slices shown]
[im 7/39  lung]
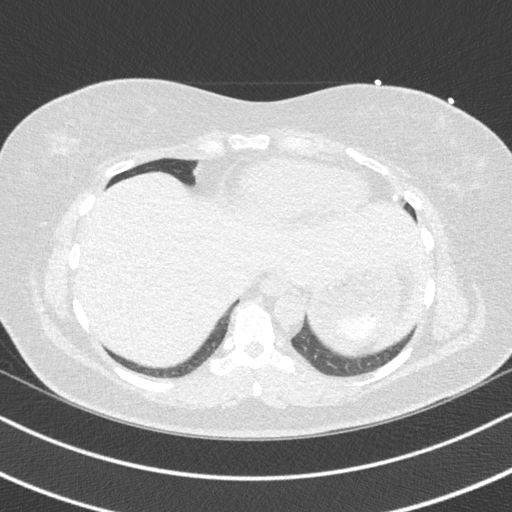
[im 13/39  lung]
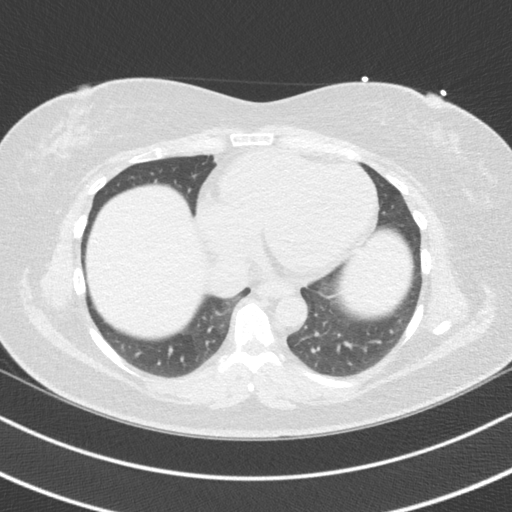
[im 20/39  lung]
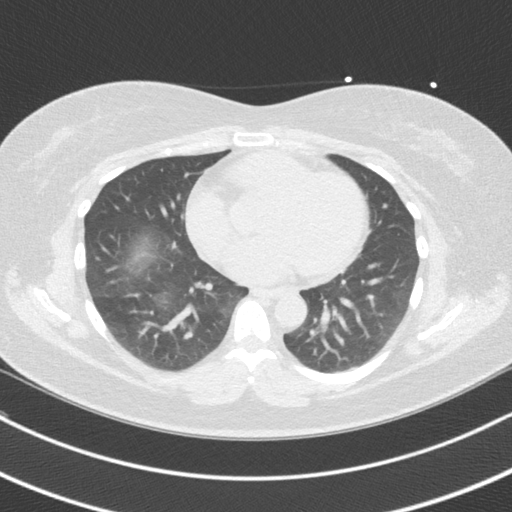
[im 26/39  lung]
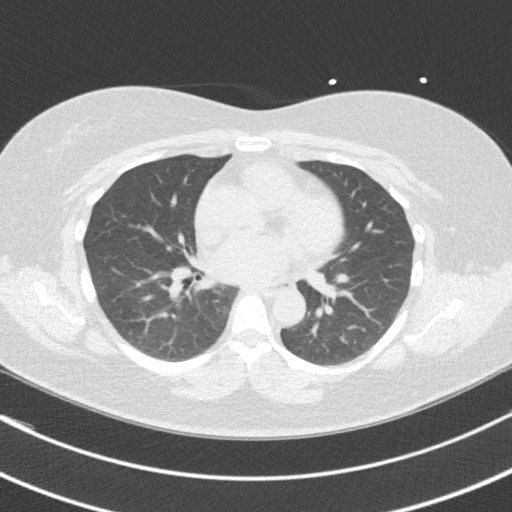
[im 32/39  lung]
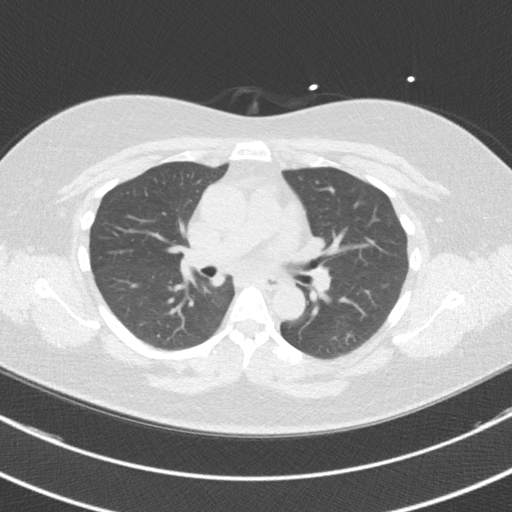

[14 of 20 positions shown; findings below may reference images not displayed]

FINDINGS: Vascular: Heart is normal size. Aorta normal caliber. Scattered
calcifications in the aortic arch.

Mediastinum/Nodes: No adenopathy

Lungs/Pleura: No confluent opacities or effusions.

Upper Abdomen: Imaging into the upper abdomen demonstrates no acute
findings.

Musculoskeletal: Chest wall soft tissues are unremarkable. No acute
bony abnormality.
IMPRESSION: No acute or significant extracardiac abnormality.
FINDINGS: Coronary Calcium Score:

Left main: 0

Left anterior descending artery: 0

Left circumflex artery: 0

Right coronary artery: 0

Total: 0

Percentile: NA

Pericardium: Normal.

Ascending Aorta: Normal caliber.

Non-cardiac: See separate report from [REDACTED].
IMPRESSION: Coronary calcium score of 0.



If CAC=0, it is reasonable to withhold statin therapy and reassess
in 5 to 10 years, as long as higher risk conditions are absent
(diabetes mellitus, family history of premature CHD in first degree
relatives (males <55 years; females <65 years), cigarette smoking,
or LDL >=190 mg/dL).

If CAC is 1 to 99, it is reasonable to initiate statin therapy for
patients >=55 years of age.

If CAC is >=100 or >=75th percentile, it is reasonable to initiate
statin therapy at any age.

Cardiology referral should be considered for patients with CAC
scores >=400 or >=75th percentile.

*3683 AHA/ACC/AACVPR/AAPA/ABC/RACH/MZO/KLEVER/Amare/ISBELI/ROMANTIQUE/GUISLAIN
Guideline on the Management of Blood Cholesterol: A Report of the
American College of Cardiology/American Heart Association Task Force
on Clinical Practice Guidelines. J Am Coll Cardiol.
8480;73(24):2716-2323.

*** End of Addendum ***
EXAM:
OVER-READ INTERPRETATION  CT CHEST

The following report is an over-read performed by radiologist Dr.
Laaouina Tiger [REDACTED] on 02/13/2021. This over-read
does not include interpretation of cardiac or coronary anatomy or
pathology. The coronary calcium score interpretation by the
cardiologist is attached.
FINDINGS: Vascular: Heart is normal size. Aorta normal caliber. Scattered
calcifications in the aortic arch.

Mediastinum/Nodes: No adenopathy

Lungs/Pleura: No confluent opacities or effusions.

Upper Abdomen: Imaging into the upper abdomen demonstrates no acute
findings.

Musculoskeletal: Chest wall soft tissues are unremarkable. No acute
bony abnormality.
IMPRESSION: No acute or significant extracardiac abnormality.

## 2023-05-26 ENCOUNTER — Other Ambulatory Visit (HOSPITAL_BASED_OUTPATIENT_CLINIC_OR_DEPARTMENT_OTHER): Payer: Self-pay

## 2023-05-26 ENCOUNTER — Other Ambulatory Visit: Payer: Self-pay | Admitting: Family Medicine

## 2023-05-26 ENCOUNTER — Other Ambulatory Visit: Payer: Self-pay

## 2023-05-26 MED ORDER — AMPHETAMINE-DEXTROAMPHETAMINE 15 MG PO TABS
15.0000 mg | ORAL_TABLET | Freq: Two times a day (BID) | ORAL | 0 refills | Status: DC
Start: 2023-05-26 — End: 2023-07-21
  Filled 2023-05-26 – 2023-06-26 (×4): qty 60, 30d supply, fill #0

## 2023-05-27 ENCOUNTER — Other Ambulatory Visit (HOSPITAL_BASED_OUTPATIENT_CLINIC_OR_DEPARTMENT_OTHER): Payer: Self-pay

## 2023-06-01 ENCOUNTER — Other Ambulatory Visit (HOSPITAL_BASED_OUTPATIENT_CLINIC_OR_DEPARTMENT_OTHER): Payer: Self-pay

## 2023-06-05 DIAGNOSIS — H6121 Impacted cerumen, right ear: Secondary | ICD-10-CM | POA: Diagnosis not present

## 2023-06-08 ENCOUNTER — Ambulatory Visit: Payer: Commercial Managed Care - PPO | Admitting: Family Medicine

## 2023-06-10 ENCOUNTER — Encounter: Payer: Self-pay | Admitting: Family Medicine

## 2023-06-10 ENCOUNTER — Other Ambulatory Visit (HOSPITAL_BASED_OUTPATIENT_CLINIC_OR_DEPARTMENT_OTHER): Payer: Self-pay

## 2023-06-10 ENCOUNTER — Ambulatory Visit: Payer: Commercial Managed Care - PPO | Admitting: Family Medicine

## 2023-06-10 VITALS — BP 132/84 | HR 90 | Temp 98.1°F | Ht 59.0 in | Wt 134.5 lb

## 2023-06-10 DIAGNOSIS — E1165 Type 2 diabetes mellitus with hyperglycemia: Secondary | ICD-10-CM

## 2023-06-10 DIAGNOSIS — J4540 Moderate persistent asthma, uncomplicated: Secondary | ICD-10-CM

## 2023-06-10 DIAGNOSIS — Z23 Encounter for immunization: Secondary | ICD-10-CM | POA: Diagnosis not present

## 2023-06-10 DIAGNOSIS — E785 Hyperlipidemia, unspecified: Secondary | ICD-10-CM | POA: Diagnosis not present

## 2023-06-10 DIAGNOSIS — Z7985 Long-term (current) use of injectable non-insulin antidiabetic drugs: Secondary | ICD-10-CM | POA: Diagnosis not present

## 2023-06-10 LAB — LIPID PANEL
Cholesterol: 315 mg/dL — ABNORMAL HIGH (ref 0–200)
HDL: 76.9 mg/dL (ref >39.00)
LDL Cholesterol: 211 mg/dL — ABNORMAL HIGH (ref 0–99)
NonHDL: 237.63
Total CHOL/HDL Ratio: 4
Triglycerides: 132 mg/dL (ref 0.0–149.0)
VLDL: 26.4 mg/dL (ref 0.0–40.0)

## 2023-06-10 LAB — HEMOGLOBIN A1C: Hgb A1c MFr Bld: 6.1 % (ref 4.6–6.5)

## 2023-06-10 MED ORDER — PREDNISONE 20 MG PO TABS
40.0000 mg | ORAL_TABLET | Freq: Every day | ORAL | 2 refills | Status: DC | PRN
  Filled 2023-06-10 – 2023-06-26 (×3): qty 10, 5d supply, fill #0

## 2023-06-10 MED ORDER — TIRZEPATIDE 5 MG/0.5ML ~~LOC~~ SOAJ
5.0000 mg | SUBCUTANEOUS | 3 refills | Status: DC
Start: 2023-06-10 — End: 2024-05-03
  Filled 2023-06-10: qty 2, 28d supply, fill #0
  Filled 2023-06-12: qty 6, 84d supply, fill #0
  Filled 2023-06-26 – 2023-06-29 (×2): qty 2, 28d supply, fill #0
  Filled 2023-07-21 – 2023-07-23 (×2): qty 2, 28d supply, fill #1
  Filled 2023-08-23: qty 2, 28d supply, fill #2
  Filled 2023-09-26: qty 2, 28d supply, fill #3
  Filled 2023-10-24: qty 2, 28d supply, fill #4
  Filled 2023-11-28: qty 2, 28d supply, fill #5
  Filled 2024-01-11: qty 2, 28d supply, fill #6
  Filled 2024-02-17: qty 2, 28d supply, fill #7
  Filled 2024-03-26: qty 2, 28d supply, fill #8
  Filled 2024-04-27: qty 2, 28d supply, fill #9
  Filled ????-??-??: fill #0

## 2023-06-10 NOTE — Progress Notes (Signed)
Established Patient Office Visit  Subjective   Patient ID: Marissa Horn, female    DOB: 04-Jan-1971  Age: 52 y.o. MRN: [id number]  Chief Complaint  Patient presents with   Medical Management of Chronic Issues    HPI   Marissa Horn was seen today for medical follow-up.  Was seen recently and had lab work done with very high lipids with LDL 197.  Positive history of CAD in her father who had at least 1 coronary stent placed.  Unknown age.  She had coronary calcium score couple years ago of 0.  Non-smoker.  Does have type 2 diabetes with recent A1c 6.5%.  She previously declined statin use.  Currently on Mounjaro.  We had recently reduced her dosage from 5 to 2.5 mg because of some significant appetite suppression.  Weight is up a couple pounds.  She is requesting repeat A1c today.  History of asthma with intermittent recurrence she has inhaler to use as needed.  She has previously taken just 1 dose of prednisone such as 40 mg with good results for acute flareups  She is in need of several immunizations today and requesting flu vaccine, Shingrix, and Tdap.  Past Medical History:  Diagnosis Date   Adult ADHD    Allergy    Arthritis    hands   Asthma    ?, inhaler use as needed   Complication of anesthesia 1990   muscle relaxant caused lung to collapse   Difficult intubation 1990   Dyslipidemia    History of chicken pox    IBS (irritable bowel syndrome)    ?   Migraine    Past Surgical History:  Procedure Laterality Date   ABDOMINOPLASTY  05/04/2012   Procedure: ABDOMINOPLASTY;  Surgeon: Etter Sjogren, MD;  Location: [facility];  Service: Plastics;  Laterality: N/A;   ANAL FISSURE REPAIR     age 28   EYE SURGERY Left    age 51   GYNECOLOGIC CRYOSURGERY     laparoscopic hysterectomy  05/04/2012   WISDOM TOOTH EXTRACTION      reports that she has never smoked. She has never used smokeless tobacco. She reports that she does not drink alcohol and does not use drugs. family  history includes Arthritis in her mother; Cataracts in her father and mother; Glaucoma in her mother; Heart disease (age of onset: 33) in her father; Hyperlipidemia in her brother, father, and mother; Irritable bowel syndrome in her mother; Thyroid disease in her sister; Ulcerative colitis in her brother. Allergies  Allergen Reactions   Scopolamine Anaphylaxis   Vyvanse [Lisdexamfetamine Dimesylate]     Severe migraine   Bactrim [Sulfamethoxazole-Trimethoprim]     Joint pain, fever   Biaxin [Clarithromycin] Nausea And Vomiting    Projectile vomiting   Tamiflu [Oseltamivir Phosphate] Nausea And Vomiting    Projectile vomiting   Cephalexin Nausea And Vomiting   Asa [Aspirin] Rash    Pt takes ibuprofen without problems    Review of Systems  Constitutional:  Negative for chills, fever and malaise/fatigue.  Eyes:  Negative for blurred vision.  Respiratory:  Positive for wheezing. Negative for shortness of breath.   Cardiovascular:  Negative for chest pain.  Neurological:  Negative for dizziness, weakness and headaches.      Objective:     BP 132/84 (BP Location: Left Arm, Patient Position: Sitting, Cuff Size: Normal)   Pulse 90   Temp 98.1 F (36.7 C) (Oral)   Ht 4\' 11"  (1.499 m)   Wt  134 lb 8 oz (61 kg)   LMP 04/04/2012   SpO2 98%   BMI 27.17 kg/m  BP Readings from Last 3 Encounters:  06/10/23 132/84  04/07/23 100/72  02/02/23 120/80   Wt Readings from Last 3 Encounters:  06/10/23 134 lb 8 oz (61 kg)  04/07/23 132 lb (59.9 kg)  02/02/23 144 lb 3.2 oz (65.4 kg)      Physical Exam Vitals reviewed.  Constitutional:      General: She is not in acute distress.    Appearance: She is well-developed.  Eyes:     Pupils: Pupils are equal, round, and reactive to light.  Neck:     Thyroid: No thyromegaly.     Vascular: No JVD.  Cardiovascular:     Rate and Rhythm: Normal rate and regular rhythm.     Heart sounds:     No gallop.  Pulmonary:     Effort: Pulmonary  effort is normal. No respiratory distress.     Breath sounds: Normal breath sounds. No wheezing or rales.  Musculoskeletal:     Cervical back: Neck supple.  Neurological:     Mental Status: She is alert.      No results found for any visits on 06/10/23.  Last CBC Lab Results  Component Value Date   WBC 6.0 11/11/2022   HGB 12.4 11/11/2022   HCT 38.1 11/11/2022   MCV 84.5 11/11/2022   MCH 27.6 06/02/2016   RDW 14.5 11/11/2022   PLT 375.0 11/11/2022   Last metabolic panel Lab Results  Component Value Date   GLUCOSE 92 02/03/2023   NA 134 (L) 02/03/2023   K 3.9 02/03/2023   CL 100 02/03/2023   CO2 26 02/03/2023   BUN 9 02/03/2023   CREATININE 0.91 02/03/2023   GFR 73.27 02/03/2023   CALCIUM 9.5 02/03/2023   PROT 7.5 02/03/2023   ALBUMIN 4.3 02/03/2023   BILITOT 1.0 02/03/2023   ALKPHOS 62 02/03/2023   AST 18 02/03/2023   ALT 17 02/03/2023   Last lipids Lab Results  Component Value Date   CHOL 292 (H) 02/03/2023   HDL 59.50 02/03/2023   LDLCALC 197 (H) 02/03/2023   LDLDIRECT 144.0 09/02/2017   TRIG 175.0 (H) 02/03/2023   CHOLHDL 5 02/03/2023   Last hemoglobin A1c Lab Results  Component Value Date   HGBA1C 6.5 02/03/2023      The 10-year ASCVD risk score (Arnett DK, et al., 2019) is: 5.5%    Assessment & Plan:   Problem List Items Addressed This Visit       Unprioritized   Type 2 diabetes mellitus with hyperglycemia (HCC) - Primary   Relevant Medications   tirzepatide (MOUNJARO) 5 MG/0.5ML Pen   Other Relevant Orders   Hemoglobin A1c   Elevated lipids   Relevant Orders   Lipid panel   Other Visit Diagnoses     Need for influenza vaccination       Relevant Orders   Flu vaccine trivalent PF, 6mos and older(Flulaval,Afluria,Fluarix,Fluzone) (Completed)   Need for shingles vaccine       Relevant Orders   Zoster Recombinant (Shingrix ) (Completed)   Need for tetanus booster       Relevant Orders   Tdap vaccine greater than or equal to 7yo  IM (Completed)     -Type 2 diabetes with history of fair control with previous A1c 6.5%.  Currently on Mounjaro.  Recheck A1c today.  She would like to consider titration of Mounjaro back to 5  mg because of recent mild weight gain  -Repeat fasting lipids today.  Even though she had coronary calcium score of 0 would have low threshold to start statin especially with her diabetes history and family history of CAD  -Regarding her asthma overall stable.  She is requesting oral prednisone to take small burst with acute flareup and this was written  -Health maintenance addressed.  She is due for flu vaccine, Tdap, and Shingrix and these were given.  Return in 2 to 6 months for repeat Shingrix  Return in about 3 months (around 09/10/2023).    Evelena Peat, MD

## 2023-06-11 ENCOUNTER — Telehealth: Payer: Self-pay | Admitting: Family Medicine

## 2023-06-11 ENCOUNTER — Other Ambulatory Visit (HOSPITAL_BASED_OUTPATIENT_CLINIC_OR_DEPARTMENT_OTHER): Payer: Self-pay

## 2023-06-11 ENCOUNTER — Other Ambulatory Visit: Payer: Self-pay

## 2023-06-11 NOTE — Telephone Encounter (Signed)
Pt is returning mykal call 

## 2023-06-12 ENCOUNTER — Other Ambulatory Visit: Payer: Self-pay | Admitting: Family Medicine

## 2023-06-12 ENCOUNTER — Other Ambulatory Visit (HOSPITAL_BASED_OUTPATIENT_CLINIC_OR_DEPARTMENT_OTHER): Payer: Self-pay

## 2023-06-12 DIAGNOSIS — J4520 Mild intermittent asthma, uncomplicated: Secondary | ICD-10-CM

## 2023-06-12 MED ORDER — ROSUVASTATIN CALCIUM 10 MG PO TABS
10.0000 mg | ORAL_TABLET | Freq: Every day | ORAL | 0 refills | Status: DC
  Filled 2023-06-12: qty 90, 90d supply, fill #0

## 2023-06-12 MED ORDER — LEVALBUTEROL TARTRATE 45 MCG/ACT IN AERO
1.0000 | INHALATION_SPRAY | Freq: Four times a day (QID) | RESPIRATORY_TRACT | 1 refills | Status: AC | PRN
  Filled 2023-06-26 – 2023-06-29 (×2): qty 15, 25d supply, fill #0
  Filled ????-??-??: fill #0

## 2023-06-12 MED ORDER — AZITHROMYCIN 250 MG PO TABS
ORAL_TABLET | ORAL | 0 refills | Status: AC
  Filled 2023-06-12: qty 6, 5d supply, fill #0

## 2023-06-12 NOTE — Addendum Note (Signed)
Addended by: Christy Sartorius on: 06/12/2023 01:04 PM   Modules accepted: Orders

## 2023-06-12 NOTE — Addendum Note (Signed)
Addended by: Christy Sartorius on: 06/12/2023 12:58 PM   Modules accepted: Orders

## 2023-06-12 NOTE — Addendum Note (Signed)
Addended by: Christy Sartorius on: 06/12/2023 11:25 AM   Modules accepted: Orders

## 2023-06-12 NOTE — Telephone Encounter (Signed)
Please see result note 

## 2023-06-22 ENCOUNTER — Other Ambulatory Visit (HOSPITAL_BASED_OUTPATIENT_CLINIC_OR_DEPARTMENT_OTHER): Payer: Self-pay

## 2023-06-23 ENCOUNTER — Other Ambulatory Visit (HOSPITAL_BASED_OUTPATIENT_CLINIC_OR_DEPARTMENT_OTHER): Payer: Self-pay

## 2023-06-26 ENCOUNTER — Other Ambulatory Visit: Payer: Self-pay

## 2023-06-26 ENCOUNTER — Other Ambulatory Visit (HOSPITAL_BASED_OUTPATIENT_CLINIC_OR_DEPARTMENT_OTHER): Payer: Self-pay

## 2023-06-29 ENCOUNTER — Encounter: Payer: Self-pay | Admitting: Family Medicine

## 2023-06-29 ENCOUNTER — Other Ambulatory Visit (HOSPITAL_COMMUNITY): Payer: Self-pay

## 2023-06-29 ENCOUNTER — Telehealth: Payer: Self-pay

## 2023-06-29 ENCOUNTER — Other Ambulatory Visit (HOSPITAL_BASED_OUTPATIENT_CLINIC_OR_DEPARTMENT_OTHER): Payer: Self-pay

## 2023-06-29 ENCOUNTER — Other Ambulatory Visit: Payer: Self-pay

## 2023-06-29 NOTE — Telephone Encounter (Signed)
Pharmacy aware of Approval

## 2023-06-29 NOTE — Telephone Encounter (Signed)
PA request has been Submitted. New Encounter created for follow up. For additional info see Pharmacy Prior Auth telephone encounter from 11/25.

## 2023-06-29 NOTE — Telephone Encounter (Signed)
Noted  

## 2023-06-29 NOTE — Telephone Encounter (Signed)
*  Primary  Pharmacy Patient Advocate Encounter  Received notification from [facility] that Prior Authorization for Mounjaro 5MG /0.5ML auto-injectors  has been APPROVED from 06/29/2023 to 06/28/2024. Ran test claim, Copay is $25.00. This test claim was processed through [facility]- copay amounts may vary at other pharmacies due to pharmacy/plan contracts, or as the patient moves through the different stages of their insurance plan.   PA #/Case ID/Reference #: [id number]

## 2023-06-30 ENCOUNTER — Other Ambulatory Visit (HOSPITAL_BASED_OUTPATIENT_CLINIC_OR_DEPARTMENT_OTHER): Payer: Self-pay

## 2023-07-21 ENCOUNTER — Other Ambulatory Visit: Payer: Self-pay | Admitting: Family Medicine

## 2023-07-21 ENCOUNTER — Other Ambulatory Visit (HOSPITAL_BASED_OUTPATIENT_CLINIC_OR_DEPARTMENT_OTHER): Payer: Self-pay

## 2023-07-23 ENCOUNTER — Other Ambulatory Visit: Payer: Self-pay

## 2023-07-23 ENCOUNTER — Other Ambulatory Visit (HOSPITAL_BASED_OUTPATIENT_CLINIC_OR_DEPARTMENT_OTHER): Payer: Self-pay

## 2023-07-23 ENCOUNTER — Other Ambulatory Visit: Payer: Self-pay | Admitting: Family Medicine

## 2023-07-23 DIAGNOSIS — J4520 Mild intermittent asthma, uncomplicated: Secondary | ICD-10-CM

## 2023-07-23 MED ORDER — BUDESONIDE-FORMOTEROL FUMARATE 80-4.5 MCG/ACT IN AERO
2.0000 | INHALATION_SPRAY | Freq: Two times a day (BID) | RESPIRATORY_TRACT | 2 refills | Status: DC
  Filled 2023-08-23: qty 10.2, 30d supply, fill #0
  Filled 2023-11-28: qty 10.2, 30d supply, fill #1
  Filled 2024-01-11: qty 10.2, 30d supply, fill #2

## 2023-07-23 MED ORDER — AMPHETAMINE-DEXTROAMPHETAMINE 15 MG PO TABS
15.0000 mg | ORAL_TABLET | Freq: Two times a day (BID) | ORAL | 0 refills | Status: DC
  Filled 2023-07-23 – 2023-07-24 (×2): qty 60, 30d supply, fill #0

## 2023-07-24 ENCOUNTER — Other Ambulatory Visit (HOSPITAL_BASED_OUTPATIENT_CLINIC_OR_DEPARTMENT_OTHER): Payer: Self-pay

## 2023-07-24 ENCOUNTER — Other Ambulatory Visit: Payer: Self-pay

## 2023-08-23 ENCOUNTER — Other Ambulatory Visit: Payer: Self-pay | Admitting: Family Medicine

## 2023-08-24 ENCOUNTER — Other Ambulatory Visit (HOSPITAL_BASED_OUTPATIENT_CLINIC_OR_DEPARTMENT_OTHER): Payer: Self-pay

## 2023-08-24 ENCOUNTER — Other Ambulatory Visit: Payer: Self-pay

## 2023-08-24 MED ORDER — AMPHETAMINE-DEXTROAMPHETAMINE 15 MG PO TABS
15.0000 mg | ORAL_TABLET | Freq: Two times a day (BID) | ORAL | 0 refills | Status: DC
  Filled 2023-08-24: qty 60, 30d supply, fill #0

## 2023-08-24 MED ORDER — ROSUVASTATIN CALCIUM 10 MG PO TABS
10.0000 mg | ORAL_TABLET | Freq: Every day | ORAL | 0 refills | Status: DC
  Filled 2023-08-24: qty 90, 90d supply, fill #0

## 2023-08-25 ENCOUNTER — Other Ambulatory Visit: Payer: Self-pay

## 2023-08-25 ENCOUNTER — Other Ambulatory Visit (HOSPITAL_BASED_OUTPATIENT_CLINIC_OR_DEPARTMENT_OTHER): Payer: Self-pay

## 2023-09-11 ENCOUNTER — Ambulatory Visit: Payer: Commercial Managed Care - PPO | Admitting: Family Medicine

## 2023-09-14 ENCOUNTER — Other Ambulatory Visit (HOSPITAL_COMMUNITY): Payer: Self-pay

## 2023-09-14 ENCOUNTER — Ambulatory Visit: Payer: Commercial Managed Care - PPO | Admitting: Family Medicine

## 2023-09-16 ENCOUNTER — Ambulatory Visit: Payer: Commercial Managed Care - PPO | Admitting: Family Medicine

## 2023-09-18 ENCOUNTER — Encounter: Payer: Self-pay | Admitting: Family Medicine

## 2023-09-18 ENCOUNTER — Ambulatory Visit: Payer: Commercial Managed Care - PPO | Admitting: Family Medicine

## 2023-09-18 VITALS — BP 126/80 | HR 70 | Temp 97.8°F | Wt 131.3 lb

## 2023-09-18 DIAGNOSIS — E785 Hyperlipidemia, unspecified: Secondary | ICD-10-CM

## 2023-09-18 DIAGNOSIS — Z7985 Long-term (current) use of injectable non-insulin antidiabetic drugs: Secondary | ICD-10-CM

## 2023-09-18 DIAGNOSIS — I10 Essential (primary) hypertension: Secondary | ICD-10-CM | POA: Diagnosis not present

## 2023-09-18 DIAGNOSIS — E1165 Type 2 diabetes mellitus with hyperglycemia: Secondary | ICD-10-CM

## 2023-09-18 LAB — HEPATIC FUNCTION PANEL
ALT: 14 U/L (ref 0–35)
AST: 17 U/L (ref 0–37)
Albumin: 4.5 g/dL (ref 3.5–5.2)
Alkaline Phosphatase: 50 U/L (ref 39–117)
Bilirubin, Direct: 0.1 mg/dL (ref 0.0–0.3)
Total Bilirubin: 1.1 mg/dL (ref 0.2–1.2)
Total Protein: 7.4 g/dL (ref 6.0–8.3)

## 2023-09-18 LAB — LIPID PANEL
Cholesterol: 188 mg/dL (ref 0–200)
HDL: 64.5 mg/dL (ref >39.00)
LDL Cholesterol: 98 mg/dL (ref 0–99)
NonHDL: 123.17
Total CHOL/HDL Ratio: 3
Triglycerides: 127 mg/dL (ref 0.0–149.0)
VLDL: 25.4 mg/dL (ref 0.0–40.0)

## 2023-09-18 LAB — HEMOGLOBIN A1C: Hgb A1c MFr Bld: 6.2 % (ref 4.6–6.5)

## 2023-09-18 NOTE — Progress Notes (Signed)
Established Patient Office Visit  Subjective   Patient ID: Marissa Horn, female    DOB: August 12, 1970  Age: 53 y.o. MRN: [id number]  Chief Complaint  Patient presents with   Medical Management of Chronic Issues    HPI   Marissa Horn is seen today for medical follow-up.  He has history of hypertension, moderate persistent asthma, IBS, type 2 diabetes, adult ADD, hyperlipidemia.  She has history of very high lipids but fortunately coronary calcium score of 0.  She does have also family history of CAD.  In fact, her father just had a recent MI.  He lives in [place].  He had to get a stent.  We had placed her on rosuvastatin 10 mg daily but she has had some recent increase in myalgias and arthralgias especially upper extremities.  She wonders that she may need to try different statin.  Currently doing well with Mounjaro 5 mg once weekly.  Interestingly, she has noted that hand tremor which she has had for years resolved with going on Mounjaro.  She is very pleased currently with her weight.  She has been taking her blood pressure medicine every other day and blood pressures been stable.  Her last A1c in November was improved to 6.1.  Past Medical History:  Diagnosis Date   Adult ADHD    Allergy    Arthritis    hands   Asthma    ?, inhaler use as needed   Complication of anesthesia 1990   muscle relaxant caused lung to collapse   Difficult intubation 1990   Dyslipidemia    History of chicken pox    IBS (irritable bowel syndrome)    ?   Migraine    Past Surgical History:  Procedure Laterality Date   ABDOMINOPLASTY  05/04/2012   Procedure: ABDOMINOPLASTY;  Surgeon: Etter Sjogren, MD;  Location: [facility];  Service: Plastics;  Laterality: N/A;   ANAL FISSURE REPAIR     age 54   EYE SURGERY Left    age 75   GYNECOLOGIC CRYOSURGERY     laparoscopic hysterectomy  05/04/2012   WISDOM TOOTH EXTRACTION      reports that she has never smoked. She has never used smokeless tobacco. She  reports that she does not drink alcohol and does not use drugs. family history includes Arthritis in her mother; Cataracts in her father and mother; Glaucoma in her mother; Heart disease (age of onset: 64) in her father; Hyperlipidemia in her brother, father, and mother; Irritable bowel syndrome in her mother; Thyroid disease in her sister; Ulcerative colitis in her brother. Allergies  Allergen Reactions   Scopolamine Anaphylaxis   Vyvanse [Lisdexamfetamine Dimesylate]     Severe migraine   Bactrim [Sulfamethoxazole-Trimethoprim]     Joint pain, fever   Biaxin [Clarithromycin] Nausea And Vomiting    Projectile vomiting   Tamiflu [Oseltamivir Phosphate] Nausea And Vomiting    Projectile vomiting   Cephalexin Nausea And Vomiting   Asa [Aspirin] Rash    Pt takes ibuprofen without problems    Review of Systems  Constitutional:  Negative for chills, fever and malaise/fatigue.  Eyes:  Negative for blurred vision.  Respiratory:  Negative for shortness of breath.   Cardiovascular:  Negative for chest pain.  Musculoskeletal:  Positive for joint pain and myalgias.  Neurological:  Negative for dizziness, weakness and headaches.      Objective:     BP 126/80 (BP Location: Left Arm, Patient Position: Sitting, Cuff Size: Normal)   Pulse 70  Temp 97.8 F (36.6 C) (Oral)   Wt 131 lb 4.8 oz (59.6 kg)   LMP 04/04/2012   SpO2 98%   BMI 26.52 kg/m  BP Readings from Last 3 Encounters:  09/18/23 126/80  06/10/23 132/84  04/07/23 100/72   Wt Readings from Last 3 Encounters:  09/18/23 131 lb 4.8 oz (59.6 kg)  06/10/23 134 lb 8 oz (61 kg)  04/07/23 132 lb (59.9 kg)      Physical Exam Vitals reviewed.  Constitutional:      Appearance: She is well-developed.  Eyes:     Pupils: Pupils are equal, round, and reactive to light.  Neck:     Thyroid: No thyromegaly.     Vascular: No JVD.  Cardiovascular:     Rate and Rhythm: Normal rate and regular rhythm.     Heart sounds:     No  gallop.  Pulmonary:     Effort: Pulmonary effort is normal. No respiratory distress.     Breath sounds: Normal breath sounds. No wheezing or rales.  Musculoskeletal:     Cervical back: Neck supple.     Right lower leg: No edema.     Left lower leg: No edema.  Neurological:     Mental Status: She is alert.      No results found for any visits on 09/18/23.  Last CBC Lab Results  Component Value Date   WBC 6.0 11/11/2022   HGB 12.4 11/11/2022   HCT 38.1 11/11/2022   MCV 84.5 11/11/2022   MCH 27.6 06/02/2016   RDW 14.5 11/11/2022   PLT 375.0 11/11/2022   Last metabolic panel Lab Results  Component Value Date   GLUCOSE 92 02/03/2023   NA 134 (L) 02/03/2023   K 3.9 02/03/2023   CL 100 02/03/2023   CO2 26 02/03/2023   BUN 9 02/03/2023   CREATININE 0.91 02/03/2023   GFR 73.27 02/03/2023   CALCIUM 9.5 02/03/2023   PROT 7.5 02/03/2023   ALBUMIN 4.3 02/03/2023   BILITOT 1.0 02/03/2023   ALKPHOS 62 02/03/2023   AST 18 02/03/2023   ALT 17 02/03/2023   Last lipids Lab Results  Component Value Date   CHOL 315 (H) 06/10/2023   HDL 76.90 06/10/2023   LDLCALC 211 (H) 06/10/2023   LDLDIRECT 144.0 09/02/2017   TRIG 132.0 06/10/2023   CHOLHDL 4 06/10/2023   Last hemoglobin A1c Lab Results  Component Value Date   HGBA1C 6.1 06/10/2023   Last thyroid functions Lab Results  Component Value Date   TSH 2.86 11/11/2022   T4TOTAL 10.0 09/01/2013      The 10-year ASCVD risk score (Arnett DK, et al., 2019) is: 4.1%    Assessment & Plan:   #1 type 2 diabetes.  Currently on Mounjaro 5 mg subcutaneous once weekly.  A1c's have improved and will recheck today.  Continue low glycemic diet and regular exercise  #2 hyperlipidemia.  Unfortunately, she has had some recent myalgias and joint pains which may be related to Crestor.  We recommend leaving this off for a few weeks and if symptoms do resolve with that we will consider switch to a different statin such as Lipitor.   Rechecking fasting lipid today.  Continue low saturated fat diet.  #3 history of hypertension.  She has been only taking her blood pressure medicine sporadically recently.  We recommend leaving off for now.  She has lost some weight and blood pressure may be improved as a result of that.   Return in  about 3 months (around 12/16/2023).    Evelena Peat, MD

## 2023-09-18 NOTE — Patient Instructions (Signed)
Hold the Crestor for a few weeks and let me know if myalgias and joint pains improve.

## 2023-09-26 ENCOUNTER — Other Ambulatory Visit (HOSPITAL_BASED_OUTPATIENT_CLINIC_OR_DEPARTMENT_OTHER): Payer: Self-pay

## 2023-09-26 ENCOUNTER — Other Ambulatory Visit: Payer: Self-pay | Admitting: Family Medicine

## 2023-09-28 ENCOUNTER — Other Ambulatory Visit (HOSPITAL_BASED_OUTPATIENT_CLINIC_OR_DEPARTMENT_OTHER): Payer: Self-pay

## 2023-09-28 MED ORDER — AMPHETAMINE-DEXTROAMPHETAMINE 15 MG PO TABS
15.0000 mg | ORAL_TABLET | Freq: Two times a day (BID) | ORAL | 0 refills | Status: DC
  Filled 2023-09-28: qty 60, 30d supply, fill #0

## 2023-09-28 MED ORDER — ONDANSETRON HCL 4 MG PO TABS
4.0000 mg | ORAL_TABLET | Freq: Three times a day (TID) | ORAL | 0 refills | Status: DC | PRN
  Filled 2023-09-28: qty 20, 7d supply, fill #0

## 2023-09-29 ENCOUNTER — Other Ambulatory Visit: Payer: Self-pay

## 2023-09-29 ENCOUNTER — Other Ambulatory Visit (HOSPITAL_BASED_OUTPATIENT_CLINIC_OR_DEPARTMENT_OTHER): Payer: Self-pay

## 2023-10-23 ENCOUNTER — Ambulatory Visit (HOSPITAL_BASED_OUTPATIENT_CLINIC_OR_DEPARTMENT_OTHER): Payer: Commercial Managed Care - PPO | Admitting: Obstetrics & Gynecology

## 2023-10-23 ENCOUNTER — Other Ambulatory Visit (HOSPITAL_BASED_OUTPATIENT_CLINIC_OR_DEPARTMENT_OTHER): Payer: Self-pay

## 2023-10-23 ENCOUNTER — Encounter (HOSPITAL_BASED_OUTPATIENT_CLINIC_OR_DEPARTMENT_OTHER): Payer: Self-pay | Admitting: Obstetrics & Gynecology

## 2023-10-23 VITALS — BP 129/89 | HR 73 | Ht 60.0 in | Wt 131.4 lb

## 2023-10-23 DIAGNOSIS — Z90710 Acquired absence of both cervix and uterus: Secondary | ICD-10-CM | POA: Diagnosis not present

## 2023-10-23 DIAGNOSIS — Z79890 Hormone replacement therapy (postmenopausal): Secondary | ICD-10-CM

## 2023-10-23 DIAGNOSIS — Z1211 Encounter for screening for malignant neoplasm of colon: Secondary | ICD-10-CM

## 2023-10-23 DIAGNOSIS — Z01419 Encounter for gynecological examination (general) (routine) without abnormal findings: Secondary | ICD-10-CM | POA: Diagnosis not present

## 2023-10-23 DIAGNOSIS — E1165 Type 2 diabetes mellitus with hyperglycemia: Secondary | ICD-10-CM

## 2023-10-23 MED ORDER — ESTRADIOL 0.5 MG PO TABS
0.5000 mg | ORAL_TABLET | Freq: Every day | ORAL | 4 refills | Status: AC
  Filled 2023-10-23 – 2023-11-03 (×3): qty 90, 90d supply, fill #0
  Filled 2024-01-11 – 2024-02-17 (×2): qty 90, 90d supply, fill #1
  Filled 2024-03-26 – 2024-05-20 (×3): qty 90, 90d supply, fill #2

## 2023-10-23 NOTE — Progress Notes (Signed)
 ANNUAL EXAM Patient name: Marissa Horn MRN [id number]  Date of birth: 1971-04-07 Chief Complaint:   Annual Exam  History of Present Illness:   Marissa Horn is a 53 y.o. [id number]  female being seen today for a routine annual exam.  Doing well.  Has lost weight this past year with Mounjaro.  Feeling really good.  Very interesting side effect, an essential tremor she's had for years is now gone.    Last blood work in February.  Lipids was much better but she was having a lot of symptoms with the crestor.  Will have repeat lab work in a few months.     Patient's last menstrual period was 04/04/2012.   Last pap not indicated.   Last mammogram: 02/24/23. Results were: normal. Family h/o breast cancer: no Last colonoscopy: declined.  Cologuard 2022.  Dr. Caryl Horn recommended doing a colonoscopy this year.       10/23/2023   10:59 AM 11/11/2022    2:02 PM 10/20/2022   11:21 AM 10/17/2021   10:35 AM 11/22/2019    9:06 AM  Depression screen PHQ 2/9  Decreased Interest 0 0 0 0 0  Down, Depressed, Hopeless 0 0 0 0 0  PHQ - 2 Score 0 0 0 0 0     Review of Systems:   Pertinent items are noted in HPI  Denies vaginal bleeding.  She does have some vaginal discharge.  Denies urinary issues.   Pertinent History Reviewed:  Reviewed past medical,surgical, social and family history.   Reviewed problem list, medications and allergies. Physical Assessment:   Vitals:   10/23/23 1054  BP: 129/89  Pulse: 73  Weight: 131 lb 6.4 oz (59.6 kg)  Height: 5' (1.524 m)  Body mass index is 25.66 kg/m.        Physical Examination:   General appearance - well appearing, and in no distress  Mental status - alert, oriented to person, place, and time  Psych:  She has a normal mood and affect  Skin - warm and dry, normal color, no suspicious lesions noted  Chest - effort normal, all lung fields clear to auscultation bilaterally  Heart - normal rate and regular rhythm  Neck:  midline  trachea, no thyromegaly or nodules  Breasts - breasts appear normal, no suspicious masses, no skin or nipple changes or  axillary nodes  Abdomen - soft, nontender, nondistended, no masses or organomegaly  Pelvic - VULVA: normal appearing vulva with no masses, tenderness or lesions   VAGINA: normal appearing vagina with normal color and discharge, no lesions   CERVIX: surgically absent  Thin prep pap is not done.  UTERUS: surgically absent  ADNEXA: No adnexal masses or tenderness noted.  Rectal - normal rectal, good sphincter tone, no masses felt.   Extremities:  No swelling or varicosities noted  Chaperone present for exam  Assessment & Plan:  1. Well woman exam with routine gynecological exam (Primary) - Pap smear not indicated - Mammogram 02/2023 - Colonoscopy referral placed - Bone mineral density not indicated - lab work done with PCP, Dr. Caryl Horn - vaccines reviewed/updated  2. H/O: hysterectomy  3. Colon cancer screening - Ambulatory referral to Gastroenterology  4. Hormone replacement therapy (HRT) - estradiol (ESTRACE) 0.5 MG tablet; Take 1 tablet (0.5 mg total) by mouth daily.  Dispense: 90 tablet; Refill: 4  5. Type 2 diabetes mellitus with hyperglycemia, without long-term current use of insulin (HCC) - followed by Dr. Caryl Horn   Orders Placed  This Encounter  Procedures   Ambulatory referral to Gastroenterology    Meds:  Meds ordered this encounter  Medications   estradiol (ESTRACE) 0.5 MG tablet    Sig: Take 1 tablet (0.5 mg total) by mouth daily.    Dispense:  90 tablet    Refill:  4    Follow-up: No follow-ups on file.  Valentina Shaggy, MD 10/23/2023 11:53 AM

## 2023-10-24 ENCOUNTER — Other Ambulatory Visit (HOSPITAL_BASED_OUTPATIENT_CLINIC_OR_DEPARTMENT_OTHER): Payer: Self-pay

## 2023-10-24 ENCOUNTER — Other Ambulatory Visit: Payer: Self-pay | Admitting: Family Medicine

## 2023-10-26 ENCOUNTER — Other Ambulatory Visit (HOSPITAL_BASED_OUTPATIENT_CLINIC_OR_DEPARTMENT_OTHER): Payer: Self-pay

## 2023-10-26 ENCOUNTER — Other Ambulatory Visit: Payer: Self-pay

## 2023-10-26 MED ORDER — AMPHETAMINE-DEXTROAMPHETAMINE 15 MG PO TABS
15.0000 mg | ORAL_TABLET | Freq: Two times a day (BID) | ORAL | 0 refills | Status: DC
  Filled 2023-10-26 – 2024-01-11 (×3): qty 60, 30d supply, fill #0

## 2023-10-26 MED ORDER — AMPHETAMINE-DEXTROAMPHETAMINE 15 MG PO TABS
15.0000 mg | ORAL_TABLET | Freq: Two times a day (BID) | ORAL | 0 refills | Status: DC
  Filled 2023-10-26 – 2023-10-31 (×3): qty 60, 30d supply, fill #0

## 2023-10-26 MED ORDER — ONDANSETRON HCL 4 MG PO TABS
4.0000 mg | ORAL_TABLET | Freq: Three times a day (TID) | ORAL | 0 refills | Status: DC | PRN
  Filled 2023-10-26: qty 20, 7d supply, fill #0

## 2023-10-29 ENCOUNTER — Other Ambulatory Visit (HOSPITAL_BASED_OUTPATIENT_CLINIC_OR_DEPARTMENT_OTHER): Payer: Self-pay

## 2023-10-31 ENCOUNTER — Other Ambulatory Visit (HOSPITAL_BASED_OUTPATIENT_CLINIC_OR_DEPARTMENT_OTHER): Payer: Self-pay

## 2023-11-02 ENCOUNTER — Other Ambulatory Visit: Payer: Self-pay

## 2023-11-03 ENCOUNTER — Other Ambulatory Visit (HOSPITAL_BASED_OUTPATIENT_CLINIC_OR_DEPARTMENT_OTHER): Payer: Self-pay

## 2023-11-04 ENCOUNTER — Other Ambulatory Visit (HOSPITAL_BASED_OUTPATIENT_CLINIC_OR_DEPARTMENT_OTHER): Payer: Self-pay

## 2023-11-04 ENCOUNTER — Ambulatory Visit (INDEPENDENT_AMBULATORY_CARE_PROVIDER_SITE_OTHER): Admitting: *Deleted

## 2023-11-04 DIAGNOSIS — Z23 Encounter for immunization: Secondary | ICD-10-CM | POA: Diagnosis not present

## 2023-11-28 ENCOUNTER — Other Ambulatory Visit (HOSPITAL_BASED_OUTPATIENT_CLINIC_OR_DEPARTMENT_OTHER): Payer: Self-pay

## 2023-11-28 ENCOUNTER — Other Ambulatory Visit: Payer: Self-pay | Admitting: Family Medicine

## 2023-11-28 DIAGNOSIS — E559 Vitamin D deficiency, unspecified: Secondary | ICD-10-CM

## 2023-11-30 ENCOUNTER — Other Ambulatory Visit: Payer: Self-pay

## 2023-11-30 ENCOUNTER — Other Ambulatory Visit (HOSPITAL_BASED_OUTPATIENT_CLINIC_OR_DEPARTMENT_OTHER): Payer: Self-pay

## 2023-11-30 MED ORDER — VITAMIN D (ERGOCALCIFEROL) 1.25 MG (50000 UNIT) PO CAPS
50000.0000 [IU] | ORAL_CAPSULE | ORAL | 3 refills | Status: AC
  Filled 2023-11-30 – 2024-01-11 (×2): qty 12, 84d supply, fill #0
  Filled 2024-02-17 – 2024-04-27 (×3): qty 12, 84d supply, fill #1
  Filled 2024-05-20: qty 12, 84d supply, fill #2

## 2023-12-03 ENCOUNTER — Encounter: Payer: Self-pay | Admitting: Obstetrics & Gynecology

## 2024-01-11 ENCOUNTER — Other Ambulatory Visit: Payer: Self-pay | Admitting: Family Medicine

## 2024-01-12 ENCOUNTER — Other Ambulatory Visit: Payer: Self-pay

## 2024-01-12 ENCOUNTER — Other Ambulatory Visit (HOSPITAL_BASED_OUTPATIENT_CLINIC_OR_DEPARTMENT_OTHER): Payer: Self-pay

## 2024-01-12 MED ORDER — ONDANSETRON HCL 4 MG PO TABS
4.0000 mg | ORAL_TABLET | Freq: Three times a day (TID) | ORAL | 0 refills | Status: DC | PRN
  Filled 2024-01-12: qty 20, 7d supply, fill #0

## 2024-01-12 NOTE — Telephone Encounter (Signed)
 Rx done.

## 2024-01-14 ENCOUNTER — Other Ambulatory Visit: Payer: Self-pay | Admitting: Obstetrics & Gynecology

## 2024-01-14 DIAGNOSIS — Z1231 Encounter for screening mammogram for malignant neoplasm of breast: Secondary | ICD-10-CM

## 2024-01-18 ENCOUNTER — Encounter (HOSPITAL_BASED_OUTPATIENT_CLINIC_OR_DEPARTMENT_OTHER): Payer: Self-pay | Admitting: Obstetrics & Gynecology

## 2024-01-19 ENCOUNTER — Other Ambulatory Visit (HOSPITAL_BASED_OUTPATIENT_CLINIC_OR_DEPARTMENT_OTHER): Payer: Self-pay | Admitting: Obstetrics & Gynecology

## 2024-01-19 DIAGNOSIS — Z1211 Encounter for screening for malignant neoplasm of colon: Secondary | ICD-10-CM

## 2024-02-08 ENCOUNTER — Other Ambulatory Visit (HOSPITAL_BASED_OUTPATIENT_CLINIC_OR_DEPARTMENT_OTHER): Payer: Self-pay

## 2024-02-08 ENCOUNTER — Telehealth: Payer: Self-pay | Admitting: Internal Medicine

## 2024-02-08 ENCOUNTER — Other Ambulatory Visit: Payer: Self-pay | Admitting: Family Medicine

## 2024-02-08 DIAGNOSIS — J4520 Mild intermittent asthma, uncomplicated: Secondary | ICD-10-CM

## 2024-02-08 MED ORDER — BUDESONIDE-FORMOTEROL FUMARATE 80-4.5 MCG/ACT IN AERO
2.0000 | INHALATION_SPRAY | Freq: Two times a day (BID) | RESPIRATORY_TRACT | 2 refills | Status: DC
  Filled 2024-02-17: qty 10.2, 30d supply, fill #0
  Filled 2024-04-27: qty 10.2, 30d supply, fill #1
  Filled 2024-06-12: qty 10.2, 30d supply, fill #2

## 2024-02-08 NOTE — Telephone Encounter (Signed)
 Good afternoon Dr. Avram I have this patient calling to request that she be seen with a female provider her at our office. Patient stated that she would like to be about to schedule an appointment for a colonoscopy with Dr. Federico. Patient was last seen with you 5 years ago. Would you please advise. Thank you.

## 2024-02-08 NOTE — Telephone Encounter (Signed)
 Would like to accommodate her.  I have copied Dr. Federico to see if she is able to except the patient.

## 2024-02-09 ENCOUNTER — Encounter: Payer: Self-pay | Admitting: Internal Medicine

## 2024-02-17 ENCOUNTER — Other Ambulatory Visit: Payer: Self-pay | Admitting: Family Medicine

## 2024-02-17 ENCOUNTER — Other Ambulatory Visit (HOSPITAL_BASED_OUTPATIENT_CLINIC_OR_DEPARTMENT_OTHER): Payer: Self-pay

## 2024-02-17 ENCOUNTER — Other Ambulatory Visit: Payer: Self-pay

## 2024-02-17 MED ORDER — ONDANSETRON HCL 4 MG PO TABS
4.0000 mg | ORAL_TABLET | Freq: Three times a day (TID) | ORAL | 0 refills | Status: DC | PRN
Start: 2024-02-17 — End: 2024-05-04
  Filled 2024-02-17: qty 20, 7d supply, fill #0

## 2024-02-17 MED ORDER — AMPHETAMINE-DEXTROAMPHETAMINE 15 MG PO TABS
15.0000 mg | ORAL_TABLET | Freq: Two times a day (BID) | ORAL | 0 refills | Status: DC
  Filled 2024-02-17: qty 60, 30d supply, fill #0

## 2024-02-17 MED ORDER — AMPHETAMINE-DEXTROAMPHETAMINE 15 MG PO TABS
15.0000 mg | ORAL_TABLET | Freq: Two times a day (BID) | ORAL | 0 refills | Status: DC
  Filled 2024-02-17 – 2024-03-26 (×2): qty 60, 30d supply, fill #0

## 2024-02-24 ENCOUNTER — Other Ambulatory Visit (HOSPITAL_BASED_OUTPATIENT_CLINIC_OR_DEPARTMENT_OTHER): Payer: Self-pay

## 2024-02-24 ENCOUNTER — Other Ambulatory Visit: Payer: Self-pay | Admitting: Family Medicine

## 2024-02-24 DIAGNOSIS — I10 Essential (primary) hypertension: Secondary | ICD-10-CM

## 2024-02-24 MED ORDER — AMLODIPINE BESYLATE 5 MG PO TABS
5.0000 mg | ORAL_TABLET | Freq: Every day | ORAL | 1 refills | Status: DC
  Filled 2024-03-26: qty 90, 90d supply, fill #0
  Filled 2024-04-27 – 2024-06-17 (×4): qty 90, 90d supply, fill #1

## 2024-02-25 ENCOUNTER — Ambulatory Visit
Admission: RE | Admit: 2024-02-25 | Discharge: 2024-02-25 | Disposition: A | Discharge status: Home / Self Care | Source: Ambulatory Visit | Attending: Obstetrics & Gynecology | Admitting: Obstetrics & Gynecology

## 2024-02-25 DIAGNOSIS — Z1231 Encounter for screening mammogram for malignant neoplasm of breast: Secondary | ICD-10-CM

## 2024-03-16 ENCOUNTER — Ambulatory Visit: Admitting: *Deleted

## 2024-03-16 ENCOUNTER — Other Ambulatory Visit (HOSPITAL_BASED_OUTPATIENT_CLINIC_OR_DEPARTMENT_OTHER): Payer: Self-pay

## 2024-03-16 ENCOUNTER — Encounter: Payer: Self-pay | Admitting: Internal Medicine

## 2024-03-16 VITALS — Ht 60.0 in | Wt 122.0 lb

## 2024-03-16 DIAGNOSIS — Z1211 Encounter for screening for malignant neoplasm of colon: Secondary | ICD-10-CM

## 2024-03-16 MED ORDER — NA SULFATE-K SULFATE-MG SULF 17.5-3.13-1.6 GM/177ML PO SOLN
1.0000 | Freq: Once | ORAL | 0 refills | Status: AC
  Filled 2024-03-16 (×2): qty 354, 1d supply, fill #0

## 2024-03-16 NOTE — Progress Notes (Signed)
 Pre visit completed over telephone. Instructions forwarded through active MyChart.    No egg or soy allergy known to patient  No issues known to pt with past sedation with any surgeries or procedures Patient denies ever being told they had issues or difficulty with intubation  No FH of Malignant Hyperthermia Pt is not on diet pills Pt is not on  home 02  Pt is not on blood thinners  Pt denies issues with constipation  No A fib or A flutter Have any cardiac testing pending-- NO  Pt instructed to use Singlecare.com or GoodRx for a price reduction on prep      Chart reviewed by DOROTHA Schillings, CRNA

## 2024-03-26 ENCOUNTER — Other Ambulatory Visit (HOSPITAL_BASED_OUTPATIENT_CLINIC_OR_DEPARTMENT_OTHER): Payer: Self-pay

## 2024-03-29 ENCOUNTER — Other Ambulatory Visit (HOSPITAL_BASED_OUTPATIENT_CLINIC_OR_DEPARTMENT_OTHER): Payer: Self-pay

## 2024-03-30 ENCOUNTER — Ambulatory Visit: Admitting: Internal Medicine

## 2024-03-30 ENCOUNTER — Encounter: Payer: Self-pay | Admitting: Internal Medicine

## 2024-03-30 VITALS — BP 142/76 | HR 58 | Temp 97.5°F | Resp 12 | Ht 60.0 in | Wt 122.0 lb

## 2024-03-30 DIAGNOSIS — K648 Other hemorrhoids: Secondary | ICD-10-CM

## 2024-03-30 DIAGNOSIS — Z1211 Encounter for screening for malignant neoplasm of colon: Secondary | ICD-10-CM

## 2024-03-30 DIAGNOSIS — I10 Essential (primary) hypertension: Secondary | ICD-10-CM | POA: Diagnosis not present

## 2024-03-30 DIAGNOSIS — D122 Benign neoplasm of ascending colon: Secondary | ICD-10-CM | POA: Diagnosis not present

## 2024-03-30 DIAGNOSIS — K5730 Diverticulosis of large intestine without perforation or abscess without bleeding: Secondary | ICD-10-CM

## 2024-03-30 DIAGNOSIS — E785 Hyperlipidemia, unspecified: Secondary | ICD-10-CM | POA: Diagnosis not present

## 2024-03-30 DIAGNOSIS — D123 Benign neoplasm of transverse colon: Secondary | ICD-10-CM

## 2024-03-30 MED ORDER — SODIUM CHLORIDE 0.9 % IV SOLN: 500.0000 mL | Freq: Once | INTRAVENOUS | Status: DC

## 2024-03-30 NOTE — Op Note (Signed)
 [facility] Patient Name: Marissa Horn Procedure Date: 03/30/2024 8:16 AM MRN: [id number] Endoscopist: Rosario Estefana Kidney , , [id number] Age: 53 Referring MD:  Date of Birth: Jan 10, 1971 Gender: Female Account #: [id number] Procedure:                Colonoscopy Indications:              Screening for colorectal malignant neoplasm, This                            is the patient's first colonoscopy Medicines:                Monitored Anesthesia Care Procedure:                Pre-Anesthesia Assessment:                           - Prior to the procedure, a History and Physical                            was performed, and patient medications and                            allergies were reviewed. The patient's tolerance of                            previous anesthesia was also reviewed. The risks                            and benefits of the procedure and the sedation                            options and risks were discussed with the patient.                            All questions were answered, and informed consent                            was obtained. Prior Anticoagulants: The patient has                            taken no anticoagulant or antiplatelet agents. ASA                            Grade Assessment: II - A patient with mild systemic                            disease. After reviewing the risks and benefits,                            the patient was deemed in satisfactory condition to                            undergo the procedure.  After obtaining informed consent, the colonoscope                            was passed under direct vision. Throughout the                            procedure, the patient's blood pressure, pulse, and                            oxygen saturations were monitored continuously. The                            Olympus Scope SN: [id number] was introduced through                            the anus and  advanced to the the terminal ileum.                            The colonoscopy was performed without difficulty.                            The patient tolerated the procedure well. The                            quality of the bowel preparation was excellent. The                            terminal ileum, ileocecal valve, appendiceal                            orifice, and rectum were photographed. Scope In: 8:19:28 AM Scope Out: 8:36:22 AM Scope Withdrawal Time: 0 hours 13 minutes 42 seconds  Total Procedure Duration: 0 hours 16 minutes 54 seconds  Findings:                 The terminal ileum appeared normal.                           Three sessile polyps were found in the transverse                            colon and ascending colon. The polyps were 3 to 6                            mm in size. These polyps were removed with a cold                            snare. Resection and retrieval were complete.                           Multiple diverticula were found in the sigmoid                            colon, descending colon, transverse colon  and                            ascending colon.                           Non-bleeding internal hemorrhoids were found during                            retroflexion. Complications:            No immediate complications. Estimated Blood Loss:     Estimated blood loss was minimal. Impression:               - The examined portion of the ileum was normal.                           - Three 3 to 6 mm polyps in the transverse colon                            and in the ascending colon, removed with a cold                            snare. Resected and retrieved.                           - Diverticulosis in the sigmoid colon, in the                            descending colon, in the transverse colon and in                            the ascending colon.                           - Non-bleeding internal hemorrhoids. Recommendation:           -  Discharge patient to home (with escort).                           - Await pathology results.                           - The findings and recommendations were discussed                            with the patient. Dr Estefana Federico Rosario Estefana Federico,  03/30/2024 8:43:12 AM

## 2024-03-30 NOTE — Progress Notes (Signed)
 Report to PACU, RN, vss, BBS= Clear.

## 2024-03-30 NOTE — Patient Instructions (Addendum)
-  Handout on polyps and diverticulosis provided -await pathology results -repeat colonoscopy for surveillance recommended. Date to be determined when pathology result become available   -Continue present medications   YOU HAD AN ENDOSCOPIC PROCEDURE TODAY AT THE [facility]:   Refer to the procedure report that was given to you for any specific questions about what was found during the examination.  If the procedure report does not answer your questions, please call your gastroenterologist to clarify.  If you requested that your care partner not be given the details of your procedure findings, then the procedure report has been included in a sealed envelope for you to review at your convenience later.  YOU SHOULD EXPECT: Some feelings of bloating in the abdomen. Passage of more gas than usual.  Walking can help get rid of the air that was put into your GI tract during the procedure and reduce the bloating. If you had a lower endoscopy (such as a colonoscopy or flexible sigmoidoscopy) you may notice spotting of blood in your stool or on the toilet paper. If you underwent a bowel prep for your procedure, you may not have a normal bowel movement for a few days.  Please Note:  You might notice some irritation and congestion in your nose or some drainage.  This is from the oxygen used during your procedure.  There is no need for concern and it should clear up in a day or so.  SYMPTOMS TO REPORT IMMEDIATELY:  Following lower endoscopy (colonoscopy or flexible sigmoidoscopy):  Excessive amounts of blood in the stool  Significant tenderness or worsening of abdominal pains  Swelling of the abdomen that is new, acute  Fever of 100F or higher   For urgent or emergent issues, a gastroenterologist can be reached at any hour by calling [phone]. Do not use MyChart messaging for urgent concerns.    DIET:  We do recommend a small meal at first, but then you may proceed to your regular  diet.  Drink plenty of fluids but you should avoid alcoholic beverages for 24 hours.  ACTIVITY:  You should plan to take it easy for the rest of today and you should NOT DRIVE or use heavy machinery until tomorrow (because of the sedation medicines used during the test).    FOLLOW UP: Our staff will call the number listed on your records the next business day following your procedure.  We will call around 7:15- 8:00 am to check on you and address any questions or concerns that you may have regarding the information given to you following your procedure. If we do not reach you, we will leave a message.     If any biopsies were taken you will be contacted by phone or by letter within the next 1-3 weeks.  Please call us at [phone] if you have not heard about the biopsies in 3 weeks.    SIGNATURES/CONFIDENTIALITY: You and/or your care partner have signed paperwork which will be entered into your electronic medical record.  These signatures attest to the fact that that the information above on your After Visit Summary has been reviewed and is understood.  Full responsibility of the confidentiality of this discharge information lies with you and/or your care-partner.   

## 2024-03-30 NOTE — Progress Notes (Signed)
 GASTROENTEROLOGY PROCEDURE H&P NOTE   Primary Care Physician: Micheal Wolm ORN, MD    Reason for Procedure:   Colon cancer screening  Plan:    Colonoscopy  Patient is appropriate for endoscopic procedure(s) in the ambulatory ([facility]) setting.  The nature of the procedure, as well as the risks, benefits, and alternatives were carefully and thoroughly reviewed with the patient. Ample time for discussion and questions allowed. The patient understood, was satisfied, and agreed to proceed.     HPI: Marissa Horn is a 53 y.o. female who presents for colonoscopy for colon cancer screening. Denies blood in stools, changes in bowel habits, or unintentional weight loss. Denies family history of colon cancer.   Past Medical History:  Diagnosis Date   Adult ADHD    Allergy    Arthritis    hands   Asthma    ?, inhaler use as needed   Complication of anesthesia 1990   muscle relaxant caused lung to collapse   Difficult intubation 1990   Dyslipidemia    History of chicken pox    Hypertension    IBS (irritable bowel syndrome)    ?   Migraine     Past Surgical History:  Procedure Laterality Date   ABDOMINOPLASTY  05/04/2012   Procedure: ABDOMINOPLASTY;  Surgeon: Alm Sick, MD;  Location: [facility];  Service: Plastics;  Laterality: N/A;   ANAL FISSURE REPAIR     age 58   EYE SURGERY Left    age 75   GYNECOLOGIC CRYOSURGERY     laparoscopic hysterectomy  05/04/2012   WISDOM TOOTH EXTRACTION      Prior to Admission medications   Medication Sig Start Date End Date Taking? Authorizing Provider  amLODipine  (NORVASC ) 5 MG tablet Take 1 tablet (5 mg total) by mouth daily. 02/24/24   Burchette, Wolm ORN, MD  amphetamine -dextroamphetamine  (ADDERALL) 15 MG tablet Take 1 tablet by mouth 2 (two) times daily. 02/17/24   Burchette, Wolm ORN, MD  amphetamine -dextroamphetamine  (ADDERALL) 15 MG tablet Take 1 tablet by mouth 2 (two) times daily. 02/17/24   Burchette, Wolm ORN, MD   budesonide -formoterol  (SYMBICORT ) 80-4.5 MCG/ACT inhaler Inhale 2 puffs into the lungs 2 (two) times daily. 02/08/24   Burchette, Wolm ORN, MD  estradiol  (ESTRACE ) 0.5 MG tablet Take 1 tablet (0.5 mg total) by mouth daily. 10/23/23 10/22/24  Cleotilde Ronal RAMAN, MD  ketotifen  (ZADITOR ) 0.025 % ophthalmic solution Place 1 drop into both eyes 2 (two) times daily. 07/14/22   Burchette, Wolm ORN, MD  levalbuterol  (XOPENEX  HFA) 45 MCG/ACT inhaler Inhale 1-2 puffs into the lungs every 6 (six) hours as needed for wheezing 06/12/23 06/11/24  Burchette, Wolm ORN, MD  levalbuterol  (XOPENEX ) 1.25 MG/3ML nebulizer solution Inhale 1 vial (1.25 mg) into the lungs using nebulizer every 4 (four) hours as needed for wheezing 07/14/22   Burchette, Wolm ORN, MD  mometasone  (ELOCON ) 0.1 % cream APPLY TO THE AFFECTED AREA(S) DAILY AS NEEDED 05/23/21   Burchette, Wolm ORN, MD  NEEDLE, DISP, 25 G (BD ECLIPSE NEEDLE) 25G X 5/8 MISC Use as directed. 07/25/15   Burchette, Wolm ORN, MD  ondansetron  (ZOFRAN ) 4 MG tablet Take 1 tablet (4 mg total) by mouth every 8 (eight) hours as needed for nausea or vomiting. 02/17/24   Burchette, Wolm ORN, MD  tirzepatide  (MOUNJARO ) 5 MG/0.5ML Pen Inject 5 mg into the skin once a week. 06/10/23   Burchette, Wolm ORN, MD  Vitamin D , Ergocalciferol , (DRISDOL ) 1.25 MG (50000 UNIT) CAPS capsule Take 1  capsule (50,000 Units total) by mouth once a week. 11/30/23   Burchette, Wolm ORN, MD    Current Outpatient Medications  Medication Sig Dispense Refill   amLODipine  (NORVASC ) 5 MG tablet Take 1 tablet (5 mg total) by mouth daily. 90 tablet 1   amphetamine -dextroamphetamine  (ADDERALL) 15 MG tablet Take 1 tablet by mouth 2 (two) times daily. 60 tablet 0   amphetamine -dextroamphetamine  (ADDERALL) 15 MG tablet Take 1 tablet by mouth 2 (two) times daily. 60 tablet 0   budesonide -formoterol  (SYMBICORT ) 80-4.5 MCG/ACT inhaler Inhale 2 puffs into the lungs 2 (two) times daily. 10.2 g 2   estradiol  (ESTRACE ) 0.5 MG tablet  Take 1 tablet (0.5 mg total) by mouth daily. 90 tablet 4   ketotifen  (ZADITOR ) 0.025 % ophthalmic solution Place 1 drop into both eyes 2 (two) times daily. 5 mL 0   levalbuterol  (XOPENEX  HFA) 45 MCG/ACT inhaler Inhale 1-2 puffs into the lungs every 6 (six) hours as needed for wheezing 15 g 1   levalbuterol  (XOPENEX ) 1.25 MG/3ML nebulizer solution Inhale 1 vial (1.25 mg) into the lungs using nebulizer every 4 (four) hours as needed for wheezing 75 mL 1   mometasone  (ELOCON ) 0.1 % cream APPLY TO THE AFFECTED AREA(S) DAILY AS NEEDED 45 g 11   NEEDLE, DISP, 25 G (BD ECLIPSE NEEDLE) 25G X 5/8 MISC Use as directed. 10 each 5   ondansetron  (ZOFRAN ) 4 MG tablet Take 1 tablet (4 mg total) by mouth every 8 (eight) hours as needed for nausea or vomiting. 20 tablet 0   tirzepatide  (MOUNJARO ) 5 MG/0.5ML Pen Inject 5 mg into the skin once a week. 6 mL 3   Vitamin D , Ergocalciferol , (DRISDOL ) 1.25 MG (50000 UNIT) CAPS capsule Take 1 capsule (50,000 Units total) by mouth once a week. 12 capsule 3   Current Facility-Administered Medications  Medication Dose Route Frequency Provider Last Rate Last Admin   0.9 %  sodium chloride  infusion  500 mL Intravenous Once Federico Rosario BROCKS, MD        Allergies as of 03/30/2024 - Review Complete 03/16/2024  Allergen Reaction Noted   Scopolamine Anaphylaxis 01/14/2012   Vyvanse  [lisdexamfetamine dimesylate ]  04/19/2018   Bactrim  [sulfamethoxazole -trimethoprim ]  06/02/2016   Biaxin [clarithromycin] Nausea And Vomiting 01/14/2012   Tamiflu [oseltamivir phosphate] Nausea And Vomiting 01/14/2012   Cephalexin Nausea And Vomiting 07/30/2013   Asa [aspirin] Rash 01/14/2012    Family History  Problem Relation Age of Onset   Colon polyps Mother    Arthritis Mother    Hyperlipidemia Mother    Glaucoma Mother    Irritable bowel syndrome Mother    Cataracts Mother    Hyperlipidemia Father    Heart disease Father 33       CAD   Cataracts Father    Thyroid  disease Sister     Hyperlipidemia Brother    Ulcerative colitis Brother    Colon cancer Neg Hx    Esophageal cancer Neg Hx    Rectal cancer Neg Hx    Stomach cancer Neg Hx     Social History   Socioeconomic History   Marital status: Married    Spouse name: Not on file   Number of children: 2   Years of education: Not on file   Highest education level: Bachelor's degree (e.g., BA, AB, BS)  Occupational History   Occupation: clinical dietitian  Tobacco Use   Smoking status: Never   Smokeless tobacco: Never  Vaping Use   Vaping status: Never Used  Substance  and Sexual Activity   Alcohol use: No   Drug use: No   Sexual activity: Yes    Partners: Male    Birth control/protection: Surgical    Comment: hysterectomy  Other Topics Concern   Not on file  Social History Narrative   Married, 2 sons. Husband is a pediatrician.      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 Approximately one maximum caffeinated beverages daily.   Social Drivers of Corporate investment banker Strain: Low Risk  (09/16/2023)   Overall Financial Resource Strain (CARDIA)    Difficulty of Paying Living Expenses: Not hard at all  Food Insecurity: No Food Insecurity (09/16/2023)   Hunger Vital Sign    Worried About Running Out of Food in the Last Year: Never true    Ran Out of Food in the Last Year: Never true  Transportation Needs: No Transportation Needs (09/16/2023)   PRAPARE - Administrator, Civil Service (Medical): No    Lack of Transportation (Non-Medical): No  Physical Activity: Insufficiently Active (09/16/2023)   Exercise Vital Sign    Days of Exercise per Week: 2 days    Minutes of Exercise per Session: 20 min  Stress: No Stress Concern Present (09/16/2023)   [employer] of Occupational Health - Occupational Stress Questionnaire    Feeling of Stress : Only a little  Social Connections: Moderately Integrated (09/16/2023)   Social Connection and Isolation Panel    Frequency of  Communication with Friends and Family: Once a week    Frequency of Social Gatherings with Friends and Family: Once a week    Attends Religious Services: 1 to 4 times per year    Active Member of [employer] or Organizations: Yes    Attends Engineer, structural: More than 4 times per year    Marital Status: Married  Catering manager Violence: Not on file    Physical Exam: Vital signs in last 24 hours: BP 111/63   Pulse 66   Temp (!) 97.5 F (36.4 C) (Skin)   Ht 5' (1.524 m)   Wt 122 lb (55.3 kg)   LMP 04/04/2012   SpO2 98%   BMI 23.83 kg/m  GEN: NAD EYE: Sclerae anicteric ENT: MMM CV: Non-tachycardic Pulm: No increased work of breathing GI: Soft, NT/ND NEURO:  Alert & Oriented   Estefana Kidney, MD [facility]  03/30/2024 8:07 AM

## 2024-03-30 NOTE — Progress Notes (Addendum)
Pt. states no medical or surgical changes since previsit or office visit. 

## 2024-03-30 NOTE — Progress Notes (Signed)
 Called to room to assist during endoscopic procedure.  Patient ID and intended procedure confirmed with present staff. Received instructions for my participation in the procedure from the performing physician.

## 2024-03-31 ENCOUNTER — Telehealth: Payer: Self-pay

## 2024-03-31 NOTE — Telephone Encounter (Signed)
 Attempted to reach patient for follow up phone call. No answer, left voicemail to contact Dr. Lafonda office with any questions or concerns.

## 2024-04-01 LAB — SURGICAL PATHOLOGY

## 2024-04-05 ENCOUNTER — Ambulatory Visit: Payer: Self-pay | Admitting: Internal Medicine

## 2024-04-27 ENCOUNTER — Other Ambulatory Visit (HOSPITAL_BASED_OUTPATIENT_CLINIC_OR_DEPARTMENT_OTHER): Payer: Self-pay

## 2024-04-28 ENCOUNTER — Other Ambulatory Visit: Payer: Self-pay

## 2024-04-30 ENCOUNTER — Other Ambulatory Visit (HOSPITAL_BASED_OUTPATIENT_CLINIC_OR_DEPARTMENT_OTHER): Payer: Self-pay

## 2024-05-03 ENCOUNTER — Other Ambulatory Visit: Payer: Self-pay

## 2024-05-03 ENCOUNTER — Other Ambulatory Visit (HOSPITAL_BASED_OUTPATIENT_CLINIC_OR_DEPARTMENT_OTHER): Payer: Self-pay

## 2024-05-03 ENCOUNTER — Encounter: Payer: Self-pay | Admitting: Family Medicine

## 2024-05-03 ENCOUNTER — Ambulatory Visit: Admitting: Family Medicine

## 2024-05-03 VITALS — BP 100/70 | HR 84 | Temp 97.7°F | Wt 119.3 lb

## 2024-05-03 DIAGNOSIS — Z23 Encounter for immunization: Secondary | ICD-10-CM | POA: Diagnosis not present

## 2024-05-03 DIAGNOSIS — Z7985 Long-term (current) use of injectable non-insulin antidiabetic drugs: Secondary | ICD-10-CM

## 2024-05-03 DIAGNOSIS — I10 Essential (primary) hypertension: Secondary | ICD-10-CM | POA: Diagnosis not present

## 2024-05-03 DIAGNOSIS — E1165 Type 2 diabetes mellitus with hyperglycemia: Secondary | ICD-10-CM

## 2024-05-03 DIAGNOSIS — E785 Hyperlipidemia, unspecified: Secondary | ICD-10-CM | POA: Diagnosis not present

## 2024-05-03 LAB — POCT GLYCOSYLATED HEMOGLOBIN (HGB A1C): Hemoglobin A1C: 5.8 % — AB (ref 4.0–5.6)

## 2024-05-03 MED ORDER — ATORVASTATIN CALCIUM 10 MG PO TABS
10.0000 mg | ORAL_TABLET | Freq: Every day | ORAL | 3 refills | Status: AC
  Filled 2024-05-03 – 2024-05-20 (×2): qty 90, 90d supply, fill #0
  Filled 2024-06-12: qty 90, 90d supply, fill #1

## 2024-05-03 MED ORDER — TIRZEPATIDE 2.5 MG/0.5ML ~~LOC~~ SOAJ
2.5000 mg | SUBCUTANEOUS | 3 refills | Status: DC
  Filled 2024-05-03: qty 2, 28d supply, fill #0
  Filled 2024-05-04: qty 6, 84d supply, fill #0
  Filled 2024-05-20: qty 2, 28d supply, fill #0
  Filled 2024-06-12: qty 2, 28d supply, fill #1
  Filled 2024-06-17: qty 6, 84d supply, fill #1

## 2024-05-03 NOTE — Progress Notes (Signed)
 Established Patient Office Visit  Subjective   Patient ID: Marissa Horn, female    DOB: 1971-07-26  Age: 53 y.o. MRN: [id number]  Chief Complaint  Patient presents with   Medical Management of Chronic Issues    HPI    Marissa Horn is seen today for chronic medical follow-up.  She has history of diabetes and is currently on Mounjaro  5 mg subcutaneous once weekly and has had tremendous response.  Her weight is down another 12 pounds from last visit.  She does feel overall she may feel somewhat weak and fatigued on the 5 mg dose and would like to back this down to 2.5 mg.  Her A1c today is greatly improved to 5.8%.  This was 6.2% last visit.  She is very pleased with the result of that.  She has not hyperlipidemia and family history of CAD in her father but she had recent coronary calcium  score of 0.  She had very high lipids with cholesterol over 300.  We started Crestor  but she developed some myalgias which did resolve after stopping the Crestor .  She would like to consider another statin versus PCSK9 inhibitor at this time.  Hypertension history and had stopped taking amlodipine  recently until she had some increased situational stress and had blood pressure 1 day 200 systolic and cannot recall her diastolic reading.  Blood pressures have been much improved since starting back the amlodipine .  She watches sodium intake closely.  Past Medical History:  Diagnosis Date   Adult ADHD    Allergy    Arthritis    hands   Asthma    ?, inhaler use as needed   Complication of anesthesia 1990   muscle relaxant caused lung to collapse   Difficult intubation 1990   Dyslipidemia    History of chicken pox    Hypertension    IBS (irritable bowel syndrome)    ?   Migraine    Past Surgical History:  Procedure Laterality Date   ABDOMINOPLASTY  05/04/2012   Procedure: ABDOMINOPLASTY;  Surgeon: Alm Sick, MD;  Location: [facility];  Service: Plastics;  Laterality: N/A;   ANAL FISSURE REPAIR      age 82   EYE SURGERY Left    age 44   GYNECOLOGIC CRYOSURGERY     laparoscopic hysterectomy  05/04/2012   WISDOM TOOTH EXTRACTION      reports that she has never smoked. She has never used smokeless tobacco. She reports that she does not drink alcohol and does not use drugs. family history includes Arthritis in her mother; Cataracts in her father and mother; Colon polyps in her mother; Glaucoma in her mother; Heart disease (age of onset: 3) in her father; Hyperlipidemia in her brother, father, and mother; Irritable bowel syndrome in her mother; Thyroid  disease in her sister; Ulcerative colitis in her brother. Allergies  Allergen Reactions   Scopolamine Anaphylaxis   Vyvanse  [Lisdexamfetamine Dimesylate ]     Severe migraine   Bactrim  [Sulfamethoxazole -Trimethoprim ]     Joint pain, fever   Biaxin [Clarithromycin] Nausea And Vomiting    Projectile vomiting   Tamiflu [Oseltamivir Phosphate] Nausea And Vomiting    Projectile vomiting   Asa [Aspirin] Rash    Pt takes ibuprofen without problems   Cephalexin Nausea And Vomiting    Review of Systems  Constitutional:  Negative for malaise/fatigue.  Eyes:  Negative for blurred vision.  Respiratory:  Negative for shortness of breath.   Cardiovascular:  Negative for chest pain.  Gastrointestinal:  Negative  for nausea and vomiting.  Neurological:  Negative for dizziness, weakness and headaches.      Objective:     BP 100/70   Pulse 84   Temp 97.7 F (36.5 C) (Oral)   Wt 119 lb 4.8 oz (54.1 kg)   LMP 04/04/2012   SpO2 98%   BMI 23.30 kg/m  BP Readings from Last 3 Encounters:  05/03/24 100/70  03/30/24 (!) 142/76  10/23/23 129/89   Wt Readings from Last 3 Encounters:  05/03/24 119 lb 4.8 oz (54.1 kg)  03/30/24 122 lb (55.3 kg)  03/16/24 122 lb (55.3 kg)      Physical Exam Vitals reviewed.  Constitutional:      General: She is not in acute distress.    Appearance: She is not ill-appearing.  Cardiovascular:     Rate  and Rhythm: Normal rate and regular rhythm.  Pulmonary:     Effort: Pulmonary effort is normal.     Breath sounds: Normal breath sounds. No wheezing or rales.  Musculoskeletal:     Right lower leg: No edema.     Left lower leg: No edema.  Neurological:     Mental Status: She is alert.      Results for orders placed or performed in visit on 05/03/24  POC HgB A1c  Result Value Ref Range   Hemoglobin A1C 5.8 (A) 4.0 - 5.6 %   HbA1c POC (<> result, manual entry)     HbA1c, POC (prediabetic range)     HbA1c, POC (controlled diabetic range)      Last CBC Lab Results  Component Value Date   WBC 6.0 11/11/2022   HGB 12.4 11/11/2022   HCT 38.1 11/11/2022   MCV 84.5 11/11/2022   MCH 27.6 06/02/2016   RDW 14.5 11/11/2022   PLT 375.0 11/11/2022   Last metabolic panel Lab Results  Component Value Date   GLUCOSE 92 02/03/2023   NA 134 (L) 02/03/2023   K 3.9 02/03/2023   CL 100 02/03/2023   CO2 26 02/03/2023   BUN 9 02/03/2023   CREATININE 0.91 02/03/2023   GFR 73.27 02/03/2023   CALCIUM  9.5 02/03/2023   PROT 7.4 09/18/2023   ALBUMIN 4.5 09/18/2023   BILITOT 1.1 09/18/2023   ALKPHOS 50 09/18/2023   AST 17 09/18/2023   ALT 14 09/18/2023   Last lipids Lab Results  Component Value Date   CHOL 188 09/18/2023   HDL 64.50 09/18/2023   LDLCALC 98 09/18/2023   LDLDIRECT 144.0 09/02/2017   TRIG 127.0 09/18/2023   CHOLHDL 3 09/18/2023   Last hemoglobin A1c Lab Results  Component Value Date   HGBA1C 5.8 (A) 05/03/2024   Last thyroid  functions Lab Results  Component Value Date   TSH 2.86 11/11/2022   T4TOTAL 10.0 09/01/2013      The 10-year ASCVD risk score (Arnett DK, et al., 2019) is: 1.8%    Assessment & Plan:   #1 history of type 2 diabetes.  Patient currently on Mounjaro  but has had some fatigue recently would like to try backing down her dose to 5 mg.  Her A1c today is improved to 5.8%.  Set up 12-month follow-up and repeat A1c then.  We sent in new dose of  Mounjaro  2.5 mg subcutaneous weekly  #2 hyperlipidemia.  Recent coronary calcium  score of 0.  Intolerance with Crestor  with myalgias.  Will try another statin with atorvastatin 10 mg daily and future lab order for lipid and hepatic panel in about 2 months.  If intolerant of Lipitor consider PCSK9 inhibitor  #3 hypertension.  Currently very well-controlled.  She had recent spike of 200 systolic.  Improved since going back on amlodipine  5 mg daily.  Continue low-sodium diet.  #4 health maintenance.  Recommend flu vaccine and patient consents.  This was given today   Return in about 3 months (around 08/02/2024).    Wolm Scarlet, MD

## 2024-05-04 ENCOUNTER — Other Ambulatory Visit (HOSPITAL_BASED_OUTPATIENT_CLINIC_OR_DEPARTMENT_OTHER): Payer: Self-pay

## 2024-05-04 ENCOUNTER — Other Ambulatory Visit: Payer: Self-pay | Admitting: Family Medicine

## 2024-05-05 ENCOUNTER — Other Ambulatory Visit (HOSPITAL_BASED_OUTPATIENT_CLINIC_OR_DEPARTMENT_OTHER): Payer: Self-pay

## 2024-05-05 ENCOUNTER — Other Ambulatory Visit: Payer: Self-pay

## 2024-05-05 MED ORDER — ONDANSETRON HCL 4 MG PO TABS
4.0000 mg | ORAL_TABLET | Freq: Three times a day (TID) | ORAL | 0 refills | Status: AC | PRN
  Filled 2024-05-05 – 2024-05-20 (×2): qty 20, 7d supply, fill #0

## 2024-05-05 MED ORDER — AMPHETAMINE-DEXTROAMPHETAMINE 15 MG PO TABS
15.0000 mg | ORAL_TABLET | Freq: Two times a day (BID) | ORAL | 0 refills | Status: DC
  Filled 2024-05-05 – 2024-06-12 (×2): qty 60, 30d supply, fill #0

## 2024-05-13 ENCOUNTER — Other Ambulatory Visit (HOSPITAL_BASED_OUTPATIENT_CLINIC_OR_DEPARTMENT_OTHER): Payer: Self-pay

## 2024-05-16 ENCOUNTER — Other Ambulatory Visit (HOSPITAL_BASED_OUTPATIENT_CLINIC_OR_DEPARTMENT_OTHER): Payer: Self-pay

## 2024-05-20 ENCOUNTER — Other Ambulatory Visit (HOSPITAL_BASED_OUTPATIENT_CLINIC_OR_DEPARTMENT_OTHER): Payer: Self-pay

## 2024-05-20 ENCOUNTER — Other Ambulatory Visit: Payer: Self-pay

## 2024-05-25 ENCOUNTER — Other Ambulatory Visit (HOSPITAL_BASED_OUTPATIENT_CLINIC_OR_DEPARTMENT_OTHER): Payer: Self-pay

## 2024-06-12 ENCOUNTER — Other Ambulatory Visit (HOSPITAL_BASED_OUTPATIENT_CLINIC_OR_DEPARTMENT_OTHER): Payer: Self-pay

## 2024-06-13 ENCOUNTER — Other Ambulatory Visit: Payer: Self-pay

## 2024-06-16 ENCOUNTER — Other Ambulatory Visit (HOSPITAL_BASED_OUTPATIENT_CLINIC_OR_DEPARTMENT_OTHER): Payer: Self-pay

## 2024-06-17 ENCOUNTER — Other Ambulatory Visit (HOSPITAL_BASED_OUTPATIENT_CLINIC_OR_DEPARTMENT_OTHER): Payer: Self-pay

## 2024-06-21 ENCOUNTER — Telehealth: Payer: Self-pay

## 2024-06-21 ENCOUNTER — Other Ambulatory Visit (HOSPITAL_COMMUNITY): Payer: Self-pay

## 2024-06-21 NOTE — Telephone Encounter (Signed)
 Pharmacy Patient Advocate Encounter   Received notification from Pt Calls Messages that prior authorization for Mounjaro  5 is required/requested.   Insurance verification completed.   The patient is insured through [facility].   Per test claim: PA required; PA started via CoverMyMeds. KEY [id number] . Waiting for clinical questions to populate.

## 2024-06-21 NOTE — Telephone Encounter (Signed)
 Patient is needing PA for Mounjaro  5mg   Key: [id number]  Patient name: Marissa Horn  DOB: [birth year]

## 2024-06-21 NOTE — Telephone Encounter (Signed)
 Clinical questions have been answered and PA submitted. PA currently Pending. Please be advised that most companies allow up to 30 days to make a decision. We will advise when a determination has been made, or follow up in 1 week.   Please reach out to our team, Rx Prior Auth Pool, if you haven't heard back in a week.

## 2024-06-24 ENCOUNTER — Other Ambulatory Visit

## 2024-06-24 ENCOUNTER — Other Ambulatory Visit (HOSPITAL_COMMUNITY): Payer: Self-pay

## 2024-06-24 NOTE — Telephone Encounter (Signed)
 Additional information has been requested from the patient's insurance in order to proceed with the prior authorization request. Requested information has been sent, or form has been filled out and faxed back to [phone]

## 2024-06-27 ENCOUNTER — Other Ambulatory Visit (HOSPITAL_COMMUNITY): Payer: Self-pay

## 2024-06-28 ENCOUNTER — Other Ambulatory Visit (HOSPITAL_COMMUNITY): Payer: Self-pay

## 2024-06-28 NOTE — Telephone Encounter (Signed)
 Pharmacy Patient Advocate Encounter  Received notification from [employer] that Prior Authorization for Mounjaro  5 has been DENIED.  Full denial letter will be uploaded to the media tab. See denial reason below.     PA #/Case ID/Reference #: # [id number]

## 2024-06-29 ENCOUNTER — Other Ambulatory Visit

## 2024-07-04 ENCOUNTER — Ambulatory Visit: Admitting: Family Medicine

## 2024-07-04 ENCOUNTER — Other Ambulatory Visit (HOSPITAL_BASED_OUTPATIENT_CLINIC_OR_DEPARTMENT_OTHER): Payer: Self-pay

## 2024-07-04 ENCOUNTER — Ambulatory Visit: Payer: Self-pay | Admitting: Family Medicine

## 2024-07-04 ENCOUNTER — Other Ambulatory Visit: Payer: Self-pay

## 2024-07-04 VITALS — BP 116/86 | HR 78 | Temp 97.8°F | Ht 60.0 in | Wt 125.3 lb

## 2024-07-04 DIAGNOSIS — E1165 Type 2 diabetes mellitus with hyperglycemia: Secondary | ICD-10-CM | POA: Diagnosis not present

## 2024-07-04 DIAGNOSIS — E785 Hyperlipidemia, unspecified: Secondary | ICD-10-CM

## 2024-07-04 DIAGNOSIS — F909 Attention-deficit hyperactivity disorder, unspecified type: Secondary | ICD-10-CM | POA: Diagnosis not present

## 2024-07-04 DIAGNOSIS — J4520 Mild intermittent asthma, uncomplicated: Secondary | ICD-10-CM

## 2024-07-04 DIAGNOSIS — Z7985 Long-term (current) use of injectable non-insulin antidiabetic drugs: Secondary | ICD-10-CM | POA: Diagnosis not present

## 2024-07-04 DIAGNOSIS — I10 Essential (primary) hypertension: Secondary | ICD-10-CM

## 2024-07-04 LAB — MICROALBUMIN / CREATININE URINE RATIO
Creatinine,U: 277.6 mg/dL
Microalb Creat Ratio: 7 mg/g (ref 0.0–30.0)
Microalb, Ur: 1.9 mg/dL (ref 0.0–1.9)

## 2024-07-04 LAB — HEPATIC FUNCTION PANEL
ALT: 12 U/L (ref 0–35)
AST: 15 U/L (ref 0–37)
Albumin: 4.5 g/dL (ref 3.5–5.2)
Alkaline Phosphatase: 50 U/L (ref 39–117)
Bilirubin, Direct: 0.1 mg/dL (ref 0.0–0.3)
Total Bilirubin: 0.9 mg/dL (ref 0.2–1.2)
Total Protein: 7.2 g/dL (ref 6.0–8.3)

## 2024-07-04 LAB — LIPID PANEL
Cholesterol: 198 mg/dL (ref 0–200)
HDL: 68.1 mg/dL (ref >39.00)
LDL Cholesterol: 108 mg/dL — ABNORMAL HIGH (ref 0–99)
NonHDL: 129.74
Total CHOL/HDL Ratio: 3
Triglycerides: 107 mg/dL (ref 0.0–149.0)
VLDL: 21.4 mg/dL (ref 0.0–40.0)

## 2024-07-04 MED ORDER — AMPHETAMINE-DEXTROAMPHETAMINE 10 MG PO TABS
10.0000 mg | ORAL_TABLET | Freq: Two times a day (BID) | ORAL | 0 refills | Status: AC
  Filled 2024-07-04: qty 60, 30d supply, fill #0

## 2024-07-04 MED ORDER — TIRZEPATIDE 2.5 MG/0.5ML ~~LOC~~ SOAJ
2.5000 mg | SUBCUTANEOUS | 3 refills | Status: AC
  Filled 2024-07-04: qty 6, 84d supply, fill #0

## 2024-07-04 MED ORDER — BUDESONIDE-FORMOTEROL FUMARATE 80-4.5 MCG/ACT IN AERO
2.0000 | INHALATION_SPRAY | Freq: Two times a day (BID) | RESPIRATORY_TRACT | 2 refills | Status: AC
  Filled 2024-07-04: qty 10.2, 30d supply, fill #0

## 2024-07-04 MED ORDER — TRETINOIN 0.025 % EX CREA
TOPICAL_CREAM | Freq: Every day | CUTANEOUS | 0 refills | Status: AC
  Filled 2024-07-04: qty 45, 90d supply, fill #0

## 2024-07-04 MED ORDER — MOMETASONE FUROATE 0.1 % EX CREA
TOPICAL_CREAM | CUTANEOUS | 11 refills | Status: AC
  Filled 2024-07-04: qty 45, 90d supply, fill #0

## 2024-07-04 MED ORDER — AMLODIPINE BESYLATE 5 MG PO TABS
5.0000 mg | ORAL_TABLET | Freq: Every day | ORAL | 1 refills | Status: AC
  Filled 2024-07-04: qty 90, 90d supply, fill #0

## 2024-07-04 NOTE — Progress Notes (Signed)
 Established Patient Office Visit  Subjective   Patient ID: Marissa Horn, female    DOB: 1971/01/03  Age: 53 y.o. MRN: [id number]  Chief Complaint  Patient presents with   Medical Management of Chronic Issues   Medication Refill    HPI   Marissa Horn is seen today for medical follow-up to address the following items  She has history of type 2 diabetes.  She did extremely well with Mounjaro  and in fact felt like 5 mg was too much.  We reduced her dosage to 2.5 mg which she feels like is working well.  In combination with diligence with diet she has been able to maintain her weight fairly well with home weights around 121 pounds.  She has no nausea or other side effects with low-dose Mounjaro .  Recent A1c improved at 5.8%.  She had previous intolerance with rosuvastatin .  Positive family history of CAD.  She had previous coronary calcium  score 0.  We started Lipitor 10 mg daily and she has been out of tolerate this without side effects.  She has ADD history currently on Adderall 15 mg twice daily.  She feels like this dose may be slightly high for her.  She would like to try a lower dose to see if she is can still maintain good focus with less medication.  Past Medical History:  Diagnosis Date   Adult ADHD    Allergy    Arthritis    hands   Asthma    ?, inhaler use as needed   Complication of anesthesia 1990   muscle relaxant caused lung to collapse   Difficult intubation 1990   Dyslipidemia    History of chicken pox    Hypertension    IBS (irritable bowel syndrome)    ?   Migraine    Past Surgical History:  Procedure Laterality Date   ABDOMINOPLASTY  05/04/2012   Procedure: ABDOMINOPLASTY;  Surgeon: Alm Sick, MD;  Location: [facility];  Service: Plastics;  Laterality: N/A;   ANAL FISSURE REPAIR     age 33   EYE SURGERY Left    age 52   GYNECOLOGIC CRYOSURGERY     laparoscopic hysterectomy  05/04/2012   WISDOM TOOTH EXTRACTION      reports that she has never  smoked. She has never used smokeless tobacco. She reports that she does not drink alcohol and does not use drugs. family history includes Arthritis in her mother; Cataracts in her father and mother; Colon polyps in her mother; Glaucoma in her mother; Heart disease (age of onset: 55) in her father; Hyperlipidemia in her brother, father, and mother; Irritable bowel syndrome in her mother; Thyroid  disease in her sister; Ulcerative colitis in her brother. Allergies  Allergen Reactions   Scopolamine Anaphylaxis   Vyvanse  [Lisdexamfetamine Dimesylate ]     Severe migraine   Bactrim  [Sulfamethoxazole -Trimethoprim ]     Joint pain, fever   Biaxin [Clarithromycin] Nausea And Vomiting    Projectile vomiting   Tamiflu [Oseltamivir Phosphate] Nausea And Vomiting    Projectile vomiting   Asa [Aspirin] Rash    Pt takes ibuprofen without problems   Cephalexin Nausea And Vomiting    Review of Systems  Constitutional:  Negative for malaise/fatigue.  Eyes:  Negative for blurred vision.  Respiratory:  Negative for shortness of breath.   Cardiovascular:  Negative for chest pain.  Neurological:  Negative for dizziness, weakness and headaches.      Objective:     BP 116/86 (BP Location: Right Arm,  Patient Position: Sitting, Cuff Size: Normal)   Pulse 78   Temp 97.8 F (36.6 C) (Oral)   Ht 5' (1.524 m)   Wt 125 lb 4.8 oz (56.8 kg)   LMP 04/04/2012   SpO2 99%   BMI 24.47 kg/m  BP Readings from Last 3 Encounters:  07/04/24 116/86  05/03/24 100/70  03/30/24 (!) 142/76   Wt Readings from Last 3 Encounters:  07/04/24 125 lb 4.8 oz (56.8 kg)  05/03/24 119 lb 4.8 oz (54.1 kg)  03/30/24 122 lb (55.3 kg)      Physical Exam Vitals reviewed.  Constitutional:      General: She is not in acute distress.    Appearance: She is well-developed. She is not ill-appearing.  Eyes:     Pupils: Pupils are equal, round, and reactive to light.  Neck:     Thyroid : No thyromegaly.     Vascular: No JVD.   Cardiovascular:     Rate and Rhythm: Normal rate and regular rhythm.     Heart sounds:     No gallop.  Pulmonary:     Effort: Pulmonary effort is normal. No respiratory distress.     Breath sounds: Normal breath sounds. No wheezing or rales.  Musculoskeletal:     Cervical back: Neck supple.     Right lower leg: No edema.     Left lower leg: No edema.  Skin:    Comments: Feet reveal no skin lesions. Good distal foot pulses. Good capillary refill. No calluses. Normal sensation with monofilament testing   Neurological:     Mental Status: She is alert.      No results found for any visits on 07/04/24.  Last CBC Lab Results  Component Value Date   WBC 6.0 11/11/2022   HGB 12.4 11/11/2022   HCT 38.1 11/11/2022   MCV 84.5 11/11/2022   MCH 27.6 06/02/2016   RDW 14.5 11/11/2022   PLT 375.0 11/11/2022   Last metabolic panel Lab Results  Component Value Date   GLUCOSE 92 02/03/2023   NA 134 (L) 02/03/2023   K 3.9 02/03/2023   CL 100 02/03/2023   CO2 26 02/03/2023   BUN 9 02/03/2023   CREATININE 0.91 02/03/2023   GFR 73.27 02/03/2023   CALCIUM  9.5 02/03/2023   PROT 7.4 09/18/2023   ALBUMIN 4.5 09/18/2023   BILITOT 1.1 09/18/2023   ALKPHOS 50 09/18/2023   AST 17 09/18/2023   ALT 14 09/18/2023   Last hemoglobin A1c Lab Results  Component Value Date   HGBA1C 5.8 (A) 05/03/2024      The 10-year ASCVD risk score (Arnett DK, et al., 2019) is: 2.5%    Assessment & Plan:   #1 type 2 diabetes controlled with recent A1c 5.8%.  Continue Mounjaro  2.5 mg subcutaneous once weekly.  She has been able to maintain weight at this dosage.  Checking urine microalbumin today.  Continue yearly diabetic eye exam  #2 hypertension stable and controlled currently on amlodipine  5 mg daily  #3 hyperlipidemia.  Recent initiation of Lipitor 10 mg daily.  Check lipid and CMP today  #4 ADD.  Patient requesting reduction of dose of Adderall.  Will try reducing to 10 mg twice daily with new  prescription sent.  Give feedback in 1 month  #5 history of asthma.  Stable on Symbicort .  Refill sent.  Flu vaccine already given.  Do need to address pneumonia vaccine at follow-up   Return in about 3 months (around 10/02/2024).  Wolm Scarlet, MD

## 2024-07-04 NOTE — Telephone Encounter (Signed)
Pt was seen at the office today

## 2024-07-08 DIAGNOSIS — H5213 Myopia, bilateral: Secondary | ICD-10-CM | POA: Diagnosis not present

## 2024-08-03 ENCOUNTER — Ambulatory Visit: Admitting: Orthopaedic Surgery

## 2024-08-03 ENCOUNTER — Encounter: Payer: Self-pay | Admitting: Orthopaedic Surgery

## 2024-08-03 DIAGNOSIS — M7711 Lateral epicondylitis, right elbow: Secondary | ICD-10-CM

## 2024-08-03 MED ORDER — METHYLPREDNISOLONE ACETATE 40 MG/ML IJ SUSP
40.0000 mg | INTRAMUSCULAR | Status: AC | PRN
  Administered 2024-08-03: 40 mg

## 2024-08-03 MED ORDER — LIDOCAINE HCL 1 % IJ SOLN
2.0000 mL | INTRAMUSCULAR | Status: AC | PRN
  Administered 2024-08-03: 2 mL

## 2024-08-03 NOTE — Progress Notes (Signed)
 The patient is a very pleasant 53 year old female who comes in with right elbow pain has been bothering her for several months now.  She does have a history of left lateral epicondylitis and Dr. Anderson placed an injection around the lateral epicondyle of the left elbow back in 2020 and that did very well for her.  She is left-hand dominant.  She does not recall any type of injury and does do repetitive activities.  She is a data processing manager.  She is otherwise pretty healthy.  Examination of her right elbow does show pain that is consistent over the lateral epicondyle of the elbow.  The elbow is ligaments stable with full range of motion.  There is a little bit of triceps pain as well.  Her clinical exam is more consistent with tendinitis and lateral epicondylitis.  Since she has had a steroid injection on the left side it did so well we did recommend a steroid injection on the right side and she agreed to this and tolerated well.  I did show her stretching exercises to try.  Our next step would be sending her to Dr. Burnetta for soft tissue treatments if this does not calm down.  She understands that as well.  Follow-up otherwise is as needed.    Procedure Note  Patient: Marissa Horn             Date of Birth: February 10, 1971           MRN: [id number]             Visit Date: 08/03/2024  Procedures: Visit Diagnoses:  1. Lateral epicondylitis, right elbow     Hand/UE Inj: R elbow for lateral epicondylitis on 08/03/2024 10:18 AM Medications: 2 mL lidocaine  1 %; 40 mg methylPREDNISolone  acetate 40 MG/ML

## 2024-10-03 ENCOUNTER — Ambulatory Visit: Admitting: Family Medicine

## 2024-10-24 ENCOUNTER — Ambulatory Visit (HOSPITAL_BASED_OUTPATIENT_CLINIC_OR_DEPARTMENT_OTHER): Admitting: Obstetrics & Gynecology
